# Patient Record
Sex: Male | Born: 1950
Health system: Southern US, Community
[De-identification: ages and names within clinical notes are randomized; demographics above are authoritative.]

## PROBLEM LIST (undated history)

## (undated) DIAGNOSIS — E119 Type 2 diabetes mellitus without complications: Secondary | ICD-10-CM

## (undated) DIAGNOSIS — I509 Heart failure, unspecified: Secondary | ICD-10-CM

## (undated) DIAGNOSIS — J449 Chronic obstructive pulmonary disease, unspecified: Secondary | ICD-10-CM

## (undated) DIAGNOSIS — I5020 Unspecified systolic (congestive) heart failure: Secondary | ICD-10-CM

## (undated) DIAGNOSIS — I83009 Varicose veins of unspecified lower extremity with ulcer of unspecified site: Secondary | ICD-10-CM

## (undated) DIAGNOSIS — N183 Chronic kidney disease, stage 3 unspecified: Secondary | ICD-10-CM

## (undated) DIAGNOSIS — U071 COVID-19: Secondary | ICD-10-CM

## (undated) DIAGNOSIS — L97909 Non-pressure chronic ulcer of unspecified part of unspecified lower leg with unspecified severity: Secondary | ICD-10-CM

## (undated) DIAGNOSIS — I2510 Atherosclerotic heart disease of native coronary artery without angina pectoris: Secondary | ICD-10-CM

## (undated) DIAGNOSIS — I447 Left bundle-branch block, unspecified: Secondary | ICD-10-CM

## (undated) HISTORY — PX: OTHER SURGICAL HISTORY: SHX169

## (undated) HISTORY — DX: Heart failure, unspecified: I50.9

---

## 2006-08-08 ENCOUNTER — Other Ambulatory Visit: Payer: Self-pay

## 2006-08-08 ENCOUNTER — Observation Stay: Payer: Self-pay | Admitting: Internal Medicine

## 2008-10-20 ENCOUNTER — Emergency Department: Payer: Self-pay | Admitting: Internal Medicine

## 2013-10-27 ENCOUNTER — Observation Stay: Payer: Self-pay | Admitting: Internal Medicine

## 2013-10-27 LAB — COMPREHENSIVE METABOLIC PANEL
ALBUMIN: 4 g/dL (ref 3.4–5.0)
ANION GAP: 9 (ref 7–16)
Alkaline Phosphatase: 120 U/L — ABNORMAL HIGH
BUN: 30 mg/dL — ABNORMAL HIGH (ref 7–18)
Bilirubin,Total: 0.3 mg/dL (ref 0.2–1.0)
CHLORIDE: 104 mmol/L (ref 98–107)
Calcium, Total: 9.7 mg/dL (ref 8.5–10.1)
Co2: 26 mmol/L (ref 21–32)
Creatinine: 2.12 mg/dL — ABNORMAL HIGH (ref 0.60–1.30)
EGFR (African American): 38 — ABNORMAL LOW
EGFR (Non-African Amer.): 33 — ABNORMAL LOW
Glucose: 116 mg/dL — ABNORMAL HIGH (ref 65–99)
OSMOLALITY: 285 (ref 275–301)
POTASSIUM: 4 mmol/L (ref 3.5–5.1)
SGOT(AST): 36 U/L (ref 15–37)
SGPT (ALT): 20 U/L (ref 12–78)
Sodium: 139 mmol/L (ref 136–145)
Total Protein: 8.1 g/dL (ref 6.4–8.2)

## 2013-10-27 LAB — URINALYSIS, COMPLETE
BILIRUBIN, UR: NEGATIVE
Bacteria: NONE SEEN
Hyaline Cast: 3
KETONE: NEGATIVE
LEUKOCYTE ESTERASE: NEGATIVE
NITRITE: NEGATIVE
PH: 7 (ref 4.5–8.0)
Protein: 100
RBC,UR: <1 /HPF (ref 0–5)
Specific Gravity: 1.018 (ref 1.003–1.030)
Squamous Epithelial: NONE SEEN
WBC UR: <1 /HPF (ref 0–5)

## 2013-10-27 LAB — CBC WITH DIFFERENTIAL/PLATELET
BASOS ABS: 0.1 10*3/uL (ref 0.0–0.1)
Basophil %: 0.5 %
Eosinophil #: 0.1 10*3/uL (ref 0.0–0.7)
Eosinophil %: 1.2 %
HCT: 41.7 % (ref 40.0–52.0)
HGB: 13.4 g/dL (ref 13.0–18.0)
LYMPHS PCT: 13 %
Lymphocyte #: 1.6 10*3/uL (ref 1.0–3.6)
MCH: 30.6 pg (ref 26.0–34.0)
MCHC: 32.1 g/dL (ref 32.0–36.0)
MCV: 95 fL (ref 80–100)
MONOS PCT: 6.3 %
Monocyte #: 0.8 x10 3/mm (ref 0.2–1.0)
Neutrophil #: 9.6 10*3/uL — ABNORMAL HIGH (ref 1.4–6.5)
Neutrophil %: 79 %
Platelet: 274 10*3/uL (ref 150–440)
RBC: 4.37 10*6/uL — ABNORMAL LOW (ref 4.40–5.90)
RDW: 13.2 % (ref 11.5–14.5)
WBC: 12.1 10*3/uL — ABNORMAL HIGH (ref 3.8–10.6)

## 2013-10-27 LAB — PROTIME-INR
INR: 0.9
PROTHROMBIN TIME: 11.9 s (ref 11.5–14.7)

## 2013-10-27 LAB — CK TOTAL AND CKMB (NOT AT ARMC)
CK, TOTAL: 177 U/L
CK-MB: 8.1 ng/mL — AB (ref 0.5–3.6)

## 2013-10-27 LAB — TROPONIN I

## 2013-10-28 LAB — CBC WITH DIFFERENTIAL/PLATELET
BASOS ABS: 0.1 10*3/uL (ref 0.0–0.1)
BASOS PCT: 0.9 %
EOS PCT: 3.5 %
Eosinophil #: 0.3 10*3/uL (ref 0.0–0.7)
HCT: 38.5 % — AB (ref 40.0–52.0)
HGB: 12.8 g/dL — AB (ref 13.0–18.0)
LYMPHS ABS: 2.3 10*3/uL (ref 1.0–3.6)
Lymphocyte %: 31.2 %
MCH: 31.6 pg (ref 26.0–34.0)
MCHC: 33.2 g/dL (ref 32.0–36.0)
MCV: 95 fL (ref 80–100)
MONOS PCT: 8.9 %
Monocyte #: 0.6 x10 3/mm (ref 0.2–1.0)
NEUTROS ABS: 4 10*3/uL (ref 1.4–6.5)
NEUTROS PCT: 55.5 %
PLATELETS: 221 10*3/uL (ref 150–440)
RBC: 4.05 10*6/uL — AB (ref 4.40–5.90)
RDW: 13 % (ref 11.5–14.5)
WBC: 7.2 10*3/uL (ref 3.8–10.6)

## 2013-10-28 LAB — BASIC METABOLIC PANEL
ANION GAP: 7 (ref 7–16)
BUN: 33 mg/dL — ABNORMAL HIGH (ref 7–18)
CALCIUM: 9 mg/dL (ref 8.5–10.1)
CHLORIDE: 106 mmol/L (ref 98–107)
CO2: 26 mmol/L (ref 21–32)
Creatinine: 1.82 mg/dL — ABNORMAL HIGH (ref 0.60–1.30)
EGFR (Non-African Amer.): 39 — ABNORMAL LOW
GFR CALC AF AMER: 46 — AB
GLUCOSE: 127 mg/dL — AB (ref 65–99)
OSMOLALITY: 286 (ref 275–301)
POTASSIUM: 4.5 mmol/L (ref 3.5–5.1)
Sodium: 139 mmol/L (ref 136–145)

## 2013-10-28 LAB — HEMOGLOBIN A1C: HEMOGLOBIN A1C: 8 % — AB (ref 4.2–6.3)

## 2014-01-07 ENCOUNTER — Inpatient Hospital Stay: Payer: Self-pay | Admitting: Internal Medicine

## 2014-01-07 LAB — CBC
HCT: 41.5 % (ref 40.0–52.0)
HGB: 13.4 g/dL (ref 13.0–18.0)
MCH: 31 pg (ref 26.0–34.0)
MCHC: 32.2 g/dL (ref 32.0–36.0)
MCV: 96 fL (ref 80–100)
Platelet: 279 10*3/uL (ref 150–440)
RBC: 4.3 10*6/uL — ABNORMAL LOW (ref 4.40–5.90)
RDW: 12.8 % (ref 11.5–14.5)
WBC: 13.6 10*3/uL — ABNORMAL HIGH (ref 3.8–10.6)

## 2014-01-07 LAB — COMPREHENSIVE METABOLIC PANEL
ALK PHOS: 129 U/L — AB
ALT: 21 U/L (ref 12–78)
ANION GAP: 6 — AB (ref 7–16)
Albumin: 4 g/dL (ref 3.4–5.0)
BUN: 22 mg/dL — ABNORMAL HIGH (ref 7–18)
Bilirubin,Total: 0.2 mg/dL (ref 0.2–1.0)
CHLORIDE: 104 mmol/L (ref 98–107)
CREATININE: 1.95 mg/dL — AB (ref 0.60–1.30)
Calcium, Total: 9.4 mg/dL (ref 8.5–10.1)
Co2: 29 mmol/L (ref 21–32)
EGFR (African American): 42 — ABNORMAL LOW
EGFR (Non-African Amer.): 36 — ABNORMAL LOW
Glucose: 114 mg/dL — ABNORMAL HIGH (ref 65–99)
OSMOLALITY: 282 (ref 275–301)
POTASSIUM: 4.3 mmol/L (ref 3.5–5.1)
SGOT(AST): 31 U/L (ref 15–37)
Sodium: 139 mmol/L (ref 136–145)
TOTAL PROTEIN: 8.4 g/dL — AB (ref 6.4–8.2)

## 2014-01-07 LAB — URINALYSIS, COMPLETE
BILIRUBIN, UR: NEGATIVE
Bacteria: NONE SEEN
Blood: NEGATIVE
Glucose,UR: >=500 mg/dL (ref 0–75)
Ketone: NEGATIVE
LEUKOCYTE ESTERASE: NEGATIVE
NITRITE: NEGATIVE
PH: 6 (ref 4.5–8.0)
Protein: >=500
Specific Gravity: 1.015 (ref 1.003–1.030)
Squamous Epithelial: NONE SEEN

## 2014-01-07 LAB — TROPONIN I

## 2014-01-07 LAB — TSH: Thyroid Stimulating Horm: 7.87 u[IU]/mL — ABNORMAL HIGH

## 2014-01-07 LAB — T4, FREE: Free Thyroxine: 1.08 ng/dL (ref 0.76–1.46)

## 2014-01-08 LAB — BASIC METABOLIC PANEL
Anion Gap: 5 — ABNORMAL LOW (ref 7–16)
BUN: 23 mg/dL — ABNORMAL HIGH (ref 7–18)
CHLORIDE: 105 mmol/L (ref 98–107)
Calcium, Total: 8.6 mg/dL (ref 8.5–10.1)
Co2: 26 mmol/L (ref 21–32)
Creatinine: 1.92 mg/dL — ABNORMAL HIGH (ref 0.60–1.30)
GFR CALC AF AMER: 43 — AB
GFR CALC NON AF AMER: 37 — AB
GLUCOSE: 391 mg/dL — AB (ref 65–99)
Osmolality: 292 (ref 275–301)
POTASSIUM: 4.5 mmol/L (ref 3.5–5.1)
Sodium: 136 mmol/L (ref 136–145)

## 2014-01-12 LAB — CULTURE, BLOOD (SINGLE)

## 2014-10-19 NOTE — Discharge Summary (Signed)
PATIENT NAME:  Bryce Garcia, Bryce Garcia MR#:  [id number] DATE OF BIRTH:  1951-02-06  DATE OF ADMISSION:  10/27/2013 DATE OF DISCHARGE:  10/28/2013  DISCHARGE DIAGNOSES:  1.  Hypoglycemia, resolved.  2.  Diabetes mellitus, type 2. 3.  Hypertension.  4.  Peripheral vascular disease. 5.  Chronic kidney disease stage III.  DISCHARGE MEDICATIONS: 1.  Aspirin 81 mg p.o. daily. 2.  Synthroid 50 mcg p.o. daily. 3.  Cymbalta 60 mg p.o. daily. 4.  Wellbutrin 150 mg p.o. daily. 5.  Enalapril 20 mg 1 tablet p.o. b.i.d.  6.  Furosemide 20 mg p.o. daily. 7.  Humalog according to sliding scale with coverage.   DIET: Low-sodium, low-fat, ADA.  CONSULTANTS: None.  HOSPITAL COURSE:  1.  This is a 64 year old male patient with history of hypertension and diabetes mellitus type 2 came in because of hypoglycemia. The patient's blood sugar was 30 when he was brought in. The patient was brought in by the wife because he was unresponsive. The patient was sitting in the recliner and he became unresponsive. By the time EMS went there, his sugar was in the 30s. Because of that, the patient came to the Emergency Room. He had a CAT scan of abdomen and pelvis which showed no abnormal changes, and the patient admitted because of hypoglycemia. The patient did not have any nausea or vomiting. He takes Novolin N at home twice a day. The patient says that he has labile blood glucose levels. He has this problem all the time, but he had hypoglycemia which persisted so we admitted him to hospitalist service, started on D5 half normal saline, and checked the Accu-Cheks every 4 hours. He was not on Lantus, but on admission Lantus was mentioned in the history and physical, but he told me that he is not on Lantus. He takes Novolin N. So anyway we did not give any long-acting insulin while he was here. The patient's blood sugars were followed closely. The patient's sugars improved and we stopped the IV fluids from D5 and then the patient's  sugar jumped up all the way to like 400s. He required some sliding scale coverage, and the patient's sugars were as high as 600, so we had to cover him with short-acting insulin. The patient also received a dose of Lantus by night physician. He received a dose of Lantus the second night. He received 12 units. The patient developed hypoglycemia with sugar of 18. That happened after 2 hours of getting Lantus. The patient's hypoglycemia quickly corrected and he improved, hypoglycemia resolved, and repeat blood sugars were like 160s. I told the patient that he needs to follow up with endocrinologist to have correct regimen for his diabetes. According to him, he has been having these labile sugars on and off. They will go either high or very low, so I told him to do sliding scale coverage alone and see the primary doctor, Dr. Deborra Medina, and then also see Dr. Lavone Orn, and see if the patient needs further adjustment of his medications.  2.  Acute on chronic renal failure. The patient's creatinine was 2.12 and BUN 30 on admission. He received IV fluids. At the time of discharge, the patient's BUN and creatinine, BUN was 33 and creatinine was 1.82. The patient denies any other complaints.  DISCHARGE PHYSICAL EXAMINATION:  VITAL SIGNS: On the day of discharge, blood pressure was 177/80, pulse 74, temperature 98. The patient's blood pressure has been around 120/60s to 150/70s. CARDIOVASCULAR: S1, S2 regular.  LUNGS:  Clear to auscultation. No wheeze noted.  ABDOMEN: Soft, nontender, and nondistended. Bowel sounds normal. EXTREMITIES: No edema.   The patient was stable for discharge. We will discharge him home.   TIME SPENT: More than 30 minutes.   ____________________________ Epifanio Lesches, MD sk:sb D: 10/29/2013 13:30:00 ET T: 10/29/2013 13:51:14 ET JOB#: [id number]  cc: Epifanio Lesches, MD, <Dictator> Epifanio Lesches MD ELECTRONICALLY SIGNED 11/12/2013 12:08

## 2014-10-19 NOTE — H&P (Signed)
PATIENT NAME:  Bryce Garcia, DEUTSCHER MR#:  [id number] DATE OF BIRTH:  Nov 06, 1950  DATE OF ADMISSION:  01/07/2014  PRIMARY CARE PHYSICIAN: Dr. Verner Chol at [facility].   CHIEF COMPLAINT: Syncope.   HISTORY OF PRESENT ILLNESS: This is a 64 year old male who was found at the gas station with his foot on the accelerator, passed out in his car. EMS was called by the bystander who found him, where he was noted to have a blood sugar of 38. Of note, he was here in May with a similar episode of hypoglycemia. He is currently seeing an endocrinologist at [facility]. He is on sliding scale insulin per carbohydrates. The patient was noted to have hypothermia upon arrival, temperature was 93 upon arrival to the ER. He now has a bear hugger on. His blood sugars after an amp of D50 did elevate; however, they keep fluctuating and now his blood sugars are in the 60s. He is currently on a D5 drip.   REVIEW OF SYSTEMS: CONSTITUTIONAL: No fever although here he was noted to have hypothermia. No fatigue, weakness.  EYES: No blurred or double vision, glaucoma or cataract. ENT: No ear pain, hearing loss, snoring, postnasal drip.  RESPIRATORY:  No cough, wheezing, hemoptysis, chronic obstructive pulmonary disease.  CARDIOVASCULAR: No chest pain, palpitations. Positive syncope. No edema, arrhythmia, dyspnea on exertion.  GASTROINTESTINAL: No nausea, vomiting, diarrhea, abdominal pain, melena or ulcers.  GENITOURINARY: No dysuria or hematuria.  ENDOCRINE:  No polyuria, polydipsia. HEMATOLOGY AND LYMPHATIC:  No bleeding or swollen glands.  SKIN: No rash or lesions.   MUSCULOSKELETAL: No limited activity. NEUROLOGICAL:  Positive history of cerebrovascular accident.  No history of transient ischemic attack or seizures.   PSYCHIATRIC:  No history of anxiety or depression.   PAST MEDICAL HISTORY: 1.  Diabetes.  2.  History of hypothyroidism.  3.  Coronary artery disease, status post bypass.  4.  History of cerebrovascular accident affecting  the left side of body.  5.  Herniated disk.  6.  Osteoarthritis.   ALLERGIES: No known drug allergies.   MEDICATIONS:  1.  Aspirin 81 mg daily.  2.  Synthroid 50 mcg daily.  3.  Cymbalta 60 mg daily.  4.  Wellbutrin 150 mg daily.  5.  Enalapril 20 mg b.i.d.  6.  Lasix 20 mg daily.  7.  Humalog sliding scale.   SOCIAL HISTORY: No tobacco, alcohol or drug use.   FAMILY HISTORY: Positive for diabetes, hypertension.   PHYSICAL EXAMINATION: VITAL SIGNS: Temperature 95.8, pulse 66, respirations 19, blood pressure 147/58, 100% on room air.  GENERAL: The patient is alert, oriented, not in acute distress. HEENT: Head is atraumatic. Pupils are round and reactive. Sclerae anicteric. Mucous membranes are moist. Oropharynx is clear. NECK: Supple without JVD, carotid bruit or enlarged thyroid.  HEART: Regular rate and rhythm. No murmurs, gallops or rubs. PMI is not displaced. LUNGS: Clear to auscultation without crackles, rales, rhonchi or wheezing. Normal to percussion.  ABDOMEN: Bowel sounds present. Nontender, nondistended. No hepatosplenomegaly. No rebound or guarding.   EXTREMITIES: No clubbing, cyanosis or edema.  SKIN: He has got bilateral red chronic erythema of his lower extremities. This is not new.  NEUROLOGICAL:  Cranial nerves II through XII are intact.  There are no focal deficits.   LABORATORY, DIAGNOSTIC AND RADIOLOGICAL DATA:   1.  Sodium 139, potassium 4.3, chloride 104, bicarbonate 29, BUN 22, creatinine 1.95, glucose 64 currently on D5.  2.  Total protein 8.4, albumin 4.0, bilirubin 0.2, alkaline phosphatase 129,  AST 31, ALT 21.  3.  Troponin less than 0.02.  4.  TSH 7.87, T4 is 1.08.  5.  White blood cells 13, hemoglobin 13.4, hematocrit 42, platelets are 279.  6.  Lactic acid is 2.8.   ASSESSMENT AND PLAN: This is a 64 year old male who presents with hypothermia and hypoglycemia after a syncopal episode.   1.  Syncope secondary to hypoglycemia as outlined below.  2.   Syncope. The patient was noted to have a blood sugar of 38 which contributed to his syncopal episode. He is on D5 drip.  Will continue to monitor his blood sugars q.2 hours for now. Once his blood sugars stabilize, we can wean him off the D5.  3.  Chronic renal insufficiency. Patient is right around baseline creatinine, stage 3 at 1.95.  4.  Hypothermia, likely secondary to hypoglycemia. The patient does have elevated lactic acid which may be indicative of sepsis. We will empirically place the patient on Rocephin. I have asked the ER physician to order some blood cultures. Continue with the bear hugger and will follow up on the urinalysis to evaluate for any infection.  5.  Hypothyroidism. The patient's TSH is elevated however, free T4 is normal. Will continue Synthroid 50 mcg daily.  6.  History of coronary artery disease. We will continue with aspirin as well as use ACE inhibitor.  7.  The patient is full CODE STATUS.   TIME SPENT: Approximately 40 minutes.    ____________________________ Donell Beers. Benjie Karvonen, MD spm:cs D: 01/07/2014 19:01:00 ET T: 01/07/2014 19:29:29 ET JOB#: [id number]  cc: Shareena Nusz P. Benjie Karvonen, MD, <Dictator> Dr. Verner Chol at [place] Minette Manders MD ELECTRONICALLY SIGNED 01/08/2014 11:33

## 2014-10-19 NOTE — Discharge Summary (Signed)
PATIENT NAME:  Bryce Garcia, POPKO MR#:  [id number] DATE OF BIRTH:  21-Nov-1950  DATE OF ADMISSION:  01/07/2014. DATE OF DISCHARGE:  01/08/2014.  DISCHARGE DIAGNOSES:  1.  Syncope secondary to hypoglycemia, resolved.  2.  Hypertension.  3.  Hypothyroidism.  4.  Depression.   DISCHARGE MEDICATIONS: Lasix 20 mg p.o. daily, Humalog sliding scale, aspirin 81 mg daily, Cymbalta 60 mg p.o. daily, (Enalapril > 20 mg p.o. b.i.d., Synthroid 50 mcg p.o. daily, Wellbutrin XL 150 mg p.o. b.i.d.   DIET: Low-sodium, ADA diet.    CONSULTATIONS:  Dietitian consult.   HOSPITAL COURSE: The patient is a 64 year old male with history of diabetes mellitus type 2 who follows up  at [facility] comes in because of syncope. Patient was at a gas station and he was accelerating with foot on the accelerator and the patient subsequently lost consciousness and EMS brought him here. Sugar was 38 when he came. He sees a [facility] endocrinologist and according to them the patient is usually very strict about glucose control and does only sliding scale coverage.  The patient takes insulin according to the carbohydrates that he takes in. The patient was started on D5 drip and he was admitted to the medical service. He had no other abnormalities and his physical examination was normal. Patient's syncope thought to be secondary to hypoglycemia rather than any acute abnormalities  and any other metabolic derangements.  The patient's workup showed no evidence of infection. The patient's sugars improved on D5 IV F so we stopped the D5IVF  and did the sliding scale coverage and the patient was seen by dietitian and then the diabetes nurse saw the patient and she recommended outpatient referral for [facility] regarding his disease process and also taking insulin according to the carbohydrate count. The patient told me that he took regular insulin along with Humulin N 75/25 yesterday morning and then he passed out in the evening. I told the patient to  keep a log of insulin and the sugar that he had and how much insulin he took and show it to the endocrinologist when he goes next time.    DISCHARGE VITAL SIGNS: Temp 98.1, blood pressure 170/76, heart rate 70, oxygen saturation 98% on room air. He was hemodynamically stable.   Physical examination was within normal limits at the time of discharge.   TIME SPENT:  More than 30 minutes.   ____________________________ Epifanio Lesches, MD sk:lt D: 01/09/2014 22:34:38 ET T: 01/10/2014 07:01:23 ET JOB#: [id number]  cc: Epifanio Lesches, MD, <Dictator> Epifanio Lesches MD ELECTRONICALLY SIGNED 01/23/2014 13:33

## 2014-10-19 NOTE — H&P (Signed)
PATIENT NAME:  Bryce Garcia, Bryce Garcia MR#:  [id number] DATE OF BIRTH:  1951-02-18  DATE OF ADMISSION:  10/27/2013  PRIMARY CARE PHYSICIAN:  Nonlocal.   REFERRING PHYSICIAN:  Dr. Marjean Donna.   CHIEF COMPLAINT:  Hypoglycemia.   HISTORY OF PRESENT ILLNESS:  Bryce Garcia is a 64 year old male with history of insulin-dependent diabetes mellitus, is brought to the Emergency Department with hypoglycemia.  The patient was sitting in the recliner.  The patient's wife found him to be unresponsive, called EMS.  When EMS arrived the patient had low blood sugars in the 30s.  The patient was given D50 and was brought to the Emergency Department.  The patient continues to have some trend down of the glucose.  Concerning this, the decision is made to admit the patient to the hospital.  In addition, ER physicians felt concerned about his arm, thought it was mottled; however, the patient states it is his baseline from tanning.  The patient received a CT abdomen and pelvis with contrast.  The patient has a baseline creatinine of 2.12.  The patient is currently receiving IV fluids.  The patient states that he has been having frequent episodes of hypoglycemia.  Denies any nausea, vomiting and diarrhea.  The patient was recently diagnosed with bronchitis and has been currently receiving antibiotics and inhalers.   PAST MEDICAL HISTORY: 1.  Hypothyroidism.  2.  Diabetes mellitus, insulin-dependent.  3.  Coronary artery disease, status post coronary artery bypass grafting.  4.  History of CVA affecting the left side of the body.  5.  Herniated disk.  6.  Osteoarthritis.   ALLERGIES:  No known drug allergies.   HOME MEDICATIONS:  1.  Wellbutrin XL 150 mg daily.  2.  Toprol-XL 25 mg daily.  3.  Synthroid 50 mcg daily.  4.  Plavix 75 mg daily. 5.  Lipitor 40 mg daily.  6.  Lantus.  7.  Hydrochlorothiazide 25 mg daily.  8.  Gabapentin 300 mg.  9.   20 mg daily.  10.  Cymbalta 60 mg daily.  11.  Aspirin 81 mg daily.    SOCIAL HISTORY:  Former smoker.  Denies drinking alcohol or using illicit drugs.  Married, lives with his wife.   FAMILY HISTORY:  Diabetes mellitus.  Hypertension in mother.   REVIEW OF SYSTEMS:  CONSTITUTIONAL:  Generalized weakness.  EYES:  No change in vision.  EARS, NOSE, THROAT:  No change in hearing.  RESPIRATORY:  Has been having mild cough and shortness of breath.  CARDIOVASCULAR:  No chest pain, palpitations.  GASTROINTESTINAL:  No nausea, vomiting, abdominal pain.  GENITOURINARY:  No dysuria or hematuria. HEMATOLOGIC:  No easy bruising or bleeding.  SKIN:  No rashes or lesions.  ENDOCRINE:  Has diagnosis of hypothyroidism and diabetes mellitus.  NEUROLOGIC:  No weakness or numbness in any part of the body.   PHYSICAL EXAMINATION: GENERAL:  This is a well-built, well-nourished, age-appropriate male lying down in the bed, not in distress.  VITAL SIGNS:  Temperature 96.6, pulse 77, blood pressure 184/79, respiratory rate of 16, oxygen saturation is 96% on room air.  HEENT:  Head normocephalic, atraumatic.  Eyes, no scleral icterus.  Conjunctivae normal.  Pupils equal and react to light.  Extraocular movements are intact.  Mucous membranes moist.  No pharyngeal erythema.  NECK:  Supple.  No lymphadenopathy.  No JVD.  No carotid bruit.  CHEST:  Has no focal tenderness.  LUNGS:  Bilateral clear to auscultation.  HEART:  S1, S2 regular.  No murmurs are heard.  ABDOMEN:  Bowel sounds plus.  Soft, nontender, nondistended.  No hepatosplenomegaly.  EXTREMITIES:  No pedal edema.  Pulses 2+.  NEUROLOGIC:  The patient is alert, oriented to place, person, and time.  Cranial nerves II through XII intact.  Motor 5 by 5 in upper and lower extremities.  MUSCULOSKELETAL:  Good range of motion in all the extremities.  SKIN:  No rashes or lesions.   LABORATORY DATA:  Chest x-ray, one view portable:  No evidence of acute cardiopulmonary disease.  CT angiogram of the chest, abdomen and pelvis,  no evidence of aneurysm or dilatation or dissection.  Faint tree and bud nodularity prominent in the upper lobe, favored to reflect smoking related disease.   Troponin less than 0.02.  CMP:  BUN 30, creatinine of 2.12.  The rest of all the values are within normal limits.   CBC:  WBC of 12.1.  The rest of all the values are within normal limits.   ASSESSMENT AND PLAN:  Bryce Garcia is a 64 year old male who comes to the Emergency Department with an episode of hypoglycemia.  1.  Hypoglycemia.  Chronic kidney disease could be contributing to this with a delayed clearance of the insulin.  We will decrease the dose of the Lantus.  Follow up.  We will continue the D5 half normal saline for now.  Continue to follow up with Accu-Cheks every 4 hours.  2.  Hypertension, poorly controlled.  Continue the home medications and follow up.  3.  Chronic kidney disease.  We do not have any the patient's baseline.  We will continue with IV fluids and follow up.  4.  Hypertension, poorly controlled.  Continue with the home medications.  5.  Keep the patient on deep vein thrombosis prophylaxis with Lovenox.   TIME SPENT:  50 minutes.    ____________________________ Monica Becton, MD pv:ea D: 10/27/2013 02:52:04 ET T: 10/27/2013 03:33:35 ET JOB#: [id number]  cc: Monica Becton, MD, <Dictator> Monica Becton MD ELECTRONICALLY SIGNED 10/29/2013 20:57

## 2016-01-15 ENCOUNTER — Emergency Department
Admission: EM | Admit: 2016-01-15 | Discharge: 2016-01-16 | Disposition: A | Discharge status: Home / Self Care | Payer: Medicare Other | Attending: Emergency Medicine | Admitting: Emergency Medicine

## 2016-01-15 DIAGNOSIS — E162 Hypoglycemia, unspecified: Secondary | ICD-10-CM

## 2016-01-15 DIAGNOSIS — E11649 Type 2 diabetes mellitus with hypoglycemia without coma: Secondary | ICD-10-CM | POA: Insufficient documentation

## 2016-01-15 HISTORY — DX: Type 2 diabetes mellitus without complications: E11.9

## 2016-01-15 LAB — COMPREHENSIVE METABOLIC PANEL
ALBUMIN: 4.2 g/dL (ref 3.5–5.0)
ALT: 18 U/L (ref 17–63)
ANION GAP: 10 (ref 5–15)
AST: 25 U/L (ref 15–41)
Alkaline Phosphatase: 110 U/L (ref 38–126)
BILIRUBIN TOTAL: 0.7 mg/dL (ref 0.3–1.2)
BUN: 41 mg/dL — ABNORMAL HIGH (ref 6–20)
CO2: 25 mmol/L (ref 22–32)
Calcium: 9.6 mg/dL (ref 8.9–10.3)
Chloride: 103 mmol/L (ref 101–111)
Creatinine, Ser: 2.57 mg/dL — ABNORMAL HIGH (ref 0.61–1.24)
GFR calc Af Amer: 29 mL/min — ABNORMAL LOW (ref >60)
GFR, EST NON AFRICAN AMERICAN: 25 mL/min — AB (ref >60)
Glucose, Bld: 96 mg/dL (ref 65–99)
POTASSIUM: 4.8 mmol/L (ref 3.5–5.1)
Sodium: 138 mmol/L (ref 135–145)
Total Protein: 7.3 g/dL (ref 6.5–8.1)

## 2016-01-15 LAB — CBC WITH DIFFERENTIAL/PLATELET
BASOS ABS: 0.1 10*3/uL (ref 0–0.1)
BASOS PCT: 1 %
Eosinophils Absolute: 0.2 10*3/uL (ref 0–0.7)
Eosinophils Relative: 2 %
HEMATOCRIT: 38 % — AB (ref 40.0–52.0)
HEMOGLOBIN: 12.9 g/dL — AB (ref 13.0–18.0)
Lymphocytes Relative: 10 %
Lymphs Abs: 1.2 10*3/uL (ref 1.0–3.6)
MCH: 31.4 pg (ref 26.0–34.0)
MCHC: 34 g/dL (ref 32.0–36.0)
MCV: 92.1 fL (ref 80.0–100.0)
Monocytes Absolute: 0.8 10*3/uL (ref 0.2–1.0)
Monocytes Relative: 7 %
NEUTROS ABS: 9.8 10*3/uL — AB (ref 1.4–6.5)
NEUTROS PCT: 80 %
Platelets: 224 10*3/uL (ref 150–440)
RBC: 4.13 MIL/uL — ABNORMAL LOW (ref 4.40–5.90)
RDW: 13.9 % (ref 11.5–14.5)
WBC: 12.1 10*3/uL — ABNORMAL HIGH (ref 3.8–10.6)

## 2016-01-15 LAB — GLUCOSE, CAPILLARY
GLUCOSE-CAPILLARY: 104 mg/dL — AB (ref 65–99)
GLUCOSE-CAPILLARY: 117 mg/dL — AB (ref 65–99)
Glucose-Capillary: 100 mg/dL — ABNORMAL HIGH (ref 65–99)
Glucose-Capillary: 196 mg/dL — ABNORMAL HIGH (ref 65–99)

## 2016-01-15 LAB — CK: Total CK: 67 U/L (ref 49–397)

## 2016-01-15 LAB — TROPONIN I

## 2016-01-15 MED ORDER — SODIUM CHLORIDE 0.9 % IV BOLUS (SEPSIS)
500.0000 mL | Freq: Once | INTRAVENOUS | Status: AC
  Administered 2016-01-15: 500 mL via INTRAVENOUS

## 2016-01-15 NOTE — ED Notes (Signed)
Pt found at home by wife and EMS called.  Pt was passed out for approximately 2 hours.  Upon arrival EMS states blood sugar was 46 and came up to 214 after amp of D50.  Pt then able to drink some orange juice, but within 10 minutes blood sugar back down to 144.  EMS reports pt never "bounced back" and never returned to baseline per family.  Pt alert, but very quiet and slow to answer questions.  Per patient he takes a long acting insulin of 6units in AM and 18 units in the evening.

## 2016-01-15 NOTE — ED Notes (Signed)
CBG 134 

## 2016-01-15 NOTE — Discharge Instructions (Signed)
Hypoglycemia Stay with your family. Check your sugar regularly. If you have any new worrisome symptoms, take insulin. If that does not help, call 911.   Hypoglycemia occurs when the glucose in your blood is too low. Glucose is a type of sugar that is your body's main energy source. Hormones, such as insulin and glucagon, control the level of glucose in the blood. Insulin lowers blood glucose and glucagon increases blood glucose. Having too much insulin in your blood stream, or not eating enough food containing sugar, can result in hypoglycemia. Hypoglycemia can happen to people with or without diabetes. It can develop quickly and can be a medical emergency.  CAUSES   Missing or delaying meals.  Not eating enough carbohydrates at meals.  Taking too much diabetes medicine.  Not timing your oral diabetes medicine or insulin doses with meals, snacks, and exercise.  Nausea and vomiting.  Certain medicines.  Severe illnesses, such as hepatitis, kidney disorders, and certain eating disorders.  Increased activity or exercise without eating something extra or adjusting medicines.  Drinking too much alcohol.  A nerve disorder that affects body functions like your heart rate, blood pressure, and digestion (autonomic neuropathy).  A condition where the stomach muscles do not function properly (gastroparesis). Therefore, medicines and food may not absorb properly.  Rarely, a tumor of the pancreas can produce too much insulin. SYMPTOMS   Hunger.  Sweating (diaphoresis).  Change in body temperature.  Shakiness.  Headache.  Anxiety.  Lightheadedness.  Irritability.  Difficulty concentrating.  Dry mouth.  Tingling or numbness in the hands or feet.  Restless sleep or sleep disturbances.  Altered speech and coordination.  Change in mental status.  Seizures or prolonged convulsions.  Combativeness.  Drowsiness (lethargic).  Weakness.  Increased heart rate or  palpitations.  Confusion.  Pale, gray skin color.  Blurred or double vision.  Fainting. DIAGNOSIS  A physical exam and medical history will be performed. Your caregiver may make a diagnosis based on your symptoms. Blood tests and other lab tests may be performed to confirm a diagnosis. Once the diagnosis is made, your caregiver will see if your signs and symptoms go away once your blood glucose is raised.  TREATMENT  Usually, you can easily treat your hypoglycemia when you notice symptoms.  Check your blood glucose. If it is less than 70 mg/dl, take one of the following:   3-4 glucose tablets.    cup juice.    cup regular soda.   1 cup skim milk.   -1 tube of glucose gel.   5-6 hard candies.   Avoid high-fat drinks or food that may delay a rise in blood glucose levels.  Do not take more than the recommended amount of sugary foods, drinks, gel, or tablets. Doing so will cause your blood glucose to go too high.   Wait 10-15 minutes and recheck your blood glucose. If it is still less than 70 mg/dl or below your target range, repeat treatment.   Eat a snack if it is more than 1 hour until your next meal.  There may be a time when your blood glucose may go so low that you are unable to treat yourself at home when you start to notice symptoms. You may need someone to help you. You may even faint or be unable to swallow. If you cannot treat yourself, someone will need to bring you to the hospital.  [place]  If you have diabetes, follow your diabetes management plan by:  Taking your medicines as directed.  Following your exercise plan.  Following your meal plan. Do not skip meals. Eat on time.  Testing your blood glucose regularly. Check your blood glucose before and after exercise. If you exercise longer or different than usual, be sure to check blood glucose more frequently.  Wearing your medical alert jewelry that says you have  diabetes.  Identify the cause of your hypoglycemia. Then, develop ways to prevent the recurrence of hypoglycemia.  Do not take a hot bath or shower right after an insulin shot.  Always carry treatment with you. Glucose tablets are the easiest to carry.  If you are going to drink alcohol, drink it only with meals.  Tell friends or family members ways to keep you safe during a seizure. This may include removing hard or sharp objects from the area or turning you on your side.  Maintain a healthy weight. SEEK MEDICAL CARE IF:   You are having problems keeping your blood glucose in your target range.  You are having frequent episodes of hypoglycemia.  You feel you might be having side effects from your medicines.  You are not sure why your blood glucose is dropping so low.  You notice a change in vision or a new problem with your vision. SEEK IMMEDIATE MEDICAL CARE IF:   Confusion develops.  A change in mental status occurs.  The inability to swallow develops.  Fainting occurs.   This information is not intended to replace advice given to you by your health care provider. Make sure you discuss any questions you have with your health care provider.   Document Released: 06/14/2005 Document Revised: 06/19/2013 Document Reviewed: 02/18/2015 Elsevier Interactive Patient Education [employer].

## 2016-01-15 NOTE — ED Provider Notes (Signed)
[facility] Emergency Department Provider Note  ____________________________________________   I have reviewed the triage vital signs and the nursing notes.   HISTORY  Chief Complaint Hypoglycemia    HPI Bryce Garcia is a 65 y.o. male states that he is a very brittle diabetic and has had trouble with his diabetes. Patient take his Lantus as he normally does, and then before lunch take 10 of insulin which she takes may have been too much. He was found with a CBG of 46 at his home. He had not been seen for a few hours. Patient does not believe he was down for longer than an hour. EMS corrected his sugar. Patient has been slowly coming back to his baseline. He has no other complaints. No chest pain or shortness of breath no nausea no vomiting no recent illness he felt fine until shortly after taking his insulin. He states he did not finish his lungs. He is not on any long-acting Stephane Maria's or other medications for his diabetes he does not a headache he does not have any focal numbness or weakness he does not have any tongue bite does not believe that he had a he remembers getting weak and woozy as a sugar drop.  \   Past Medical History  Diagnosis Date  . Diabetes mellitus without complication ([place])     There are no active problems to display for this patient.   No past surgical history on file.  No current outpatient prescriptions on file.  Allergies Review of patient's allergies indicates no known allergies.  No family history on file.  Social History Social History  Substance Use Topics  . Smoking status: Not on file  . Smokeless tobacco: Not on file  . Alcohol Use: Not on file    Review of Systems Constitutional: No fever/chills Eyes: No visual changes. ENT: No sore throat. No stiff neck no neck pain Cardiovascular: Denies chest pain. Respiratory: Denies shortness of breath. Gastrointestinal:   no vomiting.  No diarrhea.  No  constipation. Genitourinary: Negative for dysuria. Musculoskeletal: Negative lower extremity swelling Skin: Negative for rash. Neurological: Negative for headaches, focal weakness or numbness. 10-point ROS otherwise negative.  ____________________________________________   PHYSICAL EXAM:  VITAL SIGNS: ED Triage Vitals  Enc Vitals Group     BP 01/15/16 2004 157/66 mmHg     Pulse Rate 01/15/16 2004 60     Resp 01/15/16 2004 18     Temp 01/15/16 2004 93.3 F (34.1 C)     Temp Source 01/15/16 2004 Rectal     SpO2 01/15/16 2127 98 %     Weight 01/15/16 2004 182 lb (82.555 kg)     Height 01/15/16 2004 5\' 9"  (1.753 m)     Head Cir --      Peak Flow --      Pain Score 01/15/16 2010 6     Pain Loc --      Pain Edu? --      Excl. in [place]? --     Constitutional: Alert and oriented. Well appearing and in no acute distress. Eyes: Conjunctivae are normal. PERRL. EOMI. Head: Atraumatic. Nose: No congestion/rhinnorhea. Mouth/Throat: Mucous membranes are moist.  Oropharynx non-erythematous. Neck: No stridor.   Nontender with no meningismus Cardiovascular: Normal rate, regular rhythm. Grossly normal heart sounds.  Good peripheral circulation. Respiratory: Normal respiratory effort.  No retractions. Lungs CTAB. Abdominal: Soft and nontender. No distention. No guarding no rebound Back:  There is no focal tenderness or  step off there is no midline tenderness there are no lesions noted. there is no CVA tenderness Musculoskeletal: No lower extremity tenderness. No joint effusions, no DVT signs strong distal pulses no edema Neurologic:  Normal speech and language. No gross focal neurologic deficits are appreciated.  Skin:  Skin is warm, dry and intact. No rash noted. Psychiatric: Mood and affect are normal. Speech and behavior are normal.  ____________________________________________   LABS (all labs ordered are listed, but only abnormal results are displayed)  Labs Reviewed  GLUCOSE,  CAPILLARY - Abnormal; Notable for the following:    Glucose-Capillary 100 (*)    All other components within normal limits  COMPREHENSIVE METABOLIC PANEL - Abnormal; Notable for the following:    BUN 41 (*)    Creatinine, Ser 2.57 (*)    GFR calc non Af Amer 25 (*)    GFR calc Af Amer 29 (*)    All other components within normal limits  CBC WITH DIFFERENTIAL/PLATELET - Abnormal; Notable for the following:    WBC 12.1 (*)    RBC 4.13 (*)    Hemoglobin 12.9 (*)    HCT 38.0 (*)    Neutro Abs 9.8 (*)    All other components within normal limits  GLUCOSE, CAPILLARY - Abnormal; Notable for the following:    Glucose-Capillary 104 (*)    All other components within normal limits  GLUCOSE, CAPILLARY - Abnormal; Notable for the following:    Glucose-Capillary 117 (*)    All other components within normal limits  GLUCOSE, CAPILLARY - Abnormal; Notable for the following:    Glucose-Capillary 196 (*)    All other components within normal limits  CULTURE, BLOOD (ROUTINE X 2)  CULTURE, BLOOD (ROUTINE X 2)  TROPONIN I  CK  CBG MONITORING, ED  CBG MONITORING, ED   ____________________________________________  EKG  I personally interpreted any EKGs ordered by me or triage All right but otherwise block, rate 61 bpm, no acute change from prior ____________________________________________  RADIOLOGY  I reviewed any imaging ordered by me or triage that were performed during my shift and, if possible, patient and/or family made aware of any abnormal findings. ____________________________________________   PROCEDURES  Procedure(s) performed: None  Critical Care performed: None  ____________________________________________   INITIAL IMPRESSION / ASSESSMENT AND PLAN / ED COURSE  Pertinent labs & imaging results that were available during my care of the patient were reviewed by me and considered in my medical decision making (see chart for details).  Patient had a core temperature  that was low secondary to his profound but not likely overly prolonged hypoglycemia. His sugar corrected here is been able to eat or she is, his temperature is coming up with a bear hugger. Patient very much would prefer to go home. There is no other acute pathology noted today. His creatinine is somewhat up her giving him IV fluids now that his sugar has corrected. This is likely secondary to mild dehydration today as he states he has not been eating or drinking very much despite taking his insulin. He states he will be with family. Vital signs are reassuring. He can follow-up with his doctor tomorrow. We will observe him in the emergency room and reassess. No evidence of CVA or CAD this does not appear to be anything more, located at this time then hypoglycemia which she has had trouble with with his insulin before. He states he has not had any recent change in his medications but he sometimes varies insulin based  on what he thinks he needs. ____________________________________________   FINAL CLINICAL IMPRESSION(S) / ED DIAGNOSES  Final diagnoses:  None      This chart was dictated using voice recognition software.  Despite best efforts to proofread,  errors can occur which can change meaning.     Schuyler Amor, MD 01/15/16 [phone]

## 2016-01-16 NOTE — ED Notes (Signed)
Reviewed d/c instructions, follow-up care with pt. Informed pt that he needed to stay with family tonight per MD order. Pt verbalized understanding

## 2016-01-20 LAB — CULTURE, BLOOD (ROUTINE X 2)
CULTURE: NO GROWTH
CULTURE: NO GROWTH

## 2019-06-21 ENCOUNTER — Inpatient Hospital Stay: Payer: Medicare HMO

## 2019-06-21 ENCOUNTER — Inpatient Hospital Stay
Admission: EM | Admit: 2019-06-21 | Discharge: 2019-06-28 | DRG: 280 | Disposition: A | Discharge status: Home / Self Care | Payer: Medicare HMO | Attending: Family Medicine | Admitting: Family Medicine

## 2019-06-21 ENCOUNTER — Emergency Department: Payer: Medicare HMO

## 2019-06-21 ENCOUNTER — Other Ambulatory Visit: Payer: Self-pay

## 2019-06-21 ENCOUNTER — Inpatient Hospital Stay (HOSPITAL_COMMUNITY)
Admit: 2019-06-21 | Discharge: 2019-06-21 | Disposition: A | Discharge status: Home / Self Care | Payer: Medicare HMO | Attending: Internal Medicine | Admitting: Internal Medicine

## 2019-06-21 DIAGNOSIS — J9601 Acute respiratory failure with hypoxia: Secondary | ICD-10-CM

## 2019-06-21 DIAGNOSIS — E10622 Type 1 diabetes mellitus with other skin ulcer: Secondary | ICD-10-CM | POA: Diagnosis present

## 2019-06-21 DIAGNOSIS — E871 Hypo-osmolality and hyponatremia: Secondary | ICD-10-CM | POA: Diagnosis not present

## 2019-06-21 DIAGNOSIS — Z951 Presence of aortocoronary bypass graft: Secondary | ICD-10-CM

## 2019-06-21 DIAGNOSIS — E1022 Type 1 diabetes mellitus with diabetic chronic kidney disease: Secondary | ICD-10-CM | POA: Diagnosis present

## 2019-06-21 DIAGNOSIS — I255 Ischemic cardiomyopathy: Secondary | ICD-10-CM | POA: Diagnosis present

## 2019-06-21 DIAGNOSIS — Z79899 Other long term (current) drug therapy: Secondary | ICD-10-CM

## 2019-06-21 DIAGNOSIS — Z8619 Personal history of other infectious and parasitic diseases: Secondary | ICD-10-CM

## 2019-06-21 DIAGNOSIS — N186 End stage renal disease: Secondary | ICD-10-CM

## 2019-06-21 DIAGNOSIS — J449 Chronic obstructive pulmonary disease, unspecified: Secondary | ICD-10-CM | POA: Diagnosis present

## 2019-06-21 DIAGNOSIS — I252 Old myocardial infarction: Secondary | ICD-10-CM | POA: Diagnosis not present

## 2019-06-21 DIAGNOSIS — I2510 Atherosclerotic heart disease of native coronary artery without angina pectoris: Secondary | ICD-10-CM | POA: Diagnosis present

## 2019-06-21 DIAGNOSIS — N183 Chronic kidney disease, stage 3 unspecified: Secondary | ICD-10-CM

## 2019-06-21 DIAGNOSIS — J189 Pneumonia, unspecified organism: Secondary | ICD-10-CM

## 2019-06-21 DIAGNOSIS — Z66 Do not resuscitate: Secondary | ICD-10-CM | POA: Diagnosis present

## 2019-06-21 DIAGNOSIS — I83029 Varicose veins of left lower extremity with ulcer of unspecified site: Secondary | ICD-10-CM

## 2019-06-21 DIAGNOSIS — I5043 Acute on chronic combined systolic (congestive) and diastolic (congestive) heart failure: Secondary | ICD-10-CM | POA: Diagnosis present

## 2019-06-21 DIAGNOSIS — E1065 Type 1 diabetes mellitus with hyperglycemia: Secondary | ICD-10-CM | POA: Diagnosis present

## 2019-06-21 DIAGNOSIS — Z20828 Contact with and (suspected) exposure to other viral communicable diseases: Secondary | ICD-10-CM | POA: Diagnosis present

## 2019-06-21 DIAGNOSIS — Z8249 Family history of ischemic heart disease and other diseases of the circulatory system: Secondary | ICD-10-CM

## 2019-06-21 DIAGNOSIS — I5033 Acute on chronic diastolic (congestive) heart failure: Secondary | ICD-10-CM

## 2019-06-21 DIAGNOSIS — E109 Type 1 diabetes mellitus without complications: Secondary | ICD-10-CM | POA: Diagnosis not present

## 2019-06-21 DIAGNOSIS — Z833 Family history of diabetes mellitus: Secondary | ICD-10-CM

## 2019-06-21 DIAGNOSIS — Z8616 Personal history of COVID-19: Secondary | ICD-10-CM

## 2019-06-21 DIAGNOSIS — J9600 Acute respiratory failure, unspecified whether with hypoxia or hypercapnia: Secondary | ICD-10-CM | POA: Diagnosis present

## 2019-06-21 DIAGNOSIS — I447 Left bundle-branch block, unspecified: Secondary | ICD-10-CM | POA: Diagnosis present

## 2019-06-21 DIAGNOSIS — D631 Anemia in chronic kidney disease: Secondary | ICD-10-CM | POA: Diagnosis present

## 2019-06-21 DIAGNOSIS — I214 Non-ST elevation (NSTEMI) myocardial infarction: Secondary | ICD-10-CM | POA: Diagnosis present

## 2019-06-21 DIAGNOSIS — Z8349 Family history of other endocrine, nutritional and metabolic diseases: Secondary | ICD-10-CM

## 2019-06-21 DIAGNOSIS — I340 Nonrheumatic mitral (valve) insufficiency: Secondary | ICD-10-CM | POA: Diagnosis not present

## 2019-06-21 DIAGNOSIS — E785 Hyperlipidemia, unspecified: Secondary | ICD-10-CM | POA: Diagnosis present

## 2019-06-21 DIAGNOSIS — L97929 Non-pressure chronic ulcer of unspecified part of left lower leg with unspecified severity: Secondary | ICD-10-CM

## 2019-06-21 DIAGNOSIS — I130 Hypertensive heart and chronic kidney disease with heart failure and stage 1 through stage 4 chronic kidney disease, or unspecified chronic kidney disease: Principal | ICD-10-CM | POA: Diagnosis present

## 2019-06-21 DIAGNOSIS — D649 Anemia, unspecified: Secondary | ICD-10-CM

## 2019-06-21 DIAGNOSIS — Z7902 Long term (current) use of antithrombotics/antiplatelets: Secondary | ICD-10-CM

## 2019-06-21 DIAGNOSIS — I83028 Varicose veins of left lower extremity with ulcer other part of lower leg: Secondary | ICD-10-CM | POA: Diagnosis present

## 2019-06-21 DIAGNOSIS — N170 Acute kidney failure with tubular necrosis: Secondary | ICD-10-CM | POA: Diagnosis not present

## 2019-06-21 DIAGNOSIS — J4489 Other specified chronic obstructive pulmonary disease: Secondary | ICD-10-CM

## 2019-06-21 DIAGNOSIS — Z794 Long term (current) use of insulin: Secondary | ICD-10-CM

## 2019-06-21 DIAGNOSIS — Z955 Presence of coronary angioplasty implant and graft: Secondary | ICD-10-CM

## 2019-06-21 DIAGNOSIS — N179 Acute kidney failure, unspecified: Secondary | ICD-10-CM

## 2019-06-21 DIAGNOSIS — I5022 Chronic systolic (congestive) heart failure: Secondary | ICD-10-CM

## 2019-06-21 DIAGNOSIS — N171 Acute kidney failure with acute cortical necrosis: Secondary | ICD-10-CM

## 2019-06-21 DIAGNOSIS — N184 Chronic kidney disease, stage 4 (severe): Secondary | ICD-10-CM | POA: Diagnosis present

## 2019-06-21 DIAGNOSIS — Z7982 Long term (current) use of aspirin: Secondary | ICD-10-CM

## 2019-06-21 HISTORY — DX: Atherosclerotic heart disease of native coronary artery without angina pectoris: I25.10

## 2019-06-21 HISTORY — DX: Unspecified systolic (congestive) heart failure: I50.20

## 2019-06-21 HISTORY — DX: Varicose veins of unspecified lower extremity with ulcer of unspecified site: I83.009

## 2019-06-21 HISTORY — DX: Chronic kidney disease, stage 3 unspecified: N18.30

## 2019-06-21 HISTORY — DX: Non-pressure chronic ulcer of unspecified part of unspecified lower leg with unspecified severity: L97.909

## 2019-06-21 HISTORY — DX: Chronic obstructive pulmonary disease, unspecified: J44.9

## 2019-06-21 HISTORY — DX: Left bundle-branch block, unspecified: I44.7

## 2019-06-21 HISTORY — DX: COVID-19: U07.1

## 2019-06-21 LAB — CBC WITH DIFFERENTIAL/PLATELET
Abs Immature Granulocytes: 0.06 10*3/uL (ref 0.00–0.07)
Basophils Absolute: 0.1 10*3/uL (ref 0.0–0.1)
Basophils Relative: 1 %
Eosinophils Absolute: 0.1 10*3/uL (ref 0.0–0.5)
Eosinophils Relative: 1 %
HCT: 28.3 % — ABNORMAL LOW (ref 39.0–52.0)
Hemoglobin: 9.1 g/dL — ABNORMAL LOW (ref 13.0–17.0)
Immature Granulocytes: 1 %
Lymphocytes Relative: 8 %
Lymphs Abs: 1 10*3/uL (ref 0.7–4.0)
MCH: 29.1 pg (ref 26.0–34.0)
MCHC: 32.2 g/dL (ref 30.0–36.0)
MCV: 90.4 fL (ref 80.0–100.0)
Monocytes Absolute: 1.1 10*3/uL — ABNORMAL HIGH (ref 0.1–1.0)
Monocytes Relative: 9 %
Neutro Abs: 10.2 10*3/uL — ABNORMAL HIGH (ref 1.7–7.7)
Neutrophils Relative %: 80 %
Platelets: 349 10*3/uL (ref 150–400)
RBC: 3.13 MIL/uL — ABNORMAL LOW (ref 4.22–5.81)
RDW: 14 % (ref 11.5–15.5)
WBC: 12.5 10*3/uL — ABNORMAL HIGH (ref 4.0–10.5)
nRBC: 0 % (ref 0.0–0.2)

## 2019-06-21 LAB — HEPARIN LEVEL (UNFRACTIONATED): Heparin Unfractionated: 0.12 IU/mL — ABNORMAL LOW (ref 0.30–0.70)

## 2019-06-21 LAB — RESPIRATORY PANEL BY PCR

## 2019-06-21 LAB — BLOOD GAS, VENOUS
Acid-base deficit: 1.7 mmol/L (ref 0.0–2.0)
Bicarbonate: 24.3 mmol/L (ref 20.0–28.0)
O2 Saturation: 88.9 %
Patient temperature: 37
pCO2, Ven: 45 mmHg (ref 44.0–60.0)
pH, Ven: 7.34 (ref 7.250–7.430)
pO2, Ven: 60 mmHg — ABNORMAL HIGH (ref 32.0–45.0)

## 2019-06-21 LAB — BASIC METABOLIC PANEL
Anion gap: 14 (ref 5–15)
BUN: 55 mg/dL — ABNORMAL HIGH (ref 8–23)
CO2: 21 mmol/L — ABNORMAL LOW (ref 22–32)
Calcium: 8.7 mg/dL — ABNORMAL LOW (ref 8.9–10.3)
Chloride: 104 mmol/L (ref 98–111)
Creatinine, Ser: 3.88 mg/dL — ABNORMAL HIGH (ref 0.61–1.24)
GFR calc Af Amer: 18 mL/min — ABNORMAL LOW (ref >60)
GFR calc non Af Amer: 15 mL/min — ABNORMAL LOW (ref >60)
Glucose, Bld: 233 mg/dL — ABNORMAL HIGH (ref 70–99)
Potassium: 4.8 mmol/L (ref 3.5–5.1)
Sodium: 139 mmol/L (ref 135–145)

## 2019-06-21 LAB — TROPONIN I (HIGH SENSITIVITY)
Troponin I (High Sensitivity): 1369 ng/L (ref <18)
Troponin I (High Sensitivity): 2780 ng/L (ref <18)
Troponin I (High Sensitivity): 5300 ng/L (ref <18)
Troponin I (High Sensitivity): 8795 ng/L (ref <18)

## 2019-06-21 LAB — GLUCOSE, CAPILLARY
Glucose-Capillary: 202 mg/dL — ABNORMAL HIGH (ref 70–99)
Glucose-Capillary: 384 mg/dL — ABNORMAL HIGH (ref 70–99)
Glucose-Capillary: 418 mg/dL — ABNORMAL HIGH (ref 70–99)
Glucose-Capillary: 477 mg/dL — ABNORMAL HIGH (ref 70–99)

## 2019-06-21 LAB — ECHOCARDIOGRAM COMPLETE
Height: 68 in
Weight: 3120 oz

## 2019-06-21 LAB — HEMOGLOBIN A1C
Hgb A1c MFr Bld: 6.8 % — ABNORMAL HIGH (ref 4.8–5.6)
Mean Plasma Glucose: 148.46 mg/dL

## 2019-06-21 LAB — PROTIME-INR
INR: 1 (ref 0.8–1.2)
Prothrombin Time: 13.1 seconds (ref 11.4–15.2)

## 2019-06-21 LAB — APTT: aPTT: 52 seconds — ABNORMAL HIGH (ref 24–36)

## 2019-06-21 LAB — LACTIC ACID, PLASMA
Lactic Acid, Venous: 1.4 mmol/L (ref 0.5–1.9)
Lactic Acid, Venous: 0.8 mmol/L (ref 0.5–1.9)

## 2019-06-21 LAB — PROCALCITONIN: Procalcitonin: 0.3 ng/mL

## 2019-06-21 LAB — BRAIN NATRIURETIC PEPTIDE: B Natriuretic Peptide: 1167 pg/mL — ABNORMAL HIGH (ref 0.0–100.0)

## 2019-06-21 LAB — HIV ANTIBODY (ROUTINE TESTING W REFLEX): HIV Screen 4th Generation wRfx: NONREACTIVE

## 2019-06-21 LAB — POC SARS CORONAVIRUS 2 AG: SARS Coronavirus 2 Ag: NEGATIVE

## 2019-06-21 LAB — ABO/RH: ABO/RH(D): O NEG

## 2019-06-21 MED ORDER — CARVEDILOL 25 MG PO TABS
25.0000 mg | ORAL_TABLET | Freq: Two times a day (BID) | ORAL | Status: DC
  Administered 2019-06-21 – 2019-06-28 (×15): 25 mg via ORAL
  Filled 2019-06-21 (×15): qty 1

## 2019-06-21 MED ORDER — HEPARIN BOLUS VIA INFUSION
4000.0000 [IU] | Freq: Once | INTRAVENOUS | Status: AC
  Administered 2019-06-21: 4000 [IU] via INTRAVENOUS
  Filled 2019-06-21: qty 4000

## 2019-06-21 MED ORDER — INSULIN ASPART 100 UNIT/ML ~~LOC~~ SOLN
0.0000 [IU] | Freq: Every day | SUBCUTANEOUS | Status: DC
  Administered 2019-06-21: 5 [IU] via SUBCUTANEOUS
  Administered 2019-06-22 – 2019-06-23 (×2): 2 [IU] via SUBCUTANEOUS
  Administered 2019-06-24: 3 [IU] via SUBCUTANEOUS
  Administered 2019-06-26: 2 [IU] via SUBCUTANEOUS
  Filled 2019-06-21 (×6): qty 1

## 2019-06-21 MED ORDER — INSULIN NPH (HUMAN) (ISOPHANE) 100 UNIT/ML ~~LOC~~ SUSP: 20.0000 [IU] | Freq: Every day | SUBCUTANEOUS | Status: DC

## 2019-06-21 MED ORDER — DEXAMETHASONE SODIUM PHOSPHATE 10 MG/ML IJ SOLN: 10.0000 mg | Freq: Once | INTRAMUSCULAR | Status: DC

## 2019-06-21 MED ORDER — FUROSEMIDE 10 MG/ML IJ SOLN: 40.0000 mg | Freq: Two times a day (BID) | INTRAMUSCULAR | Status: DC

## 2019-06-21 MED ORDER — IPRATROPIUM-ALBUTEROL 0.5-2.5 (3) MG/3ML IN SOLN
3.0000 mL | Freq: Four times a day (QID) | RESPIRATORY_TRACT | Status: DC | PRN
  Administered 2019-06-21 – 2019-06-23 (×4): 3 mL via RESPIRATORY_TRACT
  Filled 2019-06-21 (×4): qty 3

## 2019-06-21 MED ORDER — ALUM & MAG HYDROXIDE-SIMETH 200-200-20 MG/5ML PO SUSP
15.0000 mL | Freq: Four times a day (QID) | ORAL | Status: DC | PRN
  Filled 2019-06-21: qty 30

## 2019-06-21 MED ORDER — IPRATROPIUM-ALBUTEROL 0.5-2.5 (3) MG/3ML IN SOLN
3.0000 mL | Freq: Once | RESPIRATORY_TRACT | Status: AC
  Administered 2019-06-21: 3 mL via RESPIRATORY_TRACT

## 2019-06-21 MED ORDER — INSULIN DETEMIR 100 UNIT/ML ~~LOC~~ SOLN
20.0000 [IU] | Freq: Every day | SUBCUTANEOUS | Status: DC
  Administered 2019-06-21: 20 [IU] via SUBCUTANEOUS
  Filled 2019-06-21 (×2): qty 0.2

## 2019-06-21 MED ORDER — TRAMADOL HCL 50 MG PO TABS
50.0000 mg | ORAL_TABLET | Freq: Four times a day (QID) | ORAL | Status: DC | PRN
  Administered 2019-06-21 – 2019-06-26 (×8): 50 mg via ORAL
  Filled 2019-06-21 (×9): qty 1

## 2019-06-21 MED ORDER — SODIUM CHLORIDE 0.9% FLUSH
3.0000 mL | Freq: Two times a day (BID) | INTRAVENOUS | Status: DC
  Administered 2019-06-21: 3 mL via INTRAVENOUS

## 2019-06-21 MED ORDER — SODIUM CHLORIDE 0.9 % IV SOLN
2.0000 g | INTRAVENOUS | Status: AC
  Administered 2019-06-21 – 2019-06-25 (×5): 2 g via INTRAVENOUS
  Filled 2019-06-21 (×2): qty 2
  Filled 2019-06-21: qty 20
  Filled 2019-06-21 (×3): qty 2

## 2019-06-21 MED ORDER — DEXAMETHASONE SODIUM PHOSPHATE 10 MG/ML IJ SOLN
6.0000 mg | INTRAMUSCULAR | Status: DC
  Administered 2019-06-21: 6 mg via INTRAVENOUS
  Filled 2019-06-21: qty 1

## 2019-06-21 MED ORDER — HEPARIN BOLUS VIA INFUSION
2500.0000 [IU] | Freq: Once | INTRAVENOUS | Status: AC
  Administered 2019-06-21: 2500 [IU] via INTRAVENOUS
  Filled 2019-06-21: qty 2500

## 2019-06-21 MED ORDER — HEPARIN (PORCINE) 25000 UT/250ML-% IV SOLN
1800.0000 [IU]/h | INTRAVENOUS | Status: DC
  Administered 2019-06-21: 1000 [IU]/h via INTRAVENOUS
  Administered 2019-06-22: 1450 [IU]/h via INTRAVENOUS
  Administered 2019-06-22: 1300 [IU]/h via INTRAVENOUS
  Administered 2019-06-23: 1550 [IU]/h via INTRAVENOUS
  Filled 2019-06-21 (×8): qty 250

## 2019-06-21 MED ORDER — DEXAMETHASONE SODIUM PHOSPHATE 10 MG/ML IJ SOLN
10.0000 mg | Freq: Once | INTRAMUSCULAR | Status: AC
  Administered 2019-06-21: 10 mg via INTRAVENOUS
  Filled 2019-06-21: qty 1

## 2019-06-21 MED ORDER — SODIUM CHLORIDE 0.9 % IV SOLN
1.0000 g | INTRAVENOUS | Status: AC
  Administered 2019-06-21: 1 g via INTRAVENOUS
  Filled 2019-06-21: qty 10

## 2019-06-21 MED ORDER — SODIUM CHLORIDE 0.9 % IV SOLN
500.0000 mg | INTRAVENOUS | Status: DC
  Administered 2019-06-21: 500 mg via INTRAVENOUS
  Filled 2019-06-21 (×2): qty 500

## 2019-06-21 MED ORDER — DULOXETINE HCL 30 MG PO CPEP
30.0000 mg | ORAL_CAPSULE | Freq: Every day | ORAL | Status: DC
  Administered 2019-06-21 – 2019-06-28 (×8): 30 mg via ORAL
  Filled 2019-06-21 (×8): qty 1

## 2019-06-21 MED ORDER — ENOXAPARIN SODIUM 40 MG/0.4ML ~~LOC~~ SOLN: 30.0000 mg | SUBCUTANEOUS | Status: DC

## 2019-06-21 MED ORDER — PENTOXIFYLLINE ER 400 MG PO TBCR
400.0000 mg | EXTENDED_RELEASE_TABLET | Freq: Three times a day (TID) | ORAL | Status: DC
  Administered 2019-06-21 – 2019-06-28 (×19): 400 mg via ORAL
  Filled 2019-06-21 (×27): qty 1

## 2019-06-21 MED ORDER — ASPIRIN EC 81 MG PO TBEC
81.0000 mg | DELAYED_RELEASE_TABLET | Freq: Every day | ORAL | Status: DC
  Administered 2019-06-22 – 2019-06-28 (×7): 81 mg via ORAL
  Filled 2019-06-21 (×10): qty 1

## 2019-06-21 MED ORDER — INSULIN ASPART 100 UNIT/ML ~~LOC~~ SOLN
0.0000 [IU] | Freq: Three times a day (TID) | SUBCUTANEOUS | Status: DC
  Administered 2019-06-22: 20 [IU] via SUBCUTANEOUS
  Administered 2019-06-22: 15 [IU] via SUBCUTANEOUS
  Administered 2019-06-23: 7 [IU] via SUBCUTANEOUS
  Administered 2019-06-23: 4 [IU] via SUBCUTANEOUS
  Administered 2019-06-24: 3 [IU] via SUBCUTANEOUS
  Administered 2019-06-24: 11 [IU] via SUBCUTANEOUS
  Administered 2019-06-24 – 2019-06-25 (×3): 7 [IU] via SUBCUTANEOUS
  Administered 2019-06-26: 3 [IU] via SUBCUTANEOUS
  Administered 2019-06-26: 13:00:00 7 [IU] via SUBCUTANEOUS
  Administered 2019-06-26: 11 [IU] via SUBCUTANEOUS
  Administered 2019-06-27: 7 [IU] via SUBCUTANEOUS
  Administered 2019-06-27: 15 [IU] via SUBCUTANEOUS
  Administered 2019-06-27: 4 [IU] via SUBCUTANEOUS
  Administered 2019-06-28: 7 [IU] via SUBCUTANEOUS
  Administered 2019-06-28: 15 [IU] via SUBCUTANEOUS
  Filled 2019-06-21 (×18): qty 1

## 2019-06-21 MED ORDER — AMLODIPINE BESYLATE 10 MG PO TABS
10.0000 mg | ORAL_TABLET | Freq: Every day | ORAL | Status: DC
  Administered 2019-06-21 – 2019-06-28 (×8): 10 mg via ORAL
  Filled 2019-06-21 (×7): qty 1
  Filled 2019-06-21: qty 2

## 2019-06-21 MED ORDER — INSULIN DETEMIR 100 UNIT/ML ~~LOC~~ SOLN
20.0000 [IU] | Freq: Every day | SUBCUTANEOUS | Status: DC
  Filled 2019-06-21: qty 0.2

## 2019-06-21 MED ORDER — IPRATROPIUM-ALBUTEROL 0.5-2.5 (3) MG/3ML IN SOLN
3.0000 mL | Freq: Once | RESPIRATORY_TRACT | Status: DC
Start: 2019-06-21 — End: 2019-06-21
  Filled 2019-06-21: qty 3

## 2019-06-21 MED ORDER — IPRATROPIUM-ALBUTEROL 0.5-2.5 (3) MG/3ML IN SOLN
3.0000 mL | Freq: Once | RESPIRATORY_TRACT | Status: DC
  Filled 2019-06-21: qty 3

## 2019-06-21 MED ORDER — INSULIN ASPART 100 UNIT/ML ~~LOC~~ SOLN
0.0000 [IU] | Freq: Three times a day (TID) | SUBCUTANEOUS | Status: DC
  Administered 2019-06-21: 15 [IU] via SUBCUTANEOUS
  Administered 2019-06-21: 5 [IU] via SUBCUTANEOUS
  Administered 2019-06-21: 15 [IU] via SUBCUTANEOUS
  Filled 2019-06-21 (×3): qty 1

## 2019-06-21 MED ORDER — LEVOTHYROXINE SODIUM 100 MCG PO TABS
100.0000 ug | ORAL_TABLET | Freq: Every day | ORAL | Status: DC
  Administered 2019-06-22 – 2019-06-28 (×7): 100 ug via ORAL
  Filled 2019-06-21 (×7): qty 1

## 2019-06-21 MED ORDER — SODIUM CHLORIDE 0.9 % IV SOLN
250.0000 mL | INTRAVENOUS | Status: DC | PRN
  Administered 2019-06-21: 250 mL via INTRAVENOUS

## 2019-06-21 MED ORDER — ATORVASTATIN CALCIUM 20 MG PO TABS
80.0000 mg | ORAL_TABLET | Freq: Every day | ORAL | Status: DC
  Administered 2019-06-21 – 2019-06-28 (×8): 80 mg via ORAL
  Filled 2019-06-21 (×8): qty 4

## 2019-06-21 MED ORDER — FUROSEMIDE 40 MG PO TABS: 80.0000 mg | ORAL_TABLET | Freq: Every day | ORAL | Status: DC

## 2019-06-21 MED ORDER — SODIUM CHLORIDE 0.9% FLUSH: 3.0000 mL | INTRAVENOUS | Status: DC | PRN

## 2019-06-21 MED ORDER — ALBUTEROL SULFATE HFA 108 (90 BASE) MCG/ACT IN AERS
2.0000 | INHALATION_SPRAY | Freq: Four times a day (QID) | RESPIRATORY_TRACT | Status: DC
  Administered 2019-06-21 – 2019-06-28 (×30): 2 via RESPIRATORY_TRACT
  Filled 2019-06-21: qty 6.7

## 2019-06-21 MED ORDER — FUROSEMIDE 10 MG/ML IJ SOLN
80.0000 mg | Freq: Two times a day (BID) | INTRAMUSCULAR | Status: DC
  Administered 2019-06-21 – 2019-06-23 (×4): 80 mg via INTRAVENOUS
  Filled 2019-06-21 (×4): qty 8

## 2019-06-21 MED ORDER — SODIUM CHLORIDE 0.9 % IV SOLN
500.0000 mg | INTRAVENOUS | Status: AC
  Administered 2019-06-21: 500 mg via INTRAVENOUS
  Filled 2019-06-21: qty 500

## 2019-06-21 NOTE — Progress Notes (Signed)
ANTICOAGULATION CONSULT NOTE - Initial Consult  Pharmacy Consult for Heparin Indication: chest pain/ACS  Allergies  Allergen Reactions  . Other Other (See Comments)    Hypoglycemia unawareness Hypoglycemia unawareness    Patient Measurements: Height: 5\' 8"  (172.7 cm) Weight: 195 lb (88.5 kg) IBW/kg (Calculated) : 68.4 HEPARIN DW (KG): 86.4  Vital Signs: Temp: 99 F (37.2 C) (12/24 0149) Temp Source: Oral (12/24 0149) BP: 145/66 (12/24 0430) Pulse Rate: 75 (12/24 0430)  Labs: Recent Labs    06/21/19 0154 06/21/19 0155 06/21/19 0156 06/21/19 0412  HGB 9.1*  --   --   --   HCT 28.3*  --   --   --   PLT 349  --   --   --   LABPROT  --  13.1  --   --   INR  --  1.0  --   --   CREATININE 3.88*  --   --   --   TROPONINIHS  --   --  1,369* 2,780*    Estimated Creatinine Clearance: 20.2 mL/min (A) (by C-G formula based on SCr of 3.88 mg/dL (H)).   Medical History: Past Medical History:  Diagnosis Date  . COPD (chronic obstructive pulmonary disease) ([place])   . Diabetes mellitus without complication ([place])     Medications:  (Not in a hospital admission)   Assessment: Pharmacy asked to initiate and monitor Heparin for ACS.  No anticoagulants PTA per current med list.  Baseline labs ordered.   Goal of Therapy:  Heparin level 0.3-0.7 units/ml Monitor platelets by anticoagulation protocol: Yes   Plan:  Heparin 4000 unit bolus now x 1 Heparin infusion at 1000 units/hr Check HL in 8 hours  [place] Crane, Dilon Lank A 06/21/2019,6:00 AM

## 2019-06-21 NOTE — Progress Notes (Signed)
*  PRELIMINARY RESULTS* Echocardiogram 2D Echocardiogram has been performed.  Jonette Mate Aubry Rankin 06/21/2019, 1:37 PM

## 2019-06-21 NOTE — Progress Notes (Signed)
CRITICAL VALUE ALERT  Critical Value:  CBG 418  Date & Time Notied:  06/21/19 1818  Provider Notified: Kurtis Bushman  Orders Received/Actions taken: Administered 15 units per sliding scale order, awaiting further orders. Paged and text paged Dr. Kurtis Bushman.

## 2019-06-21 NOTE — ED Notes (Signed)
Lab called about lactic and PT at this time.

## 2019-06-21 NOTE — ED Triage Notes (Signed)
Pt to ED via EMS from home. Per ems pt called out for SOB. Pt was dx with COVID last month, states breathing hasn't been normal since. Pt has hx of COPD, does not wear o2 at home. Pt arrives 68% room air. Pt placed on 4L at this time. Given albuterol with ems and 4mg  zofran

## 2019-06-21 NOTE — ED Notes (Signed)
Ultrasound in with pt 

## 2019-06-21 NOTE — ED Notes (Signed)
ED TO INPATIENT HANDOFF REPORT  ED Nurse Name and Phone #: Morgen Ritacco [phone]  S Name/Age/Gender Bryce Garcia 68 y.o. male Room/Bed: ED16A/ED16A  Code Status   Code Status: DNR  Home/SNF/Other Home Patient oriented to: self, place, time and situation Is this baseline? Yes   Triage Complete: Triage complete  Chief Complaint Acute respiratory failure ([place]) [J96.00]  Triage Note Pt to ED via EMS from home. Per ems pt called out for SOB. Pt was dx with COVID last month, states breathing hasn't been normal since. Pt has hx of COPD, does not wear o2 at home. Pt arrives 68% room air. Pt placed on 4L at this time. Given albuterol with ems and 4mg  zofran    Allergies Allergies  Allergen Reactions  . Other Other (See Comments)    Hypoglycemia unawareness Hypoglycemia unawareness     Level of Care/Admitting Diagnosis ED Disposition    ED Disposition Condition Comment   Admit  Hospital Area: [facility] [[id number]]  Level of Care: Med-Surg [16]  Covid Evaluation: Symptomatic Person Under Investigation (PUI)  Diagnosis: Acute respiratory failure ([place]) [518.81.ICD-9-CM]  Admitting Physician: Athena Masse [id number]  Attending Physician: Athena Masse [id number]  Estimated length of stay: 3 - 4 days  Certification:: I certify this patient will need inpatient services for at least 2 midnights       B Medical/Surgery History Past Medical History:  Diagnosis Date  . CAD (coronary artery disease)   . CKD (chronic kidney disease), stage III   . COPD (chronic obstructive pulmonary disease) ([place])   . COVID-19   . Diabetes mellitus without complication ([place])   . HFrEF (heart failure with reduced ejection fraction) ([place])   . LBBB (left bundle branch block)   . Venous ulcer ([place])    History reviewed. No pertinent surgical history.   A IV Location/Drains/Wounds Patient Lines/Drains/Airways Status   Active Line/Drains/Airways    Name:   Placement date:    Placement time:   Site:   Days:   Peripheral IV 06/21/19 Left Hand   06/21/19    0202    Hand   less than 1   Peripheral IV 06/21/19 Right Forearm   06/21/19    0233    Forearm   less than 1          Intake/Output Last 24 hours  Intake/Output Summary (Last 24 hours) at 06/21/2019 1322 Last data filed at 06/21/2019 0351 Gross per 24 hour  Intake 390 ml  Output --  Net 390 ml    Labs/Imaging Results for orders placed or performed during the hospital encounter of 06/21/19 (from the past 48 hour(s))  CBC with Differential     Status: Abnormal   Collection Time: 06/21/19  1:54 AM  Result Value Ref Range   WBC 12.5 (H) 4.0 - 10.5 K/uL   RBC 3.13 (L) 4.22 - 5.81 MIL/uL   Hemoglobin 9.1 (L) 13.0 - 17.0 g/dL   HCT 28.3 (L) 39.0 - 52.0 %   MCV 90.4 80.0 - 100.0 fL   MCH 29.1 26.0 - 34.0 pg   MCHC 32.2 30.0 - 36.0 g/dL   RDW 14.0 11.5 - 15.5 %   Platelets 349 150 - 400 K/uL   nRBC 0.0 0.0 - 0.2 %   Neutrophils Relative % 80 %   Neutro Abs 10.2 (H) 1.7 - 7.7 K/uL   Lymphocytes Relative 8 %   Lymphs Abs 1.0 0.7 - 4.0 K/uL   Monocytes Relative 9 %  Monocytes Absolute 1.1 (H) 0.1 - 1.0 K/uL   Eosinophils Relative 1 %   Eosinophils Absolute 0.1 0.0 - 0.5 K/uL   Basophils Relative 1 %   Basophils Absolute 0.1 0.0 - 0.1 K/uL   Immature Granulocytes 1 %   Abs Immature Granulocytes 0.06 0.00 - 0.07 K/uL    Comment: Performed at [facility], [address] [place], [place] [id number]  Basic metabolic panel     Status: Abnormal   Collection Time: 06/21/19  1:54 AM  Result Value Ref Range   Sodium 139 135 - 145 mmol/L   Potassium 4.8 3.5 - 5.1 mmol/L   Chloride 104 98 - 111 mmol/L   CO2 21 (L) 22 - 32 mmol/L   Glucose, Bld 233 (H) 70 - 99 mg/dL   BUN 55 (H) 8 - 23 mg/dL   Creatinine, Ser 3.88 (H) 0.61 - 1.24 mg/dL   Calcium 8.7 (L) 8.9 - 10.3 mg/dL   GFR calc non Af Amer 15 (L) >60 mL/min   GFR calc Af Amer 18 (L) >60 mL/min   Anion gap 14 5 - 15    Comment: Performed  at [facility], [address] [place], [place] [address]  Brain natriuretic peptide     Status: Abnormal   Collection Time: 06/21/19  1:55 AM  Result Value Ref Range   B Natriuretic Peptide 1,167.0 (H) 0.0 - 100.0 pg/mL    Comment: Performed at [facility], [address] [place], [place] [address]  Lactic acid, plasma     Status: None   Collection Time: 06/21/19  1:55 AM  Result Value Ref Range   Lactic Acid, Venous 1.4 0.5 - 1.9 mmol/L    Comment: Performed at [facility], [address] [place], [place] [address]  Protime-INR     Status: None   Collection Time: 06/21/19  1:55 AM  Result Value Ref Range   Prothrombin Time 13.1 11.4 - 15.2 seconds   INR 1.0 0.8 - 1.2    Comment: (NOTE) INR goal varies based on device and disease states. Performed at [facility], [address] [place], [place] [address]   Troponin I (High Sensitivity)     Status: Abnormal   Collection Time: 06/21/19  1:56 AM  Result Value Ref Range   Troponin I (High Sensitivity) 1,369 (HH) <18 ng/L    Comment: CRITICAL RESULT CALLED TO, READ BACK BY AND VERIFIED WITH: Cyndi Bender AT 0310 ON 06/21/19 RWW Performed at [facility], [address], [place], [place] [address]   Blood Culture (routine x 2)     Status: None (Preliminary result)   Collection Time: 06/21/19  2:08 AM   Specimen: BLOOD  Result Value Ref Range   Specimen Description BLOOD BLOOD RIGHT FOREARM    Special Requests      BOTTLES DRAWN AEROBIC AND ANAEROBIC Blood Culture adequate volume   Culture      NO GROWTH < 12 HOURS Performed at [facility], [address] [place],  [address]    Report Status PENDING   Blood gas, venous     Status: Abnormal   Collection Time: 06/21/19  2:09 AM  Result Value Ref Range   pH, Ven 7.34 7.250 - 7.430   pCO2, Ven 45 44.0 - 60.0 mmHg   pO2, Ven 60.0 (H) 32.0 - 45.0 mmHg   Bicarbonate 24.3 20.0 - 28.0 mmol/L   Acid-base  deficit 1.7 0.0 - 2.0 mmol/L  O2 Saturation 88.9 %   Patient temperature 37.0    Collection site VEIN    Sample type VENOUS     Comment: Performed at [facility], [address] [place], [place] [address]  Blood Culture (routine x 2)     Status: None (Preliminary result)   Collection Time: 06/21/19  2:13 AM   Specimen: BLOOD  Result Value Ref Range   Specimen Description BLOOD LEFT ANTECUBITAL    Special Requests      BOTTLES DRAWN AEROBIC AND ANAEROBIC Blood Culture results may not be optimal due to an inadequate volume of blood received in culture bottles   Culture      NO GROWTH < 12 HOURS Performed at [facility], [address] [place], [place] [address]    Report Status PENDING   POC SARS Coronavirus 2 Ag     Status: None   Collection Time: 06/21/19  3:57 AM  Result Value Ref Range   SARS Coronavirus 2 Ag NEGATIVE NEGATIVE    Comment: (NOTE) SARS-CoV-2 antigen NOT DETECTED.  Negative results are presumptive.  Negative results do not preclude SARS-CoV-2 infection and should not be used as the sole basis for treatment or other patient management decisions, including infection  control decisions, particularly in the presence of clinical signs and  symptoms consistent with COVID-19, or in those who have been in contact with the virus.  Negative results must be combined with clinical observations, patient history, and epidemiological information. The expected result is Negative. Fact Sheet for Patients: PodPark.tn Fact Sheet for Healthcare Providers: GiftContent.is This test is not yet approved or cleared by the [place] FDA and  has been authorized for detection and/or diagnosis of SARS-CoV-2 by FDA under an Emergency Use Authorization (EUA).  This EUA will remain in effect (meaning this test can be used) for the duration of  the COVID-19 de claration under Section 564(b)(1) of the Act,  21 U.S.C. section 360bbb-3(b)(1), unless the authorization is terminated or revoked sooner.   Lactic acid, plasma     Status: None   Collection Time: 06/21/19  4:12 AM  Result Value Ref Range   Lactic Acid, Venous 0.8 0.5 - 1.9 mmol/L    Comment: Performed at [facility], [address] [place], [place] [address]  HIV Antibody (routine testing w rflx)     Status: None   Collection Time: 06/21/19  4:12 AM  Result Value Ref Range   HIV Screen 4th Generation wRfx NON REACTIVE NON REACTIVE    Comment: Performed at [facility] [address], [place], [place] [address]  Hemoglobin A1c     Status: Abnormal   Collection Time: 06/21/19  4:12 AM  Result Value Ref Range   Hgb A1c MFr Bld 6.8 (H) 4.8 - 5.6 %    Comment: (NOTE) Pre diabetes:          5.7%-6.4% Diabetes:              >6.4% Glycemic control for   <7.0% adults with diabetes    Mean Plasma Glucose 148.46 mg/dL    Comment: Performed at [facility] [address] [place],  [address]  Troponin I (High Sensitivity)     Status: Abnormal   Collection Time: 06/21/19  4:12 AM  Result Value Ref Range   Troponin I (High Sensitivity) 2,780 (HH) <18 ng/L    Comment: CRITICAL VALUE NOTED. VALUE IS CONSISTENT WITH PREVIOUSLY REPORTED/CALLED VALUE RWW (NOTE) Elevated high  sensitivity troponin I (hsTnI) values and significant  changes across serial measurements may suggest ACS but many other  chronic and acute conditions are known to elevate hsTnI results.  Refer to the "Links" section for chest pain algorithms and additional  guidance. Performed at [facility], [address] [place], [place] [address]   Troponin I (High Sensitivity)     Status: Abnormal   Collection Time: 06/21/19  4:33 AM  Result Value Ref Range   Troponin I (High Sensitivity) 5,300 (HH) <18 ng/L    Comment: READ BACK AND VERIFIED WITH ARIEL SMITH ON 06/21/2019 AT 1116 QSD (NOTE) Elevated high sensitivity troponin I  (hsTnI) values and significant  changes across serial measurements may suggest ACS but many other  chronic and acute conditions are known to elevate hsTnI results.  Refer to the "Links" section for chest pain algorithms and additional  guidance. Performed at [facility], [address] [place], [place] [address]   Procalcitonin - Baseline     Status: None   Collection Time: 06/21/19  4:33 AM  Result Value Ref Range   Procalcitonin 0.30 ng/mL    Comment:        Interpretation: PCT (Procalcitonin) <= 0.5 ng/mL: Systemic infection (sepsis) is not likely. Local bacterial infection is possible. (NOTE)       Sepsis PCT Algorithm           Lower Respiratory Tract                                      Infection PCT Algorithm    ----------------------------     ----------------------------         PCT < 0.25 ng/mL                PCT < 0.10 ng/mL         Strongly encourage             Strongly discourage   discontinuation of antibiotics    initiation of antibiotics    ----------------------------     -----------------------------       PCT 0.25 - 0.50 ng/mL            PCT 0.10 - 0.25 ng/mL               OR       >80% decrease in PCT            Discourage initiation of                                            antibiotics      Encourage discontinuation           of antibiotics    ----------------------------     -----------------------------         PCT >= 0.50 ng/mL              PCT 0.26 - 0.50 ng/mL               AND        <80% decrease in PCT             Encourage initiation of  antibiotics       Encourage continuation           of antibiotics    ----------------------------     -----------------------------        PCT >= 0.50 ng/mL                  PCT > 0.50 ng/mL               AND         increase in PCT                  Strongly encourage                                      initiation of antibiotics    Strongly encourage  escalation           of antibiotics                                     -----------------------------                                           PCT <= 0.25 ng/mL                                                 OR                                        > 80% decrease in PCT                                     Discontinue / Do not initiate                                             antibiotics Performed at [facility], [address] [place], [place] [address]   ABO/Rh     Status: None   Collection Time: 06/21/19  5:28 AM  Result Value Ref Range   ABO/RH(D)      Jenetta Downer NEG Performed at [facility], [place],  [address]   Glucose, capillary     Status: Abnormal   Collection Time: 06/21/19  9:07 AM  Result Value Ref Range   Glucose-Capillary 202 (H) 70 - 99 mg/dL   Comment 1 Notify RN    Comment 2 Document in Chart    US RENAL  Result Date: 06/21/2019 CLINICAL DATA:  Acute kidney injury. Stage 4 chronic kidney disease. EXAM: RENAL / URINARY TRACT ULTRASOUND COMPLETE COMPARISON:  Chest CTA dated 10/27/2013. FINDINGS: Right Kidney: Renal measurements: 9.5 x 4.6 x 4.6 cm = volume: 106 mL. Mildly echogenic. No hydronephrosis. Diffuse cortical thinning with prominent renal sinus fat. Left Kidney: Renal measurements: 8.7 x 4.6 x 4.5 cm =  volume: 94 mL. Borderline echogenic. No hydronephrosis. Bladder: Appears normal for degree of bladder distention. Other: None. IMPRESSION: 1. Mildly echogenic right kidney and borderline echogenic left kidney, compatible with medical renal disease. 2. No hydronephrosis. 3. Bilateral renal atrophy, greater on the right. Electronically Signed   By: Claudie Revering M.D.   On: 06/21/2019 11:56   DG Chest Portable 1 View  Result Date: 06/21/2019 CLINICAL DATA:  COVID-19 diagnosed 1 month prior, abnormal breathing since EXAM: PORTABLE CHEST 1 VIEW COMPARISON:  Chest radiograph 01/07/2014 FINDINGS: Diffuse interstitial and  multifocal areas of airspace opacity with more dense consolidative opacity in the left mid lung. No pneumothorax. Small bilateral effusions. Prominence of the cardiac silhouette likely related to low volumes and portable technique. Postsurgical changes related to prior CABG including intact and aligned sternotomy wires and multiple surgical clips projecting over the mediastinum. No acute osseous or soft tissue abnormality. Telemetry leads overlie the chest. IMPRESSION: 1. Diffuse interstitial and multifocal areas of airspace opacity with more dense consolidative opacity in the left mid lung, compatible with multifocal pneumonia. 2. Small bilateral effusions. Electronically Signed   By: Lovena Le M.D.   On: 06/21/2019 02:47    Pending Labs Unresulted Labs (From admission, onward)    Start     Ordered   06/22/19 [id number]  Basic metabolic panel  Tomorrow morning,   STAT     06/21/19 0341   06/22/19 0500  CBC  Tomorrow morning,   STAT     06/21/19 0341   06/22/19 0500  Lipid panel  Tomorrow morning,   STAT     06/21/19 1152   06/21/19 1500  Heparin level (unfractionated)  Once-Timed,   STAT     06/21/19 1319   06/21/19 1055  Protein / creatinine ratio, urine  Once,   STAT     06/21/19 1054   06/21/19 0603  apttadd  Add-on,   AD     06/21/19 0602   06/21/19 0337  Culture, sputum-assessment  Once,   STAT     06/21/19 0341   06/21/19 0337  Respiratory Panel by PCR  (Respiratory virus panel with precautions)  Once,   STAT     06/21/19 0341   06/21/19 0208  Urinalysis, Routine w reflex microscopic  ONCE - STAT,   STAT     06/21/19 0207   06/21/19 0208  Urine culture  ONCE - STAT,   STAT     06/21/19 0207   Unscheduled  Occult blood card to lab, stool  As needed,   STAT     06/21/19 0435          Vitals/Pain Today's Vitals   06/21/19 1030 06/21/19 1100 06/21/19 1230 06/21/19 1300  BP: (!) 139/94 136/68 118/60 139/65  Pulse: 78 73 70 72  Resp: 18 16 17 19   Temp:      TempSrc:      SpO2:  92% 93% 92% 95%  Weight:      Height:      PainSc:        Isolation Precautions Droplet precaution  Medications Medications  sodium chloride flush (NS) 0.9 % injection 3 mL (3 mLs Intravenous Given 06/21/19 1020)  sodium chloride flush (NS) 0.9 % injection 3 mL (has no administration in time range)  0.9 %  sodium chloride infusion (has no administration in time range)  cefTRIAXone (ROCEPHIN) 2 g in sodium chloride 0.9 % 100 mL IVPB (has no administration in time range)  azithromycin (ZITHROMAX) 500  mg in sodium chloride 0.9 % 250 mL IVPB (has no administration in time range)  albuterol (VENTOLIN HFA) 108 (90 Base) MCG/ACT inhaler 2 puff (2 puffs Inhalation Given 06/21/19 1019)  dexamethasone (DECADRON) injection 6 mg (6 mg Intravenous Given 06/21/19 0415)  insulin aspart (novoLOG) injection 0-15 Units (5 Units Subcutaneous Given 06/21/19 0918)  atorvastatin (LIPITOR) tablet 80 mg (80 mg Oral Given 06/21/19 1021)  carvedilol (COREG) tablet 25 mg (25 mg Oral Given 06/21/19 1022)  aspirin EC tablet 81 mg (81 mg Oral Not Given 06/21/19 0631)  DULoxetine (CYMBALTA) DR capsule 30 mg (30 mg Oral Given 06/21/19 1021)  pentoxifylline (TRENTAL) CR tablet 400 mg (400 mg Oral Given 06/21/19 1020)  levothyroxine (SYNTHROID) tablet 100 mcg (100 mcg Oral Not Given 06/21/19 0631)  amLODipine (NORVASC) tablet 10 mg (10 mg Oral Given 06/21/19 1022)  traMADol (ULTRAM) tablet 50 mg (has no administration in time range)  heparin bolus via infusion 4,000 Units (4,000 Units Intravenous Bolus from Bag 06/21/19 0631)    Followed by  heparin ADULT infusion 100 units/mL (25000 units/22mL sodium chloride 0.45%) (1,000 Units/hr Intravenous New Bag/Given 06/21/19 0630)  furosemide (LASIX) injection 80 mg (has no administration in time range)  dexamethasone (DECADRON) injection 10 mg (10 mg Intravenous Given 06/21/19 0158)  ipratropium-albuterol (DUONEB) 0.5-2.5 (3) MG/3ML nebulizer solution 3 mL (3 mLs  Nebulization Given 06/21/19 0159)  ipratropium-albuterol (DUONEB) 0.5-2.5 (3) MG/3ML nebulizer solution 3 mL (3 mLs Nebulization Given 06/21/19 0159)  cefTRIAXone (ROCEPHIN) 1 g in sodium chloride 0.9 % 100 mL IVPB (0 g Intravenous Stopped 06/21/19 0309)  azithromycin (ZITHROMAX) 500 mg in sodium chloride 0.9 % 250 mL IVPB (0 mg Intravenous Stopped 06/21/19 0351)    Mobility walks Low fall risk   Focused Assessments Pulmonary Assessment Handoff:  Lung sounds: Bilateral Breath Sounds: Expiratory wheezes O2 Device: Nasal Cannula O2 Flow Rate (L/min): 4 L/min      R Recommendations: See Admitting Provider Note  Report given to:   Additional Notes: arf with positive troponins. Had ultrasound of kidneys and his echocardiogram.

## 2019-06-21 NOTE — Progress Notes (Signed)
PT Cancellation Note  Patient Details Name: Bryce Garcia MRN: [id number] DOB: Dec 30, 1950   Cancelled Treatment:    Reason Eval/Treat Not Completed: Medical issues which prohibited therapy; Pt's BG 202 at 0907 and now 384 falling outside guidelines for participation with PT services.  Will continue to follow pt and attempt PT evaluation once the pt is medically appropriate.     Linus Salmons PT, DPT 06/21/19, 4:43 PM

## 2019-06-21 NOTE — ED Provider Notes (Signed)
[facility] Emergency Department Provider Note  ____________________________________________   First MD Initiated Contact with Patient 06/21/19 [phone]     (approximate)  I have reviewed the triage vital signs and the nursing notes.   HISTORY  Chief Complaint Shortness of Breath    HPI Bryce Garcia is a 68 y.o. male with below list of previous medical conditions including chronic systolic congestive heart failure, COPD chronic kidney disease history of CABG, COVID-19 in November 2020 presents to the emergency department secondary to respiratory distress.  Patient admits to progressive dyspnea over the course of yesterday.  Patient denies any fever.  Patient denies any chest pain.  Per EMS on their arrival patient's oxygen saturation was in the 70s.  EMS administered albuterol treatment before arrival.  On arrival to emergency department patient's oxygen saturation 68% with tachypnea.        Past Medical History:  Diagnosis Date  . COPD (chronic obstructive pulmonary disease) ([place])   . Diabetes mellitus without complication [facility])     Patient Active Problem List   Diagnosis Date Noted  . CAP (community acquired pneumonia) 06/21/2019  . Acute respiratory failure with hypoxia ([place]) 06/21/2019  . Venous ulcer of left leg ([place]) 06/21/2019  . COPD with chronic bronchitis ([place]) 06/21/2019  . CKD (chronic kidney disease) stage 4, GFR 15-29 ml/min (HCC) 06/21/2019  . AKI (acute kidney injury) ([place]) 06/21/2019  . Chronic systolic CHF (congestive heart failure) ([place]) 06/21/2019  . History of 2019 novel coronavirus disease (COVID-19) 06/21/2019  . Anemia 06/21/2019  . Acute respiratory failure ([place]) 06/21/2019  . Hyperglycemia due to type 1 diabetes mellitus ([place]) 06/21/2019  . Hx of CABG 06/21/2019    History reviewed. No pertinent surgical history.  Prior to Admission medications   Medication Sig Start Date End Date Taking? Authorizing Provider    amLODipine (NORVASC) 10 MG tablet Take 10 mg by mouth daily. 04/09/19  Yes [provider]  aspirin 81 MG EC tablet Take by mouth. 12/08/06  Yes [provider]  atorvastatin (LIPITOR) 80 MG tablet Take 80 mg by mouth daily. 04/30/19  Yes [provider]  carvedilol (COREG) 25 MG tablet Take 25 mg by mouth 2 (two) times daily. 04/05/19  Yes [provider]  clopidogrel (PLAVIX) 75 MG tablet Take 75 mg by mouth daily. 04/30/19  Yes [provider]  DULoxetine (CYMBALTA) 30 MG capsule Take 30 mg by mouth daily. 05/22/19  Yes [provider]  EUTHYROX 100 MCG tablet Take 100 mcg by mouth daily. 03/18/19  Yes [provider]  furosemide (LASIX) 80 MG tablet Take 80 mg by mouth daily. 04/30/19  Yes [provider]  gabapentin (NEURONTIN) 300 MG capsule Take 300 mg by mouth 2 (two) times daily. 06/15/19  Yes [provider]  insulin NPH Human (NOVOLIN N) 100 UNIT/ML injection Inject 5 units sq qam and 19 units sq q hs, adjust as directed up to 30 units daily 04/17/18  Yes [provider]  insulin regular (NOVOLIN R) 100 units/mL injection Inject 5-20 Units into the skin 3 (three) times daily before meals. Depending on blood glucose level 04/17/18  Yes [provider]  pentoxifylline (TRENTAL) 400 MG CR tablet Take 400 mg by mouth 3 (three) times daily. 06/12/19  Yes [provider]  traMADol (ULTRAM) 50 MG tablet Take 50 mg by mouth every 6 (six) hours as needed. 05/17/19  Yes [provider]    Allergies Other  No family history  on file.  Social History Social History   Tobacco Use  . Smoking status: Never Smoker  . Smokeless tobacco: Never Used  Substance Use Topics  . Alcohol use: Not Currently  . Drug use: Never    Review of Systems Constitutional: No fever/chills Eyes: No visual changes. ENT: No sore throat. Cardiovascular: Denies chest pain. Respiratory: Positive for  shortness of breath. Gastrointestinal: No abdominal pain.  No nausea, no vomiting.  No diarrhea.  No constipation. Genitourinary: Negative for dysuria. Musculoskeletal: Negative for neck pain.  Negative for back pain. Integumentary: Negative for rash. Neurological: Negative for headaches, focal weakness or numbness.   ____________________________________________   PHYSICAL EXAM:  VITAL SIGNS: ED Triage Vitals  Enc Vitals Group     BP 06/21/19 0149 135/75     Pulse Rate 06/21/19 0149 90     Resp 06/21/19 0149 (!) 26     Temp 06/21/19 0149 99 F (37.2 C)     Temp Source 06/21/19 0149 Oral     SpO2 06/21/19 0149 (!) 68 %     Weight 06/21/19 0150 88.5 kg (195 lb)     Height 06/21/19 0150 1.727 m (5\' 8" )     Head Circumference --      Peak Flow --      Pain Score 06/21/19 0150 0     Pain Loc --      Pain Edu? --      Excl. in [place]? --     Constitutional: Alert and oriented.  Apparent respiratory distress Eyes: Conjunctivae are normal.  Mouth/Throat: Patient is wearing a mask. Neck: No stridor.  No meningeal signs.   Cardiovascular: Normal rate, regular rhythm. Good peripheral circulation. Grossly normal heart sounds. Respiratory: Tachypnea, positive accessory respiratory muscle use, diffuse rhonchi Gastrointestinal: Soft and nontender. No distention. :  Musculoskeletal: No lower extremity tenderness nor edema. No gross deformities of extremities. Neurologic:  Normal speech and language. No gross focal neurologic deficits are appreciated.  Skin:  Skin is warm, dry and intact. Psychiatric: Mood and affect are normal. Speech and behavior are normal.  ____________________________________________   LABS (all labs ordered are listed, but only abnormal results are displayed)  Labs Reviewed  CBC WITH DIFFERENTIAL/PLATELET - Abnormal; Notable for the following components:      Result Value   WBC 12.5 (*)    RBC 3.13 (*)    Hemoglobin 9.1 (*)    HCT 28.3 (*)    Neutro Abs 10.2  (*)    Monocytes Absolute 1.1 (*)    All other components within normal limits  BASIC METABOLIC PANEL - Abnormal; Notable for the following components:   CO2 21 (*)    Glucose, Bld 233 (*)    BUN 55 (*)    Creatinine, Ser 3.88 (*)    Calcium 8.7 (*)    GFR calc non Af Amer 15 (*)    GFR calc Af Amer 18 (*)    All other components within normal limits  BRAIN NATRIURETIC PEPTIDE - Abnormal; Notable for the following components:   B Natriuretic Peptide 1,167.0 (*)    All other components within normal limits  BLOOD GAS, VENOUS - Abnormal; Notable for the following components:   pO2, Ven 60.0 (*)    All other components within normal limits  TROPONIN I (HIGH SENSITIVITY) - Abnormal; Notable for the following components:   Troponin I (High Sensitivity) 1,369 (*)    All other components within normal limits  TROPONIN I (HIGH SENSITIVITY) - Abnormal;  Notable for the following components:   Troponin I (High Sensitivity) 2,780 (*)    All other components within normal limits  CULTURE, BLOOD (ROUTINE X 2)  CULTURE, BLOOD (ROUTINE X 2)  URINE CULTURE  EXPECTORATED SPUTUM ASSESSMENT W REFEX TO RESP CULTURE  RESPIRATORY PANEL BY PCR  LACTIC ACID, PLASMA  LACTIC ACID, PLASMA  PROTIME-INR  URINALYSIS, ROUTINE W REFLEX MICROSCOPIC  HIV ANTIBODY (ROUTINE TESTING W REFLEX)  HEMOGLOBIN A1C  HEMOGLOBIN A1C  PROCALCITONIN  POC SARS CORONAVIRUS 2 AG -  ED  POC SARS CORONAVIRUS 2 AG  ABO/RH  TROPONIN I (HIGH SENSITIVITY)   ____________________________________________  EKG  ED ECG REPORT I, [place] Yovanny Coats, the attending physician, personally viewed and interpreted this ECG.   Date: 06/21/2019  EKG Time: 1:53 AM  Rate: 89  Rhythm: Sinus rhythm with left bundle branch block  Axis: Leftward axis  Intervals: Normal  ST&T Change: None  ____________________________________________  RADIOLOGY I, [place] Reiss Mowrey, personally viewed and evaluated these images (plain radiographs) as part  of my medical decision making, as well as reviewing the written report by the radiologist.  ED MD interpretation: Diffuse interstitial and multifocal areas of airspace opacities with more dense consolidative opacities in the left midlung compatible with multifocal pneumonia per radiologist.   Official radiology report(s): DG Chest Portable 1 View  Result Date: 06/21/2019 CLINICAL DATA:  COVID-19 diagnosed 1 month prior, abnormal breathing since EXAM: PORTABLE CHEST 1 VIEW COMPARISON:  Chest radiograph 01/07/2014 FINDINGS: Diffuse interstitial and multifocal areas of airspace opacity with more dense consolidative opacity in the left mid lung. No pneumothorax. Small bilateral effusions. Prominence of the cardiac silhouette likely related to low volumes and portable technique. Postsurgical changes related to prior CABG including intact and aligned sternotomy wires and multiple surgical clips projecting over the mediastinum. No acute osseous or soft tissue abnormality. Telemetry leads overlie the chest. IMPRESSION: 1. Diffuse interstitial and multifocal areas of airspace opacity with more dense consolidative opacity in the left mid lung, compatible with multifocal pneumonia. 2. Small bilateral effusions. Electronically Signed   By: Lovena Le M.D.   On: 06/21/2019 02:47    ____________________________________________   PROCEDURES     .Critical Care Performed by: Gregor Hams, MD Authorized by: Gregor Hams, MD   Critical care provider statement:    Critical care time (minutes):  30   Critical care time was exclusive of:  Separately billable procedures and treating other patients   Critical care was necessary to treat or prevent imminent or life-threatening deterioration of the following conditions:  Respiratory failure   Critical care was time spent personally by me on the following activities:  Development of treatment plan with patient or surrogate, discussions with consultants,  evaluation of patient's response to treatment, examination of patient, obtaining history from patient or surrogate, ordering and performing treatments and interventions, ordering and review of laboratory studies, ordering and review of radiographic studies, pulse oximetry, re-evaluation of patient's condition and review of old charts     ____________________________________________   INITIAL IMPRESSION / MDM / [place] / ED COURSE  As part of my medical decision making, I reviewed the following data within the electronic MEDICAL RECORD NUMBER   68 year old male presented with above-stated history and physical exam with differential diagnosis including but not limited to pneumonia, CHF exacerbation, CAD/MI, COVID-19 reinfection or adverse sequelae thereof.  Patient received 2 duo nebs and 10 mg of Decadron IV currently satting 98% on 4 L via nasal cannula.  Work of breathing much improved.  Chest x-ray consistent with multifocal pneumonia per radiologist.  As such patient received IV ceftriaxone 1 g and azithromycin 500 mg IV.  In addition patient's high-sensitivity troponin noted to be elevated at 1369.  Patient discussed with Dr. Damita Dunnings for hospital admission for further evaluation and management.      ____________________________________________  FINAL CLINICAL IMPRESSION(S) / ED DIAGNOSES  Final diagnoses:  Acute respiratory failure with hypoxia ([place])  Community acquired pneumonia, unspecified laterality     MEDICATIONS GIVEN DURING THIS VISIT:  Medications  enoxaparin (LOVENOX) injection 30 mg (has no administration in time range)  sodium chloride flush (NS) 0.9 % injection 3 mL (3 mLs Intravenous Not Given 06/21/19 0354)  sodium chloride flush (NS) 0.9 % injection 3 mL (has no administration in time range)  0.9 %  sodium chloride infusion (has no administration in time range)  cefTRIAXone (ROCEPHIN) 2 g in sodium chloride 0.9 % 100 mL IVPB (has no administration in time  range)  azithromycin (ZITHROMAX) 500 mg in sodium chloride 0.9 % 250 mL IVPB (has no administration in time range)  albuterol (VENTOLIN HFA) 108 (90 Base) MCG/ACT inhaler 2 puff (2 puffs Inhalation Given 06/21/19 0415)  dexamethasone (DECADRON) injection 6 mg (6 mg Intravenous Given 06/21/19 0415)  insulin aspart (novoLOG) injection 0-15 Units (has no administration in time range)  dexamethasone (DECADRON) injection 10 mg (10 mg Intravenous Given 06/21/19 0158)  ipratropium-albuterol (DUONEB) 0.5-2.5 (3) MG/3ML nebulizer solution 3 mL (3 mLs Nebulization Given 06/21/19 0159)  ipratropium-albuterol (DUONEB) 0.5-2.5 (3) MG/3ML nebulizer solution 3 mL (3 mLs Nebulization Given 06/21/19 0159)  cefTRIAXone (ROCEPHIN) 1 g in sodium chloride 0.9 % 100 mL IVPB (0 g Intravenous Stopped 06/21/19 0309)  azithromycin (ZITHROMAX) 500 mg in sodium chloride 0.9 % 250 mL IVPB (0 mg Intravenous Stopped 06/21/19 0351)     ED Discharge Orders    None      *Please note:  Bryce Garcia was evaluated in Emergency Department on 06/21/2019 for the symptoms described in the history of present illness. He was evaluated in the context of the global COVID-19 pandemic, which necessitated consideration that the patient might be at risk for infection with the SARS-CoV-2 virus that causes COVID-19. Institutional protocols and algorithms that pertain to the evaluation of patients at risk for COVID-19 are in a state of rapid change based on information released by regulatory bodies including the CDC and federal and state organizations. These policies and algorithms were followed during the patient's care in the ED.  Some ED evaluations and interventions may be delayed as a result of limited staffing during the pandemic.*  Note:  This document was prepared using Dragon voice recognition software and may include unintentional dictation errors.   Gregor Hams, MD 06/21/19 [phone]

## 2019-06-21 NOTE — H&P (Addendum)
History and Physical    Bryce Garcia [id number] DOB: 1950/11/30 DOA: 06/21/2019  PCP: Hill, Rea  Patient coming from: home  I have personally briefly reviewed patient's old medical records in [place]  Chief Complaint: shortness of breath  HPI: Bryce Garcia is a 68 y.o. male with medical history significant type 1 diabetes, CAD status post CABG, systolic heart failure, last EF 50%, CKD 4, type 1 diabetes, hypertension, who had COVID-19 infection over 4 weeks ago from which he states he had completely resolved, who presents with a 4-day history of progressively worsening shortness of breath.  Denies chest pain and denies lower extremity pain or swelling.  He denies having a cough or fever.  Denies nausea vomiting or diaphoresis, denies abdominal pain or change in bowel habits.  On arrival in the emergency room his O2 sat was 68% on room air, improving to 94% on 4 L.  Temperature was 99 with blood pressure 157/62 heart rate 83 and respirations 18/min.  His chest x-ray showed multifocal airspace opacities with consolidation of the left midlung.  On his blood work BNP was elevated at 1167.  EKG showed no acute ST-T wave changes.  Other blood work remarkable for creatinine of 3.88 up from his baseline of 2.87, blood sugar of 233, hemoglobin of 9.1, down from baseline of 11.7 and white cell count of 12.5.  Baseline labs were obtained from review of care everywhere from [facility] where patient gets most of his care.  Repeat Covid test in the ER was negative.  She was started on Rocephin and azithromycin and hospitalization requested.  Status discussed and patient elects to be DNR, confirmed with son on phone.  Review of Systems: As per HPI otherwise 10 point review of systems negative.  Does have an ulcer LLE that is followed by wound care at [facility]  Past Medical History:  Diagnosis Date  . COPD (chronic obstructive pulmonary disease) ([place])   .  Diabetes mellitus without complication ([place])     History reviewed. No pertinent surgical history.   reports that he has never smoked. He has never used smokeless tobacco. He reports previous alcohol use. He reports that he does not use drugs.  No Known Allergies  No family history on file.  Family history of hypertension   Prior to Admission medications   Not on File    Physical Exam: Vitals:   06/21/19 0200 06/21/19 0215 06/21/19 0230 06/21/19 0231  BP:    (!) 157/62  Pulse: 86 86 80 83  Resp: (!) 26 (!) 21 (!) 21 18  Temp:      TempSrc:      SpO2: 93% 95% 93% 95%  Weight:      Height:         Vitals:   06/21/19 0200 06/21/19 0215 06/21/19 0230 06/21/19 0231  BP:    (!) 157/62  Pulse: 86 86 80 83  Resp: (!) 26 (!) 21 (!) 21 18  Temp:      TempSrc:      SpO2: 93% 95% 93% 95%  Weight:      Height:      Constitutional: NAD, calm, comfortable Eyes: PERRL, lids and conjunctivae normal ENMT: Mucous membranes are moist.  Neck: normal, supple, no masses, no thyromegaly no JVD Respiratory: bilateral wheezes and crackles crackles L>R. Tachypnea but without acessory muscle use Cardiovascular: Regular rate and rhythm, no murmurs / rubs / gallops. No extremity edema. 2+  pedal pulses. No carotid bruits.  Abdomen: no tenderness, no masses palpated. No hepatosplenomegaly. Bowel sounds positive.  Musculoskeletal: no clubbing / cyanosis. No joint deformity upper and lower extremities. Good ROM, no contractures. Normal muscle tone.  Skin:  Ulcer LLE in unna boot. No edema or calf tenderness Neurologic: CN 2-12 grossly intact. Sensation intact, DTR normal. Strength 5/5 in all 4.  Psychiatric: Normal judgment and insight. Alert and oriented x 3. Normal mood.     Labs on Admission: I have personally reviewed following labs and imaging studies  CBC: Recent Labs  Lab 06/21/19 0154  WBC 12.5*  NEUTROABS 10.2*  HGB 9.1*  HCT 28.3*  MCV 90.4  PLT 0000000   Basic Metabolic  Panel: Recent Labs  Lab 06/21/19 0154  NA 139  K 4.8  CL 104  CO2 21*  GLUCOSE 233*  BUN 55*  CREATININE 3.88*  CALCIUM 8.7*   GFR: Estimated Creatinine Clearance: 20.2 mL/min (A) (by C-G formula based on SCr of 3.88 mg/dL (H)). Liver Function Tests: No results for input(s): AST, ALT, ALKPHOS, BILITOT, PROT, ALBUMIN in the last 168 hours. No results for input(s): LIPASE, AMYLASE in the last 168 hours. No results for input(s): AMMONIA in the last 168 hours. Coagulation Profile: Recent Labs  Lab 06/21/19 0155  INR 1.0   Cardiac Enzymes: No results for input(s): CKTOTAL, CKMB, CKMBINDEX, TROPONINI in the last 168 hours. BNP (last 3 results) No results for input(s): PROBNP in the last 8760 hours. HbA1C: No results for input(s): HGBA1C in the last 72 hours. CBG: No results for input(s): GLUCAP in the last 168 hours. Lipid Profile: No results for input(s): CHOL, HDL, LDLCALC, TRIG, CHOLHDL, LDLDIRECT in the last 72 hours. Thyroid Function Tests: No results for input(s): TSH, T4TOTAL, FREET4, T3FREE, THYROIDAB in the last 72 hours. Anemia Panel: No results for input(s): VITAMINB12, FOLATE, FERRITIN, TIBC, IRON, RETICCTPCT in the last 72 hours. Urine analysis:    Component Value Date/Time   COLORURINE Yellow 01/07/2014 1900   APPEARANCEUR Clear 01/07/2014 1900   LABSPEC 1.015 01/07/2014 1900   PHURINE 6.0 01/07/2014 1900   GLUCOSEU >=500 01/07/2014 1900   HGBUR Negative 01/07/2014 1900   BILIRUBINUR Negative 01/07/2014 1900   KETONESUR Negative 01/07/2014 1900   PROTEINUR >=500 01/07/2014 1900   NITRITE Negative 01/07/2014 1900   LEUKOCYTESUR Negative 01/07/2014 1900    Radiological Exams on Admission: DG Chest Portable 1 View  Result Date: 06/21/2019 CLINICAL DATA:  COVID-19 diagnosed 1 month prior, abnormal breathing since EXAM: PORTABLE CHEST 1 VIEW COMPARISON:  Chest radiograph 01/07/2014 FINDINGS: Diffuse interstitial and multifocal areas of airspace opacity  with more dense consolidative opacity in the left mid lung. No pneumothorax. Small bilateral effusions. Prominence of the cardiac silhouette likely related to low volumes and portable technique. Postsurgical changes related to prior CABG including intact and aligned sternotomy wires and multiple surgical clips projecting over the mediastinum. No acute osseous or soft tissue abnormality. Telemetry leads overlie the chest. IMPRESSION: 1. Diffuse interstitial and multifocal areas of airspace opacity with more dense consolidative opacity in the left mid lung, compatible with multifocal pneumonia. 2. Small bilateral effusions. Electronically Signed   By: Lovena Le M.D.   On: 06/21/2019 02:47    EKG: Independently reviewed.   Assessment/Plan Principal Problem:  NSTEMI (addendum) -- Troponin 1369>> 2680 --Possibly related to demand ischemia from acute hypoxic respiratory failure and may be elevated given poor renal function --Heparin infusion, antiplatelets, statins.  --Cardiology consult --Echocardiogram in the a.m.  Acute respiratory failure with hypoxia (HCC) --Most likely related to community-acquired pneumonia but differential does include mild exacerbation of CHF in view of small bilateral effusions and BNP of over thousand --O2 sat was 68% on room air on arrival improving to 94% on 4 L patient has no prior history of home oxygen use --Supplemental oxygen to keep sats over 90% --Treat each etiology as outlined below  Community acquired pneumonia in the setting of recent history of COVID-19 infection-- --Patient has no prior history of supplemental oxygen use --- Chest x-ray showing multifocal pneumonia with consolidative opacity in the left midlung --Repeat Covid test from emergency room dated.  respiratory panel pending --IV Rocephin and azithromycin for 5 to 7 days --Albuterol every 6 hours and as needed --Supplemental oxygen to keep sats over 90% --Blood cultures.  Sputum culture  if available   Acute on Chronic systolic CHF (congestive heart failure) ([place])  --Patient followed at [facility].  Last EF on file was 50% on 08/2018 improved from 40% --BNP elevated at 1.167.  Chest x-ray Garcia small bilateral pleural effusion --serial troponin expected to be elevated given renal function( 1369>>2680) --Daily weights with salt and fluid restriction --Patient does not appear acutely fluid overloaded so we will hold off on IV diuretic therapy especially in view of acute kidney injury and stage IV kidney disease     Anemia, uncertain acuity --Hemoglobin 9.1.  Most hemoglobin on file on chart everywhere was 11.7 --Suspect related to worsening kidney function --Stool for occult blood to evaluate for GI etiology --Monitor H&H    Hyperglycemia due to type 1 diabetes mellitus ([place]) --Blood sugar 391 --Supplemental insulin coverage for now pending verification of home meds --IMA globin A1c    Venous ulcer of left leg ([place]) --Recently seen by PCP on 06/19/2019 --Daily wound care    COPD with chronic bronchitis ([place]) --Does not appear to be acutely exacerbated --Albuterol every 6 and as needed pending home meds conciliation  Acute kidney injury superimposed on CKD (chronic kidney disease) stage 4, GFR 15-29 ml/min (HCC) --BUN creatinine of 55/3.88, most recent creatinine of 2.85 in June 2020 per Care Everywhere --Monitor renal function --Nephrology consult  History of CABG --Patient has history of CABG in 2005  --Complaints at chest pain at this time --For review and initiation of home meds when available    DVT prophylaxis: lovenox Code Status: DNR Family Communication: with son Dekota Wasley who confirms that patient has mentioned in the past thathis father elects to be DNR Disposition Plan: home Admission status: inpatient   Athena Masse MD [facility]   If 7PM-7AM, please contact night-coverage [email or web address] Password [facility]  06/21/2019, 3:43 AM

## 2019-06-21 NOTE — ED Notes (Signed)
Patient sat up in bed and eating breakfast tray

## 2019-06-21 NOTE — Progress Notes (Signed)
CODE SEPSIS - PHARMACY COMMUNICATION  **Broad Spectrum Antibiotics should be administered within 1 hour of Sepsis diagnosis**  Time Code Sepsis Called/Page Received: 0212  Antibiotics Ordered: Rocephin and Zithromax  Time of 1st antibiotic administration: 0232  Additional action taken by pharmacy: n/a  If necessary, Name of Provider/Nurse Contacted: n/a    Ena Dawley ,PharmD Clinical Pharmacist  06/21/2019  2:14 AM

## 2019-06-21 NOTE — Progress Notes (Signed)
PROGRESS NOTE    Bryce Garcia  [id number] DOB: January 20, 1951 DOA: 06/21/2019 PCP: [place]    Brief Narrative:  Bryce Garcia is a 68 y.o. male with medical history significant type 1 diabetes, CAD status post CABG, systolic heart failure, last EF 50%, CKD 4, type 1 diabetes, hypertension, who had COVID-19 infection over 4 weeks ago from which he states he had completely resolved, who presents with a 4-day history of progressively worsening shortness of breath.  Denies chest pain and denies lower extremity pain or swelling.  He denies having a cough or fever.  Denies nausea vomiting or diaphoresis, denies abdominal pain or change in bowel habits.  On arrival in the emergency room his O2 sat was 68% on room air, improving to 94% on 4 L.  Temperature was 99 with blood pressure 157/62 heart rate 83 and respirations 18/min.  His chest x-ray showed multifocal airspace opacities with consolidation of the left midlung.  On his blood work BNP was elevated at 1167.  EKG showed no acute ST-T wave changes.  Other blood work remarkable for creatinine of 3.88 up from his baseline of 2.87, blood sugar of 233, hemoglobin of 9.1, down from baseline of 11.7 and white cell count of 12.5.  Baseline labs were obtained from review of care everywhere from [facility] where patient gets most of his care.  Repeat Covid test in the ER was negative.  She was started on Rocephin and azithromycin and hospitalization requested.  Status discussed and patient elects to be DNR, confirmed with son on phone.    Consultants:   Cardiology, nephrology  Procedures: None  Antimicrobials:   Azithromycin and ceftriaxone   Subjective: Feeling better.  States his shortness of breath is improving.  No chest pain  Objective: Vitals:   06/21/19 1100 06/21/19 1230 06/21/19 1300 06/21/19 1401  BP: 136/68 118/60 139/65 (!) 139/99  Pulse: 73 70 72 77  Resp: 16 17 19 18   Temp:    98 F (36.7  C)  TempSrc:    Oral  SpO2: 93% 92% 95% 98%  Weight:      Height:        Intake/Output Summary (Last 24 hours) at 06/21/2019 1600 Last data filed at 06/21/2019 0351 Gross per 24 hour  Intake 390 ml  Output --  Net 390 ml   Filed Weights   06/21/19 0150  Weight: 88.5 kg    Examination:  General exam: Appears calm and comfortable, NAD Respiratory system: Clear to auscultation. Respiratory effort normal.  No rales rhonchi's Cardiovascular system: S1 & S2 heard, RRR. No JVD, murmurs, rubs, gallops or clicks. Gastrointestinal system: Abdomen is nondistended, soft and nontender.. Normal bowel sounds heard. Central nervous system: Alert and oriented. No focal neurological deficits. Extremities: Mild bilateral lower extremity edema with chronic skin changes Skin: Warm dry Psychiatry: Judgement and insight appear normal. Mood & affect appropriate.     Data Reviewed: I have personally reviewed following labs and imaging studies  CBC: Recent Labs  Lab 06/21/19 0154  WBC 12.5*  NEUTROABS 10.2*  HGB 9.1*  HCT 28.3*  MCV 90.4  PLT 0000000   Basic Metabolic Panel: Recent Labs  Lab 06/21/19 0154  NA 139  K 4.8  CL 104  CO2 21*  GLUCOSE 233*  BUN 55*  CREATININE 3.88*  CALCIUM 8.7*   GFR: Estimated Creatinine Clearance: 20.2 mL/min (A) (by C-G formula based on SCr of 3.88 mg/dL (H)). Liver Function Tests:  No results for input(s): AST, ALT, ALKPHOS, BILITOT, PROT, ALBUMIN in the last 168 hours. No results for input(s): LIPASE, AMYLASE in the last 168 hours. No results for input(s): AMMONIA in the last 168 hours. Coagulation Profile: Recent Labs  Lab 06/21/19 0155  INR 1.0   Cardiac Enzymes: No results for input(s): CKTOTAL, CKMB, CKMBINDEX, TROPONINI in the last 168 hours. BNP (last 3 results) No results for input(s): PROBNP in the last 8760 hours. HbA1C: Recent Labs    06/21/19 0412  HGBA1C 6.8*   CBG: Recent Labs  Lab 06/21/19 0907 06/21/19 1536   GLUCAP 202* 384*   Lipid Profile: No results for input(s): CHOL, HDL, LDLCALC, TRIG, CHOLHDL, LDLDIRECT in the last 72 hours. Thyroid Function Tests: No results for input(s): TSH, T4TOTAL, FREET4, T3FREE, THYROIDAB in the last 72 hours. Anemia Panel: No results for input(s): VITAMINB12, FOLATE, FERRITIN, TIBC, IRON, RETICCTPCT in the last 72 hours. Sepsis Labs: Recent Labs  Lab 06/21/19 0155 06/21/19 0412 06/21/19 0433  PROCALCITON  --   --  0.30  LATICACIDVEN 1.4 0.8  --     Recent Results (from the past 240 hour(s))  Blood Culture (routine x 2)     Status: None (Preliminary result)   Collection Time: 06/21/19  2:08 AM   Specimen: BLOOD  Result Value Ref Range Status   Specimen Description BLOOD BLOOD RIGHT FOREARM  Final   Special Requests   Final    BOTTLES DRAWN AEROBIC AND ANAEROBIC Blood Culture adequate volume   Culture   Final    NO GROWTH < 12 HOURS Performed at [facility], [address], [place], [place] [address]    Report Status PENDING  Incomplete  Blood Culture (routine x 2)     Status: None (Preliminary result)   Collection Time: 06/21/19  2:13 AM   Specimen: BLOOD  Result Value Ref Range Status   Specimen Description BLOOD LEFT ANTECUBITAL  Final   Special Requests   Final    BOTTLES DRAWN AEROBIC AND ANAEROBIC Blood Culture results may not be optimal due to an inadequate volume of blood received in culture bottles   Culture   Final    NO GROWTH < 12 HOURS Performed at [facility], [address], [place], [place] [address]    Report Status PENDING  Incomplete         Radiology Studies: US RENAL  Result Date: 06/21/2019 CLINICAL DATA:  Acute kidney injury. Stage 4 chronic kidney disease. EXAM: RENAL / URINARY TRACT ULTRASOUND COMPLETE COMPARISON:  Chest CTA dated 10/27/2013. FINDINGS: Right Kidney: Renal measurements: 9.5 x 4.6 x 4.6 cm = volume: 106 mL. Mildly echogenic. No hydronephrosis. Diffuse cortical thinning with  prominent renal sinus fat. Left Kidney: Renal measurements: 8.7 x 4.6 x 4.5 cm = volume: 94 mL. Borderline echogenic. No hydronephrosis. Bladder: Appears normal for degree of bladder distention. Other: None. IMPRESSION: 1. Mildly echogenic right kidney and borderline echogenic left kidney, compatible with medical renal disease. 2. No hydronephrosis. 3. Bilateral renal atrophy, greater on the right. Electronically Signed   By: Claudie Revering M.D.   On: 06/21/2019 11:56   DG Chest Portable 1 View  Result Date: 06/21/2019 CLINICAL DATA:  COVID-19 diagnosed 1 month prior, abnormal breathing since EXAM: PORTABLE CHEST 1 VIEW COMPARISON:  Chest radiograph 01/07/2014 FINDINGS: Diffuse interstitial and multifocal areas of airspace opacity with more dense consolidative opacity in the left mid lung. No pneumothorax. Small bilateral effusions. Prominence of the cardiac silhouette likely related to  low volumes and portable technique. Postsurgical changes related to prior CABG including intact and aligned sternotomy wires and multiple surgical clips projecting over the mediastinum. No acute osseous or soft tissue abnormality. Telemetry leads overlie the chest. IMPRESSION: 1. Diffuse interstitial and multifocal areas of airspace opacity with more dense consolidative opacity in the left mid lung, compatible with multifocal pneumonia. 2. Small bilateral effusions. Electronically Signed   By: Lovena Le M.D.   On: 06/21/2019 02:47   ECHOCARDIOGRAM COMPLETE  Result Date: 06/21/2019   ECHOCARDIOGRAM REPORT   Patient Name:   SIYON BAGGOTT Date of Exam: 06/21/2019 Medical Rec #:  [id number]      Height:       68.0 in Accession #:    [id number]     Weight:       195.0 lb Date of Birth:  [birth year]      BSA:          2.02 m Patient Age:    65 years       BP:           139/65 mmHg Patient Gender: M              HR:           72 bpm. Exam Location:  [facility] Procedure: 2D Echo, Cardiac Doppler and Color Doppler Indications:     NSTEMI  I21.4  History:         Patient has no prior history of Echocardiogram examinations.                  CAD; Arrythmias:LBBB.  Sonographer:     Alyse Low Roar Referring Phys:  [id number] Athena Masse Diagnosing Phys: Ida Rogue MD IMPRESSIONS  1. Left ventricular ejection fraction, by visual estimation, is 50 to 55%. The left ventricle has normal function. There is no left ventricular hypertrophy.  2. Left ventricular diastolic parameters are consistent with Grade II diastolic dysfunction (pseudonormalization).  3. The left ventricle has no regional wall motion abnormalities.  4. Global right ventricle has normal systolic function.The right ventricular size is normal. No increase in right ventricular wall thickness.  5. Left atrial size was mildly dilated.  6. Moderate mitral valve regurgitation.  7. TR signal is inadequate for assessing pulmonary artery systolic pressure. FINDINGS  Left Ventricle: Left ventricular ejection fraction, by visual estimation, is 50 to 55%. The left ventricle has normal function. The left ventricle has no regional wall motion abnormalities. There is no left ventricular hypertrophy. Left ventricular diastolic parameters are consistent with Grade II diastolic dysfunction (pseudonormalization). Normal left atrial pressure. Right Ventricle: The right ventricular size is normal. No increase in right ventricular wall thickness. Global RV systolic function is has normal systolic function. Left Atrium: Left atrial size was mildly dilated. Right Atrium: Right atrial size was normal in size Pericardium: There is no evidence of pericardial effusion. Mitral Valve: The mitral valve is normal in structure. Moderate mitral valve regurgitation. No evidence of mitral valve stenosis by observation. Tricuspid Valve: The tricuspid valve is normal in structure. Tricuspid valve regurgitation is not demonstrated. Aortic Valve: The aortic valve is normal in structure. Aortic valve regurgitation is not  visualized. Mild to moderate aortic valve sclerosis/calcification is present, without any evidence of aortic stenosis. Aortic valve mean gradient measures 3.0 mmHg. Aortic valve peak gradient measures 5.9 mmHg. Aortic valve area, by VTI measures 2.43 cm. Pulmonic Valve: The pulmonic valve was normal in structure. Pulmonic valve regurgitation is not visualized. Pulmonic regurgitation  is not visualized. Aorta: The aortic root, ascending aorta and aortic arch are all structurally normal, with no evidence of dilitation or obstruction. Venous: The inferior vena cava is dilated in size with greater than 50% respiratory variability, suggesting right atrial pressure of 8 mmHg. IAS/Shunts: No atrial level shunt detected by color flow Doppler. There is no evidence of a patent foramen ovale. No ventricular septal defect is seen or detected. There is no evidence of an atrial septal defect.  LEFT VENTRICLE PLAX 2D LVIDd:         4.72 cm  Diastology LVIDs:         3.81 cm  LV e' lateral:   10.40 cm/s LV PW:         1.15 cm  LV E/e' lateral: 10.8 LV IVS:        1.02 cm  LV e' medial:    4.82 cm/s LVOT diam:     2.00 cm  LV E/e' medial:  23.2 LV SV:         41 ml LV SV Index:   19.70 LVOT Area:     3.14 cm  RIGHT VENTRICLE RV Basal diam:  2.89 cm LEFT ATRIUM            Index       RIGHT ATRIUM           Index LA diam:      4.50 cm  2.23 cm/m  RA Area:     16.60 cm LA Vol (A2C): 77.8 ml  38.48 ml/m RA Volume:   45.40 ml  22.45 ml/m LA Vol (A4C): 106.0 ml 52.43 ml/m  AORTIC VALVE                   PULMONIC VALVE AV Area (Vmax):    2.08 cm    PV Vmax:        0.98 m/s AV Area (Vmean):   2.49 cm    PV Peak grad:   3.8 mmHg AV Area (VTI):     2.43 cm    RVOT Peak grad: 1 mmHg AV Vmax:           121.00 cm/s AV Vmean:          76.100 cm/s AV VTI:            0.226 m AV Peak Grad:      5.9 mmHg AV Mean Grad:      3.0 mmHg LVOT Vmax:         80.00 cm/s LVOT Vmean:        60.300 cm/s LVOT VTI:          0.175 m LVOT/AV VTI ratio: 0.77   AORTA Ao Root diam: 2.90 cm MITRAL VALVE MV Area (PHT): 3.60 cm              SHUNTS MV PHT:        61.19 msec            Systemic VTI:  0.18 m MV Decel Time: 211 msec              Systemic Diam: 2.00 cm MV E velocity: 112.00 cm/s 103 cm/s MV A velocity: 72.20 cm/s  70.3 cm/s MV E/A ratio:  1.55        1.5  Ida Rogue MD Electronically signed by Ida Rogue MD Signature Date/Time: 06/21/2019/2:10:13 PM    Final         Scheduled Meds: . albuterol  2 puff Inhalation Q6H  . amLODipine  10 mg Oral Daily  . aspirin EC  81 mg Oral Daily  . atorvastatin  80 mg Oral Daily  . carvedilol  25 mg Oral BID  . dexamethasone (DECADRON) injection  6 mg Intravenous Q24H  . DULoxetine  30 mg Oral Daily  . furosemide  80 mg Intravenous BID  . insulin aspart  0-15 Units Subcutaneous TID WC  . levothyroxine  100 mcg Oral Daily  . pentoxifylline  400 mg Oral TID  . sodium chloride flush  3 mL Intravenous Q12H   Continuous Infusions: . sodium chloride    . azithromycin    . cefTRIAXone (ROCEPHIN)  IV    . heparin 1,300 Units/hr (06/21/19 1553)    Assessment & Plan:   Principal Problem:   Acute respiratory failure with hypoxia (HCC) Active Problems:   CAP (community acquired pneumonia)   Venous ulcer of left leg (HCC)   COPD with chronic bronchitis (HCC)   CKD (chronic kidney disease) stage 4, GFR 15-29 ml/min (HCC)   AKI (acute kidney injury) ([place])   Chronic systolic CHF (congestive heart failure) ([place])   History of 2019 novel coronavirus disease (COVID-19)   Anemia   Acute respiratory failure (HCC)   Hyperglycemia due to type 1 diabetes mellitus (HCC)   Hx of CABG   NSTEMI (non-ST elevated myocardial infarction) ([place])   NSTEMI  --In setting of acute respiratory distress and pneumonia  elevated troponin Echo with EF of 50 to 55%, grossly no wall motion abnormality Cardiology consulted input was appreciated, no need for cardiac catheterization urgently at this time especially with  minimal symptoms with coronary and renal failure above his baseline.  Will need ischemic work-up eventually Continue heparin for 48 to 72 hours, beta-blockers and aspirin, statin     Acute respiratory failure with hypoxia (HCC) --Most likely related to community-acquired pneumonia but differential does include mild exacerbation of CHF in view of small bilateral effusions and BNP of over thousand --O2 sat was 68% on room air on arrival improving to 94% on 4 L patient has no prior history of home oxygen use --Supplemental oxygen to keep sats over 90% .  Will stop Decadron as he does not require at this time Continue continue IV antibiotics And may need some IV Lasix  Community acquired pneumonia in the setting of recent history of COVID-19 infection-- --Patient has no prior history of supplemental oxygen use --- Chest x-ray showing multifocal pneumonia with consolidative opacity in the left midlung --Repeat Covid test from emergency room dated.  respiratory panel pending --IV Rocephin and azithromycin for 5 to 7 days --Albuterol every 6 hours and as needed --Supplemental oxygen to keep sats over 90% --Blood cultures.  Sputum culture if available   Acute on Chronic systolic CHF (congestive heart failure) ([place])  --Patient followed at [facility].  Last EF on file was 50% on 08/2018 improved from 40% --BNP elevated at 1.167.  Chest x-ray shows small bilateral pleural effusion --serial troponin expected to be elevated given renal function( [id number]) --Daily weights with salt and fluid restriction --Patient does not appear acutely fluid overloaded so we will hold off on IV diuretic therapy especially in view of acute kidney injury and stage IV kidney disease     Anemia, uncertain acuity --Hemoglobin 9.1.  Most hemoglobin on file on chart everywhere was 11.7 --Suspect related to worsening kidney function --Stool for occult blood to evaluate for GI etiology --Monitor H&H    Hyperglycemia  due to type 1 diabetes mellitus (HCC) --Blood sugar 391...> Stopped Decadron.  Monitor --Supplemental insulin coverage for now pending verification of home meds --IMA globin A1c    Venous ulcer of left leg (HCC) --Recently seen by PCP on 06/19/2019 --Daily wound care    COPD with chronic bronchitis ([place]) --Does not appear to be acutely exacerbated --Albuterol every 6 and as needed pending home meds conciliation D/co decadron  Acute kidney injury superimposed on CKD (chronic kidney disease) stage 4, GFR 15-29 ml/min (HCC) --BUN creatinine of 55/3.88, most recent creatinine of 2.85 in June 2020 per Care Everywhere --Monitor renal function --Nephrology input was appreciated-recommend low-salt diet and IV Lasix  History of CABG --Patient has history of CABG in 2005  --Complaints at chest pain at this time --For review and initiation of home meds when available     DVT prophylaxis Heparin Code Status: DNR Family Communication: None at bedside Disposition Plan: We will need PT OT, possible home in couple days       LOS: 0 days   Time spent: 45 minutes with more than 50% COC    Nolberto Hanlon, MD [facility] Pager [phone]  If 7PM-7AM, please contact night-coverage [email or web address] Password [email or web address] 06/21/2019, 4:00 PM

## 2019-06-21 NOTE — ED Notes (Signed)
Pt states still no urge to urinate.

## 2019-06-21 NOTE — Consult Note (Signed)
[facility]  CONSULT NOTE    Date: 06/21/2019                  Patient Name:  Bryce Garcia  MRN: [id number]  DOB: 1951-01-01  Age / Sex: 68 y.o., male         PCP: [place], [place]                 Service Requesting Consult: Dr. Kurtis Bushman                 Reason for Consult: Acute Renal Failure            History of Present Illness: Bryce Garcia is admitted to [facility] on 06/21/2019 for Acute respiratory failure ([place]) [J96.00]  Patient with increasing peripheral edema, shortness of breath and wheezing for last few days. Denies increase in salt or fluid intake. Patient increased his outpatient furosemide to twice a day with no improvement.   States at baseline he has orthopnea and sleeps in a recliner.   Denies use of nonsteroidal anti-inflammatory agents.    Medications: Outpatient medications: (Not in a hospital admission)   Current medications: Current Facility-Administered Medications  Medication Dose Route Frequency Provider Last Rate Last Admin  . 0.9 %  sodium chloride infusion  250 mL Intravenous PRN Athena Masse, MD      . albuterol (VENTOLIN HFA) 108 (90 Base) MCG/ACT inhaler 2 puff  2 puff Inhalation Q6H Athena Masse, MD   2 puff at 06/21/19 1019  . amLODipine (NORVASC) tablet 10 mg  10 mg Oral Daily Athena Masse, MD   10 mg at 06/21/19 1022  . aspirin EC tablet 81 mg  81 mg Oral Daily Judd Gaudier V, MD      . atorvastatin (LIPITOR) tablet 80 mg  80 mg Oral Daily Athena Masse, MD   80 mg at 06/21/19 1021  . azithromycin (ZITHROMAX) 500 mg in sodium chloride 0.9 % 250 mL IVPB  500 mg Intravenous Q24H Judd Gaudier V, MD      . carvedilol (COREG) tablet 25 mg  25 mg Oral BID Athena Masse, MD   25 mg at 06/21/19 1022  . cefTRIAXone (ROCEPHIN) 2 g in sodium chloride 0.9 % 100 mL IVPB  2 g Intravenous Q24H Judd Gaudier V, MD      . dexamethasone (DECADRON) injection 6 mg  6 mg Intravenous  Q24H Athena Masse, MD   6 mg at 06/21/19 0415  . DULoxetine (CYMBALTA) DR capsule 30 mg  30 mg Oral Daily Judd Gaudier V, MD   30 mg at 06/21/19 1021  . furosemide (LASIX) injection 80 mg  80 mg Intravenous BID Rubby Barbary, MD      . heparin ADULT infusion 100 units/mL (25000 units/268mL sodium chloride 0.45%)  1,000 Units/hr Intravenous Continuous Hart Robinsons A, RPH 10 mL/hr at 06/21/19 0630 1,000 Units/hr at 06/21/19 0630  . insulin aspart (novoLOG) injection 0-15 Units  0-15 Units Subcutaneous TID WC Athena Masse, MD   5 Units at 06/21/19 [phone]  . levothyroxine (SYNTHROID) tablet 100 mcg  100 mcg Oral Daily Judd Gaudier V, MD      . pentoxifylline (TRENTAL) CR tablet 400 mg  400 mg Oral TID Athena Masse, MD   400 mg at 06/21/19 1020  . sodium chloride flush (NS) 0.9 % injection 3 mL  3 mL Intravenous Q12H Athena Masse,  MD   3 mL at 06/21/19 1020  . sodium chloride flush (NS) 0.9 % injection 3 mL  3 mL Intravenous PRN Athena Masse, MD      . traMADol Veatrice Bourbon) tablet 50 mg  50 mg Oral Q6H PRN Athena Masse, MD       Current Outpatient Medications  Medication Sig Dispense Refill  . amLODipine (NORVASC) 10 MG tablet Take 10 mg by mouth daily.    Marland Kitchen aspirin 81 MG EC tablet Take by mouth.    Marland Kitchen atorvastatin (LIPITOR) 80 MG tablet Take 80 mg by mouth daily.    . carvedilol (COREG) 25 MG tablet Take 25 mg by mouth 2 (two) times daily.    . clopidogrel (PLAVIX) 75 MG tablet Take 75 mg by mouth daily.    . DULoxetine (CYMBALTA) 30 MG capsule Take 30 mg by mouth daily.    . EUTHYROX 100 MCG tablet Take 100 mcg by mouth daily.    . furosemide (LASIX) 80 MG tablet Take 80 mg by mouth daily.    Marland Kitchen gabapentin (NEURONTIN) 300 MG capsule Take 300 mg by mouth 2 (two) times daily.    . insulin NPH Human (NOVOLIN N) 100 UNIT/ML injection Inject 5 units sq qam and 19 units sq q hs, adjust as directed up to 30 units daily    . insulin regular (NOVOLIN R) 100 units/mL injection Inject 5-20  Units into the skin 3 (three) times daily before meals. Depending on blood glucose level    . pentoxifylline (TRENTAL) 400 MG CR tablet Take 400 mg by mouth 3 (three) times daily.    . traMADol (ULTRAM) 50 MG tablet Take 50 mg by mouth every 6 (six) hours as needed.        Allergies: Allergies  Allergen Reactions  . Other Other (See Comments)    Hypoglycemia unawareness Hypoglycemia unawareness       Past Medical History: Past Medical History:  Diagnosis Date  . CAD (coronary artery disease)   . CKD (chronic kidney disease), stage III   . COPD (chronic obstructive pulmonary disease) ([place])   . COVID-19   . Diabetes mellitus without complication ([place])   . HFrEF (heart failure with reduced ejection fraction) ([place])   . LBBB (left bundle branch block)   . Venous ulcer ([place])      Past Surgical History: History reviewed. No pertinent surgical history.   Family History: Family History  Problem Relation Age of Onset  . Diabetes Mother   . Hypertension Mother   . Hyperlipidemia Mother   . Cancer Father      Social History: Social History   Socioeconomic History  . Marital status: Widowed    Spouse name: Not on file  . Number of children: Not on file  . Years of education: Not on file  . Highest education level: Not on file  Occupational History  . Not on file  Tobacco Use  . Smoking status: Never Smoker  . Smokeless tobacco: Never Used  Substance and Sexual Activity  . Alcohol use: Not Currently  . Drug use: Never  . Sexual activity: Not on file  Other Topics Concern  . Not on file  Social History Narrative  . Not on file   Social Determinants of Health   Financial Resource Strain:   . Difficulty of Paying Living Expenses: Not on file  Food Insecurity:   . Worried About Charity fundraiser in the Last Year: Not on file  .  Ran Out of Food in the Last Year: Not on file  Transportation Needs:   . Lack of Transportation (Medical): Not on file  . Lack of  Transportation (Non-Medical): Not on file  Physical Activity:   . Days of Exercise per Week: Not on file  . Minutes of Exercise per Session: Not on file  Stress:   . Feeling of Stress : Not on file  Social Connections:   . Frequency of Communication with Friends and Family: Not on file  . Frequency of Social Gatherings with Friends and Family: Not on file  . Attends Religious Services: Not on file  . Active Member of Clubs or Organizations: Not on file  . Attends Archivist Meetings: Not on file  . Marital Status: Not on file  Intimate Partner Violence:   . Fear of Current or Ex-Partner: Not on file  . Emotionally Abused: Not on file  . Physically Abused: Not on file  . Sexually Abused: Not on file     Review of Systems: Review of Systems  Constitutional: Negative.  Negative for chills, diaphoresis, fever, malaise/fatigue and weight loss.  HENT: Negative.  Negative for congestion, ear discharge, ear pain, hearing loss, nosebleeds, sinus pain, sore throat and tinnitus.   Eyes: Negative.  Negative for blurred vision, double vision, photophobia, pain, discharge and redness.  Respiratory: Positive for cough, shortness of breath and wheezing. Negative for hemoptysis, sputum production and stridor.   Cardiovascular: Positive for orthopnea, leg swelling and PND. Negative for chest pain, palpitations and claudication.  Gastrointestinal: Negative.  Negative for abdominal pain, blood in stool, constipation, diarrhea, heartburn, melena, nausea and vomiting.  Genitourinary: Negative.  Negative for dysuria, flank pain, frequency, hematuria and urgency.  Musculoskeletal: Negative.  Negative for back pain, falls, joint pain, myalgias and neck pain.  Skin: Negative.  Negative for itching and rash.  Neurological: Negative.  Negative for dizziness, tingling, tremors, sensory change, speech change, focal weakness, seizures, loss of consciousness, weakness and headaches.  Endo/Heme/Allergies:  Negative.  Negative for environmental allergies and polydipsia. Does not bruise/bleed easily.  Psychiatric/Behavioral: Negative.  Negative for depression, hallucinations, memory loss, substance abuse and suicidal ideas. The patient is not nervous/anxious and does not have insomnia.     Vital Signs: Blood pressure (!) 146/81, pulse 88, temperature 99 F (37.2 C), temperature source Oral, resp. rate (!) 27, height 5\' 8"  (1.727 m), weight 88.5 kg, SpO2 93 %.  Weight trends: Filed Weights   06/21/19 0150  Weight: 88.5 kg    Physical Exam: General: NAD, sitting up in stretcher  Head: Normocephalic, atraumatic. Moist oral mucosal membranes  Eyes: Anicteric, PERRL  Neck: Supple, trachea midline  Lungs:  wheezes  Heart: Regular rate and rhythm  Abdomen:  Soft, nontender,   Extremities:  ++ peripheral edema.  Neurologic: Nonfocal, moving all four extremities  Skin: No lesions         Lab results: Basic Metabolic Panel: Recent Labs  Lab 06/21/19 0154  NA 139  K 4.8  CL 104  CO2 21*  GLUCOSE 233*  BUN 55*  CREATININE 3.88*  CALCIUM 8.7*    Liver Function Tests: No results for input(s): AST, ALT, ALKPHOS, BILITOT, PROT, ALBUMIN in the last 168 hours. No results for input(s): LIPASE, AMYLASE in the last 168 hours. No results for input(s): AMMONIA in the last 168 hours.  CBC: Recent Labs  Lab 06/21/19 0154  WBC 12.5*  NEUTROABS 10.2*  HGB 9.1*  HCT 28.3*  MCV 90.4  PLT  349    Cardiac Enzymes: No results for input(s): CKTOTAL, CKMB, CKMBINDEX, TROPONINI in the last 168 hours.  BNP: Invalid input(s): POCBNP  CBG: Recent Labs  Lab 06/21/19 0907  GLUCAP 202*    Microbiology: Results for orders placed or performed during the hospital encounter of 06/21/19  Blood Culture (routine x 2)     Status: None (Preliminary result)   Collection Time: 06/21/19  2:08 AM   Specimen: BLOOD  Result Value Ref Range Status   Specimen Description BLOOD BLOOD RIGHT FOREARM   Final   Special Requests   Final    BOTTLES DRAWN AEROBIC AND ANAEROBIC Blood Culture adequate volume   Culture   Final    NO GROWTH < 12 HOURS Performed at [facility], [address] [place], [place] [address]    Report Status PENDING  Incomplete  Blood Culture (routine x 2)     Status: None (Preliminary result)   Collection Time: 06/21/19  2:13 AM   Specimen: BLOOD  Result Value Ref Range Status   Specimen Description BLOOD LEFT ANTECUBITAL  Final   Special Requests   Final    BOTTLES DRAWN AEROBIC AND ANAEROBIC Blood Culture results may not be optimal due to an inadequate volume of blood received in culture bottles   Culture   Final    NO GROWTH < 12 HOURS Performed at [facility], [address] [place], [place] [address]    Report Status PENDING  Incomplete    Coagulation Studies: Recent Labs    06/21/19 0155  LABPROT 13.1  INR 1.0    Urinalysis: No results for input(s): COLORURINE, LABSPEC, PHURINE, GLUCOSEU, HGBUR, BILIRUBINUR, KETONESUR, PROTEINUR, UROBILINOGEN, NITRITE, LEUKOCYTESUR in the last 72 hours.  Invalid input(s): APPERANCEUR    Imaging: DG Chest Portable 1 View  Result Date: 06/21/2019 CLINICAL DATA:  COVID-19 diagnosed 1 month prior, abnormal breathing since EXAM: PORTABLE CHEST 1 VIEW COMPARISON:  Chest radiograph 01/07/2014 FINDINGS: Diffuse interstitial and multifocal areas of airspace opacity with more dense consolidative opacity in the left mid lung. No pneumothorax. Small bilateral effusions. Prominence of the cardiac silhouette likely related to low volumes and portable technique. Postsurgical changes related to prior CABG including intact and aligned sternotomy wires and multiple surgical clips projecting over the mediastinum. No acute osseous or soft tissue abnormality. Telemetry leads overlie the chest. IMPRESSION: 1. Diffuse interstitial and multifocal areas of airspace opacity with more dense consolidative opacity in the  left mid lung, compatible with multifocal pneumonia. 2. Small bilateral effusions. Electronically Signed   By: Lovena Le M.D.   On: 06/21/2019 02:47      Assessment & Plan: Bryce Garcia is a 68 y.o. white male with insulin dependent diabetes mellitus type II, hypertension, coronary artery disease status post CABG, COPD, COVID-19 infection 04/2019, chronic venous stasis ulcer, , who was admitted to [facility] on 06/21/2019 for acute exacerbation of systolic congestive heart failure. Echocardiogram 3/8 at [facility] (2-Rh) with EF of 50%.   1. Acute kidney failure with metabolic acidosis on chronic kidney disease stage IV with proteinuria: baseline creatinine of 2.85, GFR of 22 on 12/18/18.  Chronic kidney disease secondary to diabetic nephropathy. Followed by [facility], Dr. Alfonse Alpers Acute kidney injury secondary to acute cardiorenal syndrome. Not on an ACE-I/ARB at home.   2. Hypertension with acute exacerbation of systolic congestive heart failure.   3. Anemia with chronic kidney disease: normocytic. Hemoglobin of 9.1  Plan - IV furosemide - low salt diet  LOS: 0 Nekisha Mcdiarmid 12/24/202010:45 AM

## 2019-06-21 NOTE — Progress Notes (Signed)
ANTICOAGULATION CONSULT NOTE  Pharmacy Consult for Heparin Indication: chest pain/ACS  Patient Measurements: Height: 5\' 8"  (172.7 cm) Weight: 195 lb (88.5 kg) IBW/kg (Calculated) : 68.4 HEPARIN DW (KG): 86.4  Vital Signs: Temp: 99 F (37.2 C) (12/24 0149) Temp Source: Oral (12/24 0149) BP: 139/65 (12/24 1300) Pulse Rate: 72 (12/24 1300)  Labs: Recent Labs    06/21/19 0154 06/21/19 0155 06/21/19 0156 06/21/19 0412 06/21/19 0433  HGB 9.1*  --   --   --   --   HCT 28.3*  --   --   --   --   PLT 349  --   --   --   --   LABPROT  --  13.1  --   --   --   INR  --  1.0  --   --   --   CREATININE 3.88*  --   --   --   --   TROPONINIHS  --   --  1,369* 2,780* 5,300*    Estimated Creatinine Clearance: 20.2 mL/min (A) (by C-G formula based on SCr of 3.88 mg/dL (H)).   Medical History: Past Medical History:  Diagnosis Date  . CAD (coronary artery disease)   . CKD (chronic kidney disease), stage III   . COPD (chronic obstructive pulmonary disease) ([place])   . COVID-19   . Diabetes mellitus without complication ([place])   . HFrEF (heart failure with reduced ejection fraction) ([place])   . LBBB (left bundle branch block)   . Venous ulcer [facility])     Assessment: 68 y.o. male with a hx of CAD s/p 4-vessel CABG with LIMA-LAD, SVG-diagonal, SVG to OM, SVG to RCA with non-STEMI in 07/2015 status post PCI/DES. Pharmacy asked to initiate and monitor Heparin for ACS.  No anticoagulants except aspirin and clopidogrel PTA per current med list. HS troponin rising; Baseline labs: INR 1.0, H&H: 9.1/28.3, PLT wnl  Heparin Course: 12/24 4000 unit bolus, then 1000 units/hr 12/24 1439 aPTT 52s, HL 0.12  Goal of Therapy:  Heparin level 0.3-0.7 units/ml Monitor platelets by anticoagulation protocol: Yes   Plan:   Heparin level subtherapeutic: Heparin 2500 unit bolus, then increase infusion to 1300 units/hr  Check HL 8 hours after changes have been made  CBC in am  Dallie Piles 06/21/2019,1:12 PM

## 2019-06-21 NOTE — Consult Note (Signed)
Cardiology Consultation:   Patient ID: Bryce Garcia; [id number]; 28-Nov-1950   Admit date: 06/21/2019 Date of Consult: 06/21/2019  Primary Care Provider: [place] Primary Cardiologist: [facility]   Patient Profile:   Bryce Garcia is a 68 y.o. male with a hx of CAD s/p 4-vessel CABG with LIMA-LAD, SVG-diagonal, SVG to OM, SVG to RCA with non-STEMI in 07/2015 status post PCI/DES to LCx and OM1 with recurrent 99% ISR of OM1 in 12/2015 status post PCI/DES, HFrEF secondary to ICM with prior EF of 40% improved to 50% by echo in 08/2018, recent COVID-19 infection approximately 4 weeks prior CKD stage III with a baseline serum creatinine of approximately 2.4, venous ulcer of the left leg, diabetes, LBBB, HTN, and COPD who is being seen today for the evaluation of elevated troponin at the request of Dr. Damita Dunnings.  History of Present Illness:   Bryce Garcia is followed by [facility] cardiology.  He underwent four-vessel CABG with subsequent PCI in 2017 as outlined above.  He is known to have ischemic cardiomyopathy with most recent echo from 08/2018 showing an improved LV systolic function with an EF of 50%.  He was most recently seen by [facility] cardiology in 08/2018 with some volume overload indicating his weight had increased from 182 pounds to 187 pounds.  In this setting, he was advised to take metolazone 2.5 mg nightly along with his regularly scheduled 80 mg of Lasix.  More recently, the patient was diagnosed with COVID-19 in 04/2019.  He did not require hospital admission.  He presented to [facility] with a 4-day history of worsening shortness of breath without chest pain or lower extremity swelling.  He did note some worsening abdominal distention.  He indicates he typically hold onto fluid in his abdomen.  He does drink large amounts of diet Mountain Dew at baseline and more recently transition to water.  He has takeout food approximately 2-3 times per week.  At baseline, he  sleeps in an recliner.  Upon his arrival, he was noted to be hypoxic at 68% on room air in the ED which improved to 94% on 4 L nasal cannula.  Temperature at that time was 99 F.  Chest x-ray showed multifocal airspace opacities with consolidation of the left midlung.  BNP was elevated at 1167.  High-sensitivity troponin elevated at 1369 with a delta of 2780.  EKG showed sinus rhythm with known LBBB.  Hemoglobin down to 9.1 with a baseline approximately 11-12.  Serum creatinine 3.88 with a baseline approximately 2.4 as outlined above.  Point-of-care COVID-19 negative.  Blood cultures no growth to date.  Hemoccult pending.  Documented weight in the ED of 195 pounds.  In ED, he was started on heparin drip as well as IV antibiotics for multifocal pneumonia.  He denies any chest pain.   Past Medical History:  Diagnosis Date  . CAD (coronary artery disease)   . CKD (chronic kidney disease), stage III   . COPD (chronic obstructive pulmonary disease) ([place])   . COVID-19   . Diabetes mellitus without complication ([place])   . HFrEF (heart failure with reduced ejection fraction) ([place])   . LBBB (left bundle branch block)   . Venous ulcer ([place])     History reviewed. No pertinent surgical history.   Home Meds: Prior to Admission medications   Medication Sig Start Date End Date Taking? Authorizing Provider  amLODipine (NORVASC) 10 MG tablet Take 10 mg by mouth daily. 04/09/19  Yes [provider]  aspirin 81 MG EC tablet Take by mouth. 12/08/06  Yes [provider]  atorvastatin (LIPITOR) 80 MG tablet Take 80 mg by mouth daily. 04/30/19  Yes [provider]  carvedilol (COREG) 25 MG tablet Take 25 mg by mouth 2 (two) times daily. 04/05/19  Yes [provider]  clopidogrel (PLAVIX) 75 MG tablet Take 75 mg by mouth daily. 04/30/19  Yes [provider]  DULoxetine (CYMBALTA) 30 MG capsule Take 30 mg by mouth daily. 05/22/19  Yes [provider]  EUTHYROX 100  MCG tablet Take 100 mcg by mouth daily. 03/18/19  Yes [provider]  furosemide (LASIX) 80 MG tablet Take 80 mg by mouth daily. 04/30/19  Yes [provider]  gabapentin (NEURONTIN) 300 MG capsule Take 300 mg by mouth 2 (two) times daily. 06/15/19  Yes [provider]  insulin NPH Human (NOVOLIN N) 100 UNIT/ML injection Inject 5 units sq qam and 19 units sq q hs, adjust as directed up to 30 units daily 04/17/18  Yes [provider]  insulin regular (NOVOLIN R) 100 units/mL injection Inject 5-20 Units into the skin 3 (three) times daily before meals. Depending on blood glucose level 04/17/18  Yes [provider]  pentoxifylline (TRENTAL) 400 MG CR tablet Take 400 mg by mouth 3 (three) times daily. 06/12/19  Yes [provider]  traMADol (ULTRAM) 50 MG tablet Take 50 mg by mouth every 6 (six) hours as needed. 05/17/19  Yes [provider]    Inpatient Medications: Scheduled Meds: . albuterol  2 puff Inhalation Q6H  . amLODipine  10 mg Oral Daily  . aspirin  81 mg Oral Daily  . atorvastatin  80 mg Oral Daily  . carvedilol  25 mg Oral BID  . dexamethasone (DECADRON) injection  6 mg Intravenous Q24H  . DULoxetine  30 mg Oral Daily  . furosemide  40 mg Intravenous BID  . insulin aspart  0-15 Units Subcutaneous TID WC  . levothyroxine  100 mcg Oral Daily  . pentoxifylline  400 mg Oral TID  . sodium chloride flush  3 mL Intravenous Q12H   Continuous Infusions: . sodium chloride    . azithromycin    . cefTRIAXone (ROCEPHIN)  IV    . heparin 1,000 Units/hr (06/21/19 0630)   PRN Meds: sodium chloride, sodium chloride flush, traMADol  Allergies:   Allergies  Allergen Reactions  . Other Other (See Comments)    Hypoglycemia unawareness Hypoglycemia unawareness     Social History:   Social History   Socioeconomic History  . Marital status: Widowed    Spouse name: Not on file  . Number of children: Not on file  . Years of  education: Not on file  . Highest education level: Not on file  Occupational History  . Not on file  Tobacco Use  . Smoking status: Never Smoker  . Smokeless tobacco: Never Used  Substance and Sexual Activity  . Alcohol use: Not Currently  . Drug use: Never  . Sexual activity: Not on file  Other Topics Concern  . Not on file  Social History Narrative  . Not on file   Social Determinants of Health   Financial Resource Strain:   . Difficulty of Paying Living Expenses: Not on file  Food Insecurity:   . Worried About Charity fundraiser in the Last Year: Not on file  . Ran Out of Food in the Last Year: Not on file  Transportation Needs:   . Film/video editor (Medical): Not on file  . Lack of Transportation (Non-Medical): Not on file  Physical Activity:   . Days of Exercise per Week: Not on file  . Minutes of Exercise per Session: Not on file  Stress:   . Feeling of Stress : Not on file  Social Connections:   . Frequency of Communication with Friends and Family: Not on file  . Frequency of Social Gatherings with Friends and Family: Not on file  . Attends Religious Services: Not on file  . Active Member of Clubs or Organizations: Not on file  . Attends Archivist Meetings: Not on file  . Marital Status: Not on file  Intimate Partner Violence:   . Fear of Current or Ex-Partner: Not on file  . Emotionally Abused: Not on file  . Physically Abused: Not on file  . Sexually Abused: Not on file     Family History:   Family History  Problem Relation Age of Onset  . Diabetes Mother   . Hypertension Mother   . Hyperlipidemia Mother   . Cancer Father     ROS:  Review of Systems  Constitutional: Positive for malaise/fatigue. Negative for chills, diaphoresis, fever and weight loss.  HENT: Negative for congestion.   Eyes: Negative for discharge and redness.  Respiratory: Positive for cough and shortness of breath. Negative for hemoptysis, sputum production and  wheezing.   Cardiovascular: Negative for chest pain, palpitations, orthopnea, claudication, leg swelling and PND.  Gastrointestinal: Negative for abdominal pain, blood in stool, heartburn, melena, nausea and vomiting.  Genitourinary: Negative for hematuria.  Musculoskeletal: Negative for falls and myalgias.  Skin: Negative for rash.  Neurological: Positive for weakness. Negative for dizziness, tingling, tremors, sensory change, speech change, focal weakness and loss of consciousness.  Endo/Heme/Allergies: Does not bruise/bleed easily.  Psychiatric/Behavioral: Negative for substance abuse. The patient is not nervous/anxious.   All other systems reviewed and are negative.     Physical Exam/Data:   Vitals:   06/21/19 0600 06/21/19 0630 06/21/19 0730 06/21/19 0745  BP: (!) 136/56 (!) 144/63 137/60   Pulse: 72 72 74 73  Resp: 17 15 14 15   Temp:      TempSrc:      SpO2: 92% 92% 95% 94%  Weight:      Height:        Intake/Output Summary (Last 24 hours) at 06/21/2019 0847 Last data filed at 06/21/2019 0351 Gross per 24 hour  Intake 390 ml  Output --  Net 390 ml   Filed Weights   06/21/19 0150  Weight: 88.5 kg   Body mass index is 29.65 kg/m.   Physical Exam: General: Well developed, well nourished, in no acute distress. Head: Normocephalic, atraumatic, sclera non-icteric, no xanthomas, nares without discharge.  Neck: Negative for carotid bruits. JVD not elevated. Lungs: Coarse breath sounds bilaterally with diminished breath sounds along the bilateral bases. Breathing is unlabored. Heart: RRR with S1 S2. No murmurs, rubs, or gallops appreciated. Abdomen: Soft, non-tender, mildly distended with normoactive bowel sounds. No hepatomegaly. No rebound/guarding. No obvious abdominal masses. Msk:  Strength and tone appear normal for age. Extremities: No clubbing or cyanosis. No edema. Distal pedal pulses are 2+ and equal bilaterally. Neuro: Alert and oriented X 3. No facial  asymmetry. No focal deficit. Moves all extremities spontaneously. Psych:  Responds to questions appropriately with a normal affect.   EKG:  The EKG was personally reviewed and demonstrates: NSR, 89 bpm, LBBB (known)  Telemetry:  Telemetry was personally reviewed and demonstrates: SR, 70s bpm, BBB  Weights: [employer]   06/21/19 0150  Weight: 88.5 kg    Relevant CV Studies:  2D Echo 08/2018 at [facility]:  Technically difficult study due to chest wall/lung interference  Borderline left ventricular systolic function, ejection fraction 50%  Mitral annular calcification  Dilated left atrium - mild  Aortic sclerosis  Normal right ventricular systolic function __________  [facility] 11/2016 at [facility]: Findings: 1. Coronary artery disease including an occluded RCA, an occluded LAD, and  OM 50-60% in-stent restenosis. 2. Patent LIMA to LAD (SVG to RCA, SVG to diagonal, and SVG to OM are  known to be occluded. 3. Elevated left ventricular filling pressures (LVEDP = 20 mm Hg).  Recommendations: 1. Aggressive secondary prevention. 2. Dual antiplatelet therapy with aspirin and clopidogrel for at least 12  months, ideally longer. 3. Follow up with primary cardiologist.  Laboratory Data:  Chemistry Recent Labs  Lab 06/21/19 0154  NA 139  K 4.8  CL 104  CO2 21*  GLUCOSE 233*  BUN 55*  CREATININE 3.88*  CALCIUM 8.7*  GFRNONAA 15*  GFRAA 18*  ANIONGAP 14    No results for input(s): PROT, ALBUMIN, AST, ALT, ALKPHOS, BILITOT in the last 168 hours. Hematology Recent Labs  Lab 06/21/19 0154  WBC 12.5*  RBC 3.13*  HGB 9.1*  HCT 28.3*  MCV 90.4  MCH 29.1  MCHC 32.2  RDW 14.0  PLT 349   Cardiac EnzymesNo results for input(s): TROPONINI in the last 168 hours. No results for input(s): TROPIPOC in the last 168 hours.  BNP Recent Labs  Lab 06/21/19 0155  BNP 1,167.0*    DDimer No results for input(s): DDIMER in the last 168 hours.  Radiology/Studies:  DG Chest Portable 1  View  Result Date: 06/21/2019 IMPRESSION: 1. Diffuse interstitial and multifocal areas of airspace opacity with more dense consolidative opacity in the left mid lung, compatible with multifocal pneumonia. 2. Small bilateral effusions. Electronically Signed   By: Lovena Le M.D.   On: 06/21/2019 02:47    Assessment and Plan:   1.  Acute hypoxic respiratory failure: -Likely in the setting of community-acquired multifocal pneumonia, COPD exacerbation, acute on chronic HFrEF and worsening anemia -Supplemental oxygen per primary service  2.  CAD s/p CABG s/p PCI as above with NSTEMI: -In the setting of known multivessel native coronary artery disease, acute hypoxic respiratory failure, multifocal pneumonia, acute on chronic CKD, and underlying anemia -Pending hemoglobin trend, it may be reasonable to continue IV heparin for a total of 48 to 72 hours -Check echo -Once patient has improved from his acute illness, acute on chronic kidney disease, and his anemia is further evaluated and treated he will require ischemic evaluation, preferably with cardiac cath though this is to be determined based on his anemia and renal disease  -If hemoglobin allows would continue PTA aspirin/Plavix  3.  ICM: -Patient has a reported dry weight of approximately 182 pounds with a documented weight in the ED of 195 pounds -He does note some abdominal distension, which is typically where he holds onto fluid -He historically drinks a lot of diet [facility], though more recently has transitioned to water -BNP is elevated as outlined above -He does have evidence of acute on chronic CKD though we cannot exclude cardiorenal/vascular congestion playing a role at this time -Renal ultrasound and echo to help gauge diuresis -In the setting of his acute on chronic CKD would also recommend  holding PTA enalapril  4.  Multifocal pneumonia/recent COVID-19 infection/COPD exacerbation: -Antibiotics, nebs, incentive spirometer,  and supportive care per primary service -Cultures pending  5.  Anemia: -Hemoccult pending -Monitor with heparin drip -Per primary service  6.  Acute on CKD stage III: -Cannot exclude ATN in the setting of underlying infection versus cardiorenal syndrome versus vascular congestion with significant abdominal distention on exam as patient's weight is up approximately 13 pounds from baseline -Hold Lasix and enalapril for now -Renal ultrasound pending -Internal medicine has consulted nephrology  7.  HLD: -Continue PTA Lipitor -Check fasting lipid panel  For questions or updates, please contact [place] Please consult [email or web address] for contact info under Cardiology/STEMI.   Signed, Christell Faith, PA-C [place] Pager: [phone] 06/21/2019, 8:47 AM

## 2019-06-22 LAB — LIPID PANEL
Cholesterol: 167 mg/dL (ref 0–200)
HDL: 48 mg/dL (ref >40)
LDL Cholesterol: 104 mg/dL — ABNORMAL HIGH (ref 0–99)
Total CHOL/HDL Ratio: 3.5 RATIO
Triglycerides: 77 mg/dL (ref <150)
VLDL: 15 mg/dL (ref 0–40)

## 2019-06-22 LAB — GLUCOSE, CAPILLARY
Glucose-Capillary: 439 mg/dL — ABNORMAL HIGH (ref 70–99)
Glucose-Capillary: 338 mg/dL — ABNORMAL HIGH (ref 70–99)
Glucose-Capillary: 443 mg/dL — ABNORMAL HIGH (ref 70–99)
Glucose-Capillary: 418 mg/dL — ABNORMAL HIGH (ref 70–99)
Glucose-Capillary: 341 mg/dL — ABNORMAL HIGH (ref 70–99)
Glucose-Capillary: 321 mg/dL — ABNORMAL HIGH (ref 70–99)
Glucose-Capillary: 250 mg/dL — ABNORMAL HIGH (ref 70–99)

## 2019-06-22 LAB — CBC
HCT: 26.4 % — ABNORMAL LOW (ref 39.0–52.0)
Hemoglobin: 8.6 g/dL — ABNORMAL LOW (ref 13.0–17.0)
MCH: 29.4 pg (ref 26.0–34.0)
MCHC: 32.6 g/dL (ref 30.0–36.0)
MCV: 90.1 fL (ref 80.0–100.0)
Platelets: 350 10*3/uL (ref 150–400)
RBC: 2.93 MIL/uL — ABNORMAL LOW (ref 4.22–5.81)
RDW: 13.6 % (ref 11.5–15.5)
WBC: 13.3 10*3/uL — ABNORMAL HIGH (ref 4.0–10.5)
nRBC: 0 % (ref 0.0–0.2)

## 2019-06-22 LAB — HEPARIN LEVEL (UNFRACTIONATED)
Heparin Unfractionated: 0.17 IU/mL — ABNORMAL LOW (ref 0.30–0.70)
Heparin Unfractionated: 0.32 IU/mL (ref 0.30–0.70)
Heparin Unfractionated: 0.49 IU/mL (ref 0.30–0.70)

## 2019-06-22 LAB — BASIC METABOLIC PANEL
Anion gap: 10 (ref 5–15)
BUN: 82 mg/dL — ABNORMAL HIGH (ref 8–23)
CO2: 22 mmol/L (ref 22–32)
Calcium: 8.7 mg/dL — ABNORMAL LOW (ref 8.9–10.3)
Chloride: 100 mmol/L (ref 98–111)
Creatinine, Ser: 4.53 mg/dL — ABNORMAL HIGH (ref 0.61–1.24)
GFR calc Af Amer: 15 mL/min — ABNORMAL LOW (ref >60)
GFR calc non Af Amer: 13 mL/min — ABNORMAL LOW (ref >60)
Glucose, Bld: 417 mg/dL — ABNORMAL HIGH (ref 70–99)
Potassium: 4.9 mmol/L (ref 3.5–5.1)
Sodium: 132 mmol/L — ABNORMAL LOW (ref 135–145)

## 2019-06-22 MED ORDER — INSULIN ASPART 100 UNIT/ML ~~LOC~~ SOLN
10.0000 [IU] | Freq: Three times a day (TID) | SUBCUTANEOUS | Status: DC
  Administered 2019-06-22: 10 [IU] via SUBCUTANEOUS
  Filled 2019-06-22: qty 1

## 2019-06-22 MED ORDER — HEPARIN BOLUS VIA INFUSION
1200.0000 [IU] | Freq: Once | INTRAVENOUS | Status: AC
  Administered 2019-06-22: 1200 [IU] via INTRAVENOUS
  Filled 2019-06-22: qty 1200

## 2019-06-22 MED ORDER — MORPHINE SULFATE (PF) 2 MG/ML IV SOLN
2.0000 mg | Freq: Once | INTRAVENOUS | Status: AC
  Administered 2019-06-22: 2 mg via INTRAVENOUS
  Filled 2019-06-22: qty 1

## 2019-06-22 MED ORDER — INSULIN ASPART 100 UNIT/ML ~~LOC~~ SOLN
15.0000 [IU] | Freq: Once | SUBCUTANEOUS | Status: AC
  Administered 2019-06-22: 15 [IU] via SUBCUTANEOUS
  Filled 2019-06-22: qty 1

## 2019-06-22 MED ORDER — INSULIN DETEMIR 100 UNIT/ML ~~LOC~~ SOLN
25.0000 [IU] | Freq: Every day | SUBCUTANEOUS | Status: DC
  Administered 2019-06-22 – 2019-06-24 (×3): 25 [IU] via SUBCUTANEOUS
  Administered 2019-06-25: 16 [IU] via SUBCUTANEOUS
  Administered 2019-06-26: 25 [IU] via SUBCUTANEOUS
  Filled 2019-06-22 (×7): qty 0.25

## 2019-06-22 MED ORDER — AZITHROMYCIN 250 MG PO TABS
500.0000 mg | ORAL_TABLET | Freq: Every day | ORAL | Status: AC
  Administered 2019-06-22 – 2019-06-24 (×3): 500 mg via ORAL
  Filled 2019-06-22 (×3): qty 2

## 2019-06-22 MED ORDER — INSULIN ASPART 100 UNIT/ML ~~LOC~~ SOLN
10.0000 [IU] | Freq: Once | SUBCUTANEOUS | Status: AC
  Administered 2019-06-22: 10 [IU] via SUBCUTANEOUS
  Filled 2019-06-22: qty 1

## 2019-06-22 MED ORDER — INSULIN ASPART 100 UNIT/ML ~~LOC~~ SOLN
5.0000 [IU] | Freq: Three times a day (TID) | SUBCUTANEOUS | Status: DC
  Administered 2019-06-22 (×2): 5 [IU] via SUBCUTANEOUS
  Filled 2019-06-22 (×2): qty 1

## 2019-06-22 NOTE — Progress Notes (Signed)
ANTICOAGULATION CONSULT NOTE  Pharmacy Consult for Heparin Indication: chest pain/ACS  Patient Measurements: Height: 5\' 8"  (172.7 cm) Weight: 195 lb (88.5 kg) IBW/kg (Calculated) : 68.4 HEPARIN DW (KG): 86.4  Vital Signs:    Labs: Recent Labs    06/21/19 0154 06/21/19 0155 06/21/19 0156 06/21/19 0412 06/21/19 0433 06/21/19 1439 06/22/19 0016 06/22/19 0506 06/22/19 0752 06/22/19 1910  HGB 9.1*  --   --   --   --   --   --  8.6*  --   --   HCT 28.3*  --   --   --   --   --   --  26.4*  --   --   PLT 349  --   --   --   --   --   --  350  --   --   APTT  --   --   --   --   --  52*  --   --   --   --   LABPROT  --  13.1  --   --   --   --   --   --   --   --   INR  --  1.0  --   --   --   --   --   --   --   --   HEPARINUNFRC  --   --    < >  --   --  0.12* 0.32  --  0.17* 0.49  CREATININE 3.88*  --   --   --   --   --   --  4.53*  --   --   TROPONINIHS  --   --   --  2,780* 5,300* 8,795*  --   --   --   --    < > = values in this interval not displayed.    Estimated Creatinine Clearance: 17.3 mL/min (A) (by C-G formula based on SCr of 4.53 mg/dL (H)).   Medical History: Past Medical History:  Diagnosis Date  . CAD (coronary artery disease)   . CKD (chronic kidney disease), stage III   . COPD (chronic obstructive pulmonary disease) ([place])   . COVID-19   . Diabetes mellitus without complication ([place])   . HFrEF (heart failure with reduced ejection fraction) ([place])   . LBBB (left bundle branch block)   . Venous ulcer [facility])     Assessment: 68 y.o. male with a hx of CAD s/p 4-vessel CABG with LIMA-LAD, SVG-diagonal, SVG to OM, SVG to RCA with non-STEMI in 07/2015 status post PCI/DES. Pharmacy asked to initiate and monitor Heparin for ACS.  No anticoagulants except aspirin and clopidogrel PTA per current med list. HS troponin rising; Baseline labs: INR 1.0, H&H: 9.1/28.3, PLT wnl  Heparin Course: 12/24 4000 unit bolus, then 1000 units/hr 12/24 1439 aPTT 52s, HL  0.12 12/25 0016 HL 0.32, therapeutic x 1 12/25 0752 HL 0.17, subtherapeutic. Will bolus 1200 units and increase rate to 1450 units/hr.  12/25 1910 HL 0.49, therapeutic  Goal of Therapy:  Heparin level 0.3-0.7 units/ml Monitor platelets by anticoagulation protocol: Yes   Plan:  Heparin level therapeutic:  Will continue current rate of 1450 units/hr. Check HL in 8 hours. Daily CBC. Hgb trending down, pt with anemia with CKD.   Pearla Dubonnet, PharmD 06/22/2019,7:42 PM

## 2019-06-22 NOTE — Progress Notes (Addendum)
PROGRESS NOTE    Bryce Garcia  [id number] DOB: Nov 19, 1950 DOA: 06/21/2019 PCP: Reid Hope King    Brief Narrative:  Bryce Garcia a 68 y.o.malewith medical history significanttype 1 diabetes, CAD status post CABG, systolic heart failure, last EF 50%, CKD 4, type 1 diabetes, hypertension, who had COVID-19 infection over 4 weeks ago from which he states he had completely resolved, who presents with a 4-day history of progressively worsening shortness of breath. Denies chest pain and denies lower extremity pain or swelling. He denies having a cough or fever. Denies nausea vomiting or diaphoresis, denies abdominal pain or change in bowel habits. On arrival in the emergency room his O2 sat was 68% on room air, improving to 94% on 4 L. Temperature was 99 with blood pressure 157/62 heart rate 83 and respirations 18/min. His chest x-ray showed multifocal airspace opacities with consolidation of the left midlung. On his blood work BNP was elevated at 1167. EKG showed no acute ST-T wave changes. Other blood work remarkable for creatinine of 3.88 up from his baseline of 2.87, blood sugar of 233, hemoglobin of 9.1, down from baseline of 11.7 and white cell count of 12.5. Baseline labs were obtained from review of care everywhere from [facility] where patient gets most of his care. RepeatCovid test in the ER was negative. She was started on Rocephin and azithromycin and hospitalization requested. Status discussed and patient elects to be DNR,was confirmed with son on phone.    Consultants:   Cardiology, nephrology  Procedures:  Echo 12/24 1. Left ventricular ejection fraction, by visual estimation, is 50 to 55%. The left ventricle has normal function. There is no left ventricular hypertrophy.  2. Left ventricular diastolic parameters are consistent with Grade II diastolic dysfunction (pseudonormalization).  3. The left ventricle has no regional  wall motion abnormalities.  4. Global right ventricle has normal systolic function.The right ventricular size is normal. No increase in right ventricular wall thickness.  5. Left atrial size was mildly dilated.  6. Moderate mitral valve regurgitation.  7. TR signal is inadequate for assessing pulmonary artery systolic pressure.  Antimicrobials:   Azithromycin and ceftriaxone    Subjective: Still on oxygen, reports feeling better .  Shortness of breath improving no chest pain.  Fingersticks have been elevated Objective: Vitals:   06/21/19 1300 06/21/19 1401 06/21/19 2035 06/22/19 0606  BP: 139/65 (!) 139/99 (!) 147/76 (!) 145/71  Pulse: 72 77 88 69  Resp: 19 18 20 20   Temp:  98 F (36.7 C) 97.8 F (36.6 C) 97.7 F (36.5 C)  TempSrc:  Oral Oral Oral  SpO2: 95% 98% 99% 95%  Weight:      Height:        Intake/Output Summary (Last 24 hours) at 06/22/2019 1157 Last data filed at 06/22/2019 0132 Gross per 24 hour  Intake 1063.26 ml  Output 400 ml  Net 663.26 ml   Filed Weights   06/21/19 0150  Weight: 88.5 kg    Examination: General exam: Appears calm and comfortable, NAD, eating breakfast, nontachypneic Respiratory system: Clear to auscultation. Respiratory effort normal.  No rales rhonchi's or wheezing Cardiovascular system: S1 & S2 heard, RRR.  No murmurs, rubs, gallops or clicks. Gastrointestinal system: Abdomen is nondistended, soft and nontender. Normal bowel sounds heard. Central nervous system: Alert and oriented. No focal neurological deficits. Extremities: bilateral lower extremity edema up to lower thighs, with chronic skin changes Skin: Warm dry Psychiatry: Judgement and insight appear normal.  Mood & affect appropriate.      Data Reviewed: I have personally reviewed following labs and imaging studies  CBC: Recent Labs  Lab 06/21/19 0154 06/22/19 0506  WBC 12.5* 13.3*  NEUTROABS 10.2*  --   HGB 9.1* 8.6*  HCT 28.3* 26.4*  MCV 90.4 90.1  PLT 349  [id number]   Basic Metabolic Panel: Recent Labs  Lab 06/21/19 0154 06/22/19 0506  NA 139 132*  K 4.8 4.9  CL 104 100  CO2 21* 22  GLUCOSE 233* 417*  BUN 55* 82*  CREATININE 3.88* 4.53*  CALCIUM 8.7* 8.7*   GFR: Estimated Creatinine Clearance: 17.3 mL/min (A) (by C-G formula based on SCr of 4.53 mg/dL (H)). Liver Function Tests: No results for input(s): AST, ALT, ALKPHOS, BILITOT, PROT, ALBUMIN in the last 168 hours. No results for input(s): LIPASE, AMYLASE in the last 168 hours. No results for input(s): AMMONIA in the last 168 hours. Coagulation Profile: Recent Labs  Lab 06/21/19 0155  INR 1.0   Cardiac Enzymes: No results for input(s): CKTOTAL, CKMB, CKMBINDEX, TROPONINI in the last 168 hours. BNP (last 3 results) No results for input(s): PROBNP in the last 8760 hours. HbA1C: Recent Labs    06/21/19 0412  HGBA1C 6.8*   CBG: Recent Labs  Lab 06/21/19 1808 06/21/19 2034 06/22/19 0148 06/22/19 0732 06/22/19 1140  GLUCAP 418* 477* 439* 338* 443*   Lipid Profile: Recent Labs    06/22/19 0506  CHOL 167  HDL 48  LDLCALC 104*  TRIG 77  CHOLHDL 3.5   Thyroid Function Tests: No results for input(s): TSH, T4TOTAL, FREET4, T3FREE, THYROIDAB in the last 72 hours. Anemia Panel: No results for input(s): VITAMINB12, FOLATE, FERRITIN, TIBC, IRON, RETICCTPCT in the last 72 hours. Sepsis Labs: Recent Labs  Lab 06/21/19 0155 06/21/19 0412 06/21/19 0433  PROCALCITON  --   --  0.30  LATICACIDVEN 1.4 0.8  --     Recent Results (from the past 240 hour(s))  Blood Culture (routine x 2)     Status: None (Preliminary result)   Collection Time: 06/21/19  2:08 AM   Specimen: BLOOD  Result Value Ref Range Status   Specimen Description BLOOD BLOOD RIGHT FOREARM  Final   Special Requests   Final    BOTTLES DRAWN AEROBIC AND ANAEROBIC Blood Culture adequate volume   Culture   Final    NO GROWTH 1 DAY Performed at [facility], [address], [place], [place]  [address]    Report Status PENDING  Incomplete  Blood Culture (routine x 2)     Status: None (Preliminary result)   Collection Time: 06/21/19  2:13 AM   Specimen: BLOOD  Result Value Ref Range Status   Specimen Description BLOOD LEFT ANTECUBITAL  Final   Special Requests   Final    BOTTLES DRAWN AEROBIC AND ANAEROBIC Blood Culture results may not be optimal due to an inadequate volume of blood received in culture bottles   Culture   Final    NO GROWTH 1 DAY Performed at [facility], [address], [place], [place] [address]    Report Status PENDING  Incomplete  Respiratory Panel by PCR     Status: None   Collection Time: 06/21/19 11:56 AM   Specimen: Nasopharyngeal Swab; Respiratory  Result Value Ref Range Status   Adenovirus NOT DETECTED NOT DETECTED Final   Coronavirus 229E NOT DETECTED NOT DETECTED Final    Comment: (NOTE) The Coronavirus on the Respiratory Panel, DOES NOT test  for the novel  Coronavirus (2019 nCoV)    Coronavirus HKU1 NOT DETECTED NOT DETECTED Final   Coronavirus NL63 NOT DETECTED NOT DETECTED Final   Coronavirus OC43 NOT DETECTED NOT DETECTED Final   Metapneumovirus NOT DETECTED NOT DETECTED Final   Rhinovirus / Enterovirus NOT DETECTED NOT DETECTED Final   Influenza A NOT DETECTED NOT DETECTED Final   Influenza B NOT DETECTED NOT DETECTED Final   Parainfluenza Virus 1 NOT DETECTED NOT DETECTED Final   Parainfluenza Virus 2 NOT DETECTED NOT DETECTED Final   Parainfluenza Virus 3 NOT DETECTED NOT DETECTED Final   Parainfluenza Virus 4 NOT DETECTED NOT DETECTED Final   Respiratory Syncytial Virus NOT DETECTED NOT DETECTED Final   Bordetella pertussis NOT DETECTED NOT DETECTED Final   Chlamydophila pneumoniae NOT DETECTED NOT DETECTED Final   Mycoplasma pneumoniae NOT DETECTED NOT DETECTED Final    Comment: Performed at [facility], [place] [address] [place], [place] [address]         Radiology Studies: US RENAL  Result Date:  2019-06-24 CLINICAL DATA:  Acute kidney injury. Stage 4 chronic kidney disease. EXAM: RENAL / URINARY TRACT ULTRASOUND COMPLETE COMPARISON:  Chest CTA dated 10/27/2013. FINDINGS: Right Kidney: Renal measurements: 9.5 x 4.6 x 4.6 cm = volume: 106 mL. Mildly echogenic. No hydronephrosis. Diffuse cortical thinning with prominent renal sinus fat. Left Kidney: Renal measurements: 8.7 x 4.6 x 4.5 cm = volume: 94 mL. Borderline echogenic. No hydronephrosis. Bladder: Appears normal for degree of bladder distention. Other: None. IMPRESSION: 1. Mildly echogenic right kidney and borderline echogenic left kidney, compatible with medical renal disease. 2. No hydronephrosis. 3. Bilateral renal atrophy, greater on the right. Electronically Signed   By: Claudie Revering M.D.   On: 2019/06/24 11:56   DG Chest Portable 1 View  Result Date: Jun 24, 2019 CLINICAL DATA:  COVID-19 diagnosed 1 month prior, abnormal breathing since EXAM: PORTABLE CHEST 1 VIEW COMPARISON:  Chest radiograph 01/07/2014 FINDINGS: Diffuse interstitial and multifocal areas of airspace opacity with more dense consolidative opacity in the left mid lung. No pneumothorax. Small bilateral effusions. Prominence of the cardiac silhouette likely related to low volumes and portable technique. Postsurgical changes related to prior CABG including intact and aligned sternotomy wires and multiple surgical clips projecting over the mediastinum. No acute osseous or soft tissue abnormality. Telemetry leads overlie the chest. IMPRESSION: 1. Diffuse interstitial and multifocal areas of airspace opacity with more dense consolidative opacity in the left mid lung, compatible with multifocal pneumonia. 2. Small bilateral effusions. Electronically Signed   By: Lovena Le M.D.   On: 24-Jun-2019 02:47   ECHOCARDIOGRAM COMPLETE  Result Date: 06-24-19   ECHOCARDIOGRAM REPORT   Patient Name:   DANDRA DASHNAW Date of Exam: Jun 24, 2019 Medical Rec #:  [id number]      Height:       68.0  in Accession #:    [id number]     Weight:       195.0 lb Date of Birth:  1950-12-29      BSA:          2.02 m Patient Age:    33 years       BP:           139/65 mmHg Patient Gender: M              HR:           72 bpm. Exam Location:  [facility] Procedure: 2D Echo, Cardiac Doppler and Color Doppler Indications:  NSTEMI I21.4  History:         Patient has no prior history of Echocardiogram examinations.                  CAD; Arrythmias:LBBB.  Sonographer:     Alyse Low Roar Referring Phys:  [id number] Athena Masse Diagnosing Phys: Ida Rogue MD IMPRESSIONS  1. Left ventricular ejection fraction, by visual estimation, is 50 to 55%. The left ventricle has normal function. There is no left ventricular hypertrophy.  2. Left ventricular diastolic parameters are consistent with Grade II diastolic dysfunction (pseudonormalization).  3. The left ventricle has no regional wall motion abnormalities.  4. Global right ventricle has normal systolic function.The right ventricular size is normal. No increase in right ventricular wall thickness.  5. Left atrial size was mildly dilated.  6. Moderate mitral valve regurgitation.  7. TR signal is inadequate for assessing pulmonary artery systolic pressure. FINDINGS  Left Ventricle: Left ventricular ejection fraction, by visual estimation, is 50 to 55%. The left ventricle has normal function. The left ventricle has no regional wall motion abnormalities. There is no left ventricular hypertrophy. Left ventricular diastolic parameters are consistent with Grade II diastolic dysfunction (pseudonormalization). Normal left atrial pressure. Right Ventricle: The right ventricular size is normal. No increase in right ventricular wall thickness. Global RV systolic function is has normal systolic function. Left Atrium: Left atrial size was mildly dilated. Right Atrium: Right atrial size was normal in size Pericardium: There is no evidence of pericardial effusion. Mitral Valve: The mitral valve is  normal in structure. Moderate mitral valve regurgitation. No evidence of mitral valve stenosis by observation. Tricuspid Valve: The tricuspid valve is normal in structure. Tricuspid valve regurgitation is not demonstrated. Aortic Valve: The aortic valve is normal in structure. Aortic valve regurgitation is not visualized. Mild to moderate aortic valve sclerosis/calcification is present, without any evidence of aortic stenosis. Aortic valve mean gradient measures 3.0 mmHg. Aortic valve peak gradient measures 5.9 mmHg. Aortic valve area, by VTI measures 2.43 cm. Pulmonic Valve: The pulmonic valve was normal in structure. Pulmonic valve regurgitation is not visualized. Pulmonic regurgitation is not visualized. Aorta: The aortic root, ascending aorta and aortic arch are all structurally normal, with no evidence of dilitation or obstruction. Venous: The inferior vena cava is dilated in size with greater than 50% respiratory variability, suggesting right atrial pressure of 8 mmHg. IAS/Shunts: No atrial level shunt detected by color flow Doppler. There is no evidence of a patent foramen ovale. No ventricular septal defect is seen or detected. There is no evidence of an atrial septal defect.  LEFT VENTRICLE PLAX 2D LVIDd:         4.72 cm  Diastology LVIDs:         3.81 cm  LV e' lateral:   10.40 cm/s LV PW:         1.15 cm  LV E/e' lateral: 10.8 LV IVS:        1.02 cm  LV e' medial:    4.82 cm/s LVOT diam:     2.00 cm  LV E/e' medial:  23.2 LV SV:         41 ml LV SV Index:   19.70 LVOT Area:     3.14 cm  RIGHT VENTRICLE RV Basal diam:  2.89 cm LEFT ATRIUM            Index       RIGHT ATRIUM           Index  LA diam:      4.50 cm  2.23 cm/m  RA Area:     16.60 cm LA Vol (A2C): 77.8 ml  38.48 ml/m RA Volume:   45.40 ml  22.45 ml/m LA Vol (A4C): 106.0 ml 52.43 ml/m  AORTIC VALVE                   PULMONIC VALVE AV Area (Vmax):    2.08 cm    PV Vmax:        0.98 m/s AV Area (Vmean):   2.49 cm    PV Peak grad:   3.8  mmHg AV Area (VTI):     2.43 cm    RVOT Peak grad: 1 mmHg AV Vmax:           121.00 cm/s AV Vmean:          76.100 cm/s AV VTI:            0.226 m AV Peak Grad:      5.9 mmHg AV Mean Grad:      3.0 mmHg LVOT Vmax:         80.00 cm/s LVOT Vmean:        60.300 cm/s LVOT VTI:          0.175 m LVOT/AV VTI ratio: 0.77  AORTA Ao Root diam: 2.90 cm MITRAL VALVE MV Area (PHT): 3.60 cm              SHUNTS MV PHT:        61.19 msec            Systemic VTI:  0.18 m MV Decel Time: 211 msec              Systemic Diam: 2.00 cm MV E velocity: 112.00 cm/s 103 cm/s MV A velocity: 72.20 cm/s  70.3 cm/s MV E/A ratio:  1.55        1.5  Ida Rogue MD Electronically signed by Ida Rogue MD Signature Date/Time: 06/21/2019/2:10:13 PM    Final         Scheduled Meds: . albuterol  2 puff Inhalation Q6H  . amLODipine  10 mg Oral Daily  . aspirin EC  81 mg Oral Daily  . atorvastatin  80 mg Oral Daily  . azithromycin  500 mg Oral Daily  . carvedilol  25 mg Oral BID  . DULoxetine  30 mg Oral Daily  . furosemide  80 mg Intravenous BID  . insulin aspart  0-20 Units Subcutaneous TID WC  . insulin aspart  0-5 Units Subcutaneous QHS  . insulin aspart  15 Units Subcutaneous Once  . insulin aspart  5 Units Subcutaneous TID WC  . insulin detemir  25 Units Subcutaneous QHS  . levothyroxine  100 mcg Oral Daily  . pentoxifylline  400 mg Oral TID   Continuous Infusions: . cefTRIAXone (ROCEPHIN)  IV Stopped (06/21/19 2121)  . heparin 1,450 Units/hr (06/22/19 0947)    Assessment & Plan:   Principal Problem:   Acute respiratory failure with hypoxia ([place]) Active Problems:   CAP (community acquired pneumonia)   Venous ulcer of left leg (HCC)   COPD with chronic bronchitis (HCC)   CKD (chronic kidney disease) stage 4, GFR 15-29 ml/min (HCC)   AKI (acute kidney injury) ([place])   Chronic systolic CHF (congestive heart failure) ([place])   History of 2019 novel coronavirus disease (COVID-19)   Anemia   Acute respiratory  failure (HCC)   Hyperglycemia due to type  1 diabetes mellitus ([place])   Hx of CABG   NSTEMI (non-ST elevated myocardial infarction) ([place])   NSTEMI --In setting of acute respiratory distress and pneumonia  elevated troponin Echo with EF of 50 to 55%, grossly no wall motion abnormality Cardiology following -recommend continuing heparin until tomorrow to finish 48 hours.   Hopefully can do cardiac catheterization next week if renal functioning improves. will need ischemic work-up eventually Continue with beta-blockers and aspirin, statin   Acute respiratory failure with hypoxia (HCC) --Most likely related to community-acquired pneumonia and CHF exacerbation. --O2 sat was 68% on room air on arrival improving to 94% on 4 L patient has no prior history of home oxygen use --Supplemental oxygen to keep sats over 90% Stopped Decadron as he does not require at this time Continue continue IV antibiotics Continue IV lasix Try to wean off 02 if able to.  Community acquired pneumonia in the setting of recent history of COVID-19 infection about 4 weeks ago-- --Patient has no prior history of supplemental oxygen use ---Chest x-ray showing multifocal pneumonia with consolidative opacity in the left midlung --Repeat Covid test from emergency roomdated.respiratory panel pending --IV Rocephin and azithromycin for 5 to 7 days --Albuterol every 6 hours and as needed --Supplemental oxygen to keep sats over 90% --Blood cultures pending.  -respiratory panel negative  Acute onChronic Diastolic and systolic CHF (congestive heart failure) ([place]) --Patient [facility] at [facility]Last EF on file was 50%on 3/2020improved from 40% Echo here EF 50-55% with Grade II diastolic dysfunction. -- Chest x-ray shows small bilateral pleural effusion --Appears volume overloaded, on Lasix IV, will continue. Will discuss with nephrology as renal failure worsened. Likely renal Daily weights with salt and fluid  restriction   Anemia,uncertain acuity --Hemoglobin 9.1. Most hemoglobin on file on chart everywhere was 11.7 --Suspect related to worsening kidney function --Stool for occult blood to evaluate for GI etiology-pending --Monitor H&H  Hyperglycemia due to type 1 diabetes mellitus (HCC) Decadron stopped Increase Lantus to 25Units . Added novolog 5U with meals, increase as tolerated Changed diet to carb control with low sodium HA1C 6.8   Venous ulcer of left leg (HCC) --Recently seen by PCP on 06/19/2019 --Daily wound care  COPD with chronic bronchitis ([place]) --Does not appear to be acutely exacerbated --Albuterol prn   Acute kidney injury superimposed onCKD (chronic kidney disease) stage 4, GFR 15-29 ml/min (HCC) --BUN creatinine of55/3.88,most recent creatinine of 2.85 in June 2020 per [facility] worsening --Nephrology following-recommend low-salt diet and IV Lasix , will continue and f/u with any further recommendation  History ofCABG --Patient has history of CABG in 2005 --Complaints at chest pain at this time --For review and initiation of home meds when available     DVT prophylaxis Heparin Code Status: DNR Family Communication: None at bedside Disposition Plan: We will need PT OT. Will likely be here 2 MN days as he is not medically stable.     LOS: 1 day   Time spent: 45 minutes with more than 50% COC  ADDENDUM: Pt concerned with elevated BG, explained to him his BG elevated due to receiving steroids and infection. He wants me to increase his insulin, even though I explained I did earlier. I will increase his meal novolog to 10 Units from 5U. Monitor bg closely  Nolberto Hanlon, MD [facility] Pager [phone]  If 7PM-7AM, please contact night-coverage [email or web address] Password [facility] 06/22/2019, 11:57 AM

## 2019-06-22 NOTE — Progress Notes (Signed)
ANTICOAGULATION CONSULT NOTE  Pharmacy Consult for Heparin Indication: chest pain/ACS  Patient Measurements: Height: 5\' 8"  (172.7 cm) Weight: 195 lb (88.5 kg) IBW/kg (Calculated) : 68.4 HEPARIN DW (KG): 86.4  Vital Signs: Temp: 97.8 F (36.6 C) (12/24 2035) Temp Source: Oral (12/24 2035) BP: 147/76 (12/24 2035) Pulse Rate: 88 (12/24 2035)  Labs: Recent Labs    06/21/19 0154 06/21/19 0155 06/21/19 0412 06/21/19 0433 06/21/19 1439 06/22/19 0016  HGB 9.1*  --   --   --   --   --   HCT 28.3*  --   --   --   --   --   PLT 349  --   --   --   --   --   APTT  --   --   --   --  52*  --   LABPROT  --  13.1  --   --   --   --   INR  --  1.0  --   --   --   --   HEPARINUNFRC  --   --   --   --  0.12* 0.32  CREATININE 3.88*  --   --   --   --   --   TROPONINIHS  --   --  2,780* 5,300* 8,795*  --     Estimated Creatinine Clearance: 20.2 mL/min (A) (by C-G formula based on SCr of 3.88 mg/dL (H)).   Medical History: Past Medical History:  Diagnosis Date  . CAD (coronary artery disease)   . CKD (chronic kidney disease), stage III   . COPD (chronic obstructive pulmonary disease) ([place])   . COVID-19   . Diabetes mellitus without complication ([place])   . HFrEF (heart failure with reduced ejection fraction) ([place])   . LBBB (left bundle branch block)   . Venous ulcer [facility])     Assessment: 68 y.o. male with a hx of CAD s/p 4-vessel CABG with LIMA-LAD, SVG-diagonal, SVG to OM, SVG to RCA with non-STEMI in 07/2015 status post PCI/DES. Pharmacy asked to initiate and monitor Heparin for ACS.  No anticoagulants except aspirin and clopidogrel PTA per current med list. HS troponin rising; Baseline labs: INR 1.0, H&H: 9.1/28.3, PLT wnl  Heparin Course: 12/24 4000 unit bolus, then 1000 units/hr 12/24 1439 aPTT 52s, HL 0.12 12/25 0016 HL 0.32, therapeutic x 1  Goal of Therapy:  Heparin level 0.3-0.7 units/ml Monitor platelets by anticoagulation protocol: Yes   Plan:   Heparin level  therapeutic: Continue Heparin infusion at 1300 units/hr  Check HL in 8 hours to confirm   CBC in am  Hart Robinsons A 06/22/2019,1:14 AM

## 2019-06-22 NOTE — Progress Notes (Signed)
ANTICOAGULATION CONSULT NOTE  Pharmacy Consult for Heparin Indication: chest pain/ACS  Patient Measurements: Height: 5\' 8"  (172.7 cm) Weight: 195 lb (88.5 kg) IBW/kg (Calculated) : 68.4 HEPARIN DW (KG): 86.4  Vital Signs: Temp: 97.7 F (36.5 C) (12/25 0606) Temp Source: Oral (12/25 0606) BP: 145/71 (12/25 0606) Pulse Rate: 69 (12/25 0606)  Labs: Recent Labs    06/21/19 0154 06/21/19 0155 06/21/19 [id number] 06/21/19 0433 06/21/19 1439 06/22/19 0016 06/22/19 0506 06/22/19 0752  HGB 9.1*  --   --   --   --   --  8.6*  --   HCT 28.3*  --   --   --   --   --  26.4*  --   PLT 349  --   --   --   --   --  350  --   APTT  --   --   --   --  52*  --   --   --   LABPROT  --  13.1  --   --   --   --   --   --   INR  --  1.0  --   --   --   --   --   --   HEPARINUNFRC  --   --   --   --  0.12* 0.32  --  0.17*  CREATININE 3.88*  --   --   --   --   --  4.53*  --   TROPONINIHS  --   --  2,780* 5,300* 8,795*  --   --   --     Estimated Creatinine Clearance: 17.3 mL/min (A) (by C-G formula based on SCr of 4.53 mg/dL (H)).   Medical History: Past Medical History:  Diagnosis Date  . CAD (coronary artery disease)   . CKD (chronic kidney disease), stage III   . COPD (chronic obstructive pulmonary disease) ([place])   . COVID-19   . Diabetes mellitus without complication ([place])   . HFrEF (heart failure with reduced ejection fraction) ([place])   . LBBB (left bundle branch block)   . Venous ulcer [facility])     Assessment: 68 y.o. male with a hx of CAD s/p 4-vessel CABG with LIMA-LAD, SVG-diagonal, SVG to OM, SVG to RCA with non-STEMI in 07/2015 status post PCI/DES. Pharmacy asked to initiate and monitor Heparin for ACS.  No anticoagulants except aspirin and clopidogrel PTA per current med list. HS troponin rising; Baseline labs: INR 1.0, H&H: 9.1/28.3, PLT wnl  Heparin Course: 12/24 4000 unit bolus, then 1000 units/hr 12/24 1439 aPTT 52s, HL 0.12 12/25 0016 HL 0.32, therapeutic x 1 12/25 0752  HL 0.17, subtherapeutic. Will bolus 1200 units and increase rate to 1450 units/hr.   Goal of Therapy:  Heparin level 0.3-0.7 units/ml Monitor platelets by anticoagulation protocol: Yes   Plan:  Heparin level subtherapeutic:  Will bolus 1200 units and increase rate to 1450 units/hr. Check HL in 8 hours. Daily CBC. Hgb trending down, pt with anemia with CKD.   Oswald Hillock, PharmD, BCPS 06/22/2019,9:26 AM

## 2019-06-22 NOTE — Progress Notes (Signed)
PT Cancellation Note  Patient Details Name: Bryce Garcia MRN: [id number] DOB: Feb 21, 1951   Cancelled Treatment:    Reason Eval/Treat Not Completed: Medical issues which prohibited therapy(Patient consult received and reviewed. Patient not appropriate at this time for evaluation due to pending troponin reading and BG. Will attempt again at a later time/date as appropriate.)  Janna Arch, PT, DPT  06/22/2019, 1:01 PM

## 2019-06-22 NOTE — Progress Notes (Signed)
Patient's CBG 443. Alerted MD. Current scale limit is 400.   Fuller Mandril, RN

## 2019-06-22 NOTE — Progress Notes (Signed)
[facility]  ROUNDING NOTE   Subjective:   UOP 442mL.   Creatinine 4.53 (3.88)  Furosemide 80mg  IV q12.   Objective:  Vital signs in last 24 hours:  Temp:  [97.7 F (36.5 C)-98 F (36.7 C)] 97.7 F (36.5 C) (12/25 0606) Pulse Rate:  [69-88] 69 (12/25 0606) Resp:  [16-20] 20 (12/25 0606) BP: (118-147)/(60-99) 145/71 (12/25 0606) SpO2:  [92 %-99 %] 95 % (12/25 0606)  Weight change:  Filed Weights   06/21/19 0150  Weight: 88.5 kg    Intake/Output: I/O last 3 completed shifts: In: 1453.3 [P.O.:420; I.V.:293.3; IV Piggyback:740] Out: 400 [Urine:400]   Intake/Output this shift:  No intake/output data recorded.  Physical Exam: General: NAD,   Head: Normocephalic, atraumatic. Moist oral mucosal membranes  Eyes: Anicteric, PERRL  Neck: Supple, trachea midline  Lungs:  crackles  Heart: Regular rate and rhythm  Abdomen:  Soft, nontender,   Extremities:  + peripheral edema.  Neurologic: Nonfocal, moving all four extremities  Skin: No lesions        Basic Metabolic Panel: Recent Labs  Lab 06/21/19 0154 06/22/19 0506  NA 139 132*  K 4.8 4.9  CL 104 100  CO2 21* 22  GLUCOSE 233* 417*  BUN 55* 82*  CREATININE 3.88* 4.53*  CALCIUM 8.7* 8.7*    Liver Function Tests: No results for input(s): AST, ALT, ALKPHOS, BILITOT, PROT, ALBUMIN in the last 168 hours. No results for input(s): LIPASE, AMYLASE in the last 168 hours. No results for input(s): AMMONIA in the last 168 hours.  CBC: Recent Labs  Lab 06/21/19 0154 06/22/19 0506  WBC 12.5* 13.3*  NEUTROABS 10.2*  --   HGB 9.1* 8.6*  HCT 28.3* 26.4*  MCV 90.4 90.1  PLT 349 350    Cardiac Enzymes: No results for input(s): CKTOTAL, CKMB, CKMBINDEX, TROPONINI in the last 168 hours.  BNP: Invalid input(s): POCBNP  CBG: Recent Labs  Lab 06/21/19 1536 06/21/19 1808 06/21/19 2034 06/22/19 0148 06/22/19 0732  GLUCAP 384* 418* 477* 439* 338*    Microbiology: Results for orders placed or  performed during the hospital encounter of 06/21/19  Blood Culture (routine x 2)     Status: None (Preliminary result)   Collection Time: 06/21/19  2:08 AM   Specimen: BLOOD  Result Value Ref Range Status   Specimen Description BLOOD BLOOD RIGHT FOREARM  Final   Special Requests   Final    BOTTLES DRAWN AEROBIC AND ANAEROBIC Blood Culture adequate volume   Culture   Final    NO GROWTH 1 DAY Performed at [facility], [address], [place], [place] [address]    Report Status PENDING  Incomplete  Blood Culture (routine x 2)     Status: None (Preliminary result)   Collection Time: 06/21/19  2:13 AM   Specimen: BLOOD  Result Value Ref Range Status   Specimen Description BLOOD LEFT ANTECUBITAL  Final   Special Requests   Final    BOTTLES DRAWN AEROBIC AND ANAEROBIC Blood Culture results may not be optimal due to an inadequate volume of blood received in culture bottles   Culture   Final    NO GROWTH 1 DAY Performed at [facility], [address] [place], [place] [address]    Report Status PENDING  Incomplete  Respiratory Panel by PCR     Status: None   Collection Time: 06/21/19 11:56 AM   Specimen: Nasopharyngeal Swab; Respiratory  Result Value Ref Range Status   Adenovirus NOT  DETECTED NOT DETECTED Final   Coronavirus 229E NOT DETECTED NOT DETECTED Final    Comment: (NOTE) The Coronavirus on the Respiratory Panel, DOES NOT test for the novel  Coronavirus (2019 nCoV)    Coronavirus HKU1 NOT DETECTED NOT DETECTED Final   Coronavirus NL63 NOT DETECTED NOT DETECTED Final   Coronavirus OC43 NOT DETECTED NOT DETECTED Final   Metapneumovirus NOT DETECTED NOT DETECTED Final   Rhinovirus / Enterovirus NOT DETECTED NOT DETECTED Final   Influenza A NOT DETECTED NOT DETECTED Final   Influenza B NOT DETECTED NOT DETECTED Final   Parainfluenza Virus 1 NOT DETECTED NOT DETECTED Final   Parainfluenza Virus 2 NOT DETECTED NOT DETECTED Final   Parainfluenza Virus 3 NOT  DETECTED NOT DETECTED Final   Parainfluenza Virus 4 NOT DETECTED NOT DETECTED Final   Respiratory Syncytial Virus NOT DETECTED NOT DETECTED Final   Bordetella pertussis NOT DETECTED NOT DETECTED Final   Chlamydophila pneumoniae NOT DETECTED NOT DETECTED Final   Mycoplasma pneumoniae NOT DETECTED NOT DETECTED Final    Comment: Performed at [facility], [place] [address] [place], [place] [address]    Coagulation Studies: Recent Labs    06/21/19 0155  LABPROT 13.1  INR 1.0    Urinalysis: No results for input(s): COLORURINE, LABSPEC, PHURINE, GLUCOSEU, HGBUR, BILIRUBINUR, KETONESUR, PROTEINUR, UROBILINOGEN, NITRITE, LEUKOCYTESUR in the last 72 hours.  Invalid input(s): APPERANCEUR    Imaging: US RENAL  Result Date: 06/21/2019 CLINICAL DATA:  Acute kidney injury. Stage 4 chronic kidney disease. EXAM: RENAL / URINARY TRACT ULTRASOUND COMPLETE COMPARISON:  Chest CTA dated 10/27/2013. FINDINGS: Right Kidney: Renal measurements: 9.5 x 4.6 x 4.6 cm = volume: 106 mL. Mildly echogenic. No hydronephrosis. Diffuse cortical thinning with prominent renal sinus fat. Left Kidney: Renal measurements: 8.7 x 4.6 x 4.5 cm = volume: 94 mL. Borderline echogenic. No hydronephrosis. Bladder: Appears normal for degree of bladder distention. Other: None. IMPRESSION: 1. Mildly echogenic right kidney and borderline echogenic left kidney, compatible with medical renal disease. 2. No hydronephrosis. 3. Bilateral renal atrophy, greater on the right. Electronically Signed   By: Claudie Revering M.D.   On: 06/21/2019 11:56   DG Chest Portable 1 View  Result Date: 06/21/2019 CLINICAL DATA:  COVID-19 diagnosed 1 month prior, abnormal breathing since EXAM: PORTABLE CHEST 1 VIEW COMPARISON:  Chest radiograph 01/07/2014 FINDINGS: Diffuse interstitial and multifocal areas of airspace opacity with more dense consolidative opacity in the left mid lung. No pneumothorax. Small bilateral effusions. Prominence of the cardiac  silhouette likely related to low volumes and portable technique. Postsurgical changes related to prior CABG including intact and aligned sternotomy wires and multiple surgical clips projecting over the mediastinum. No acute osseous or soft tissue abnormality. Telemetry leads overlie the chest. IMPRESSION: 1. Diffuse interstitial and multifocal areas of airspace opacity with more dense consolidative opacity in the left mid lung, compatible with multifocal pneumonia. 2. Small bilateral effusions. Electronically Signed   By: Lovena Le M.D.   On: 06/21/2019 02:47   ECHOCARDIOGRAM COMPLETE  Result Date: 06/21/2019   ECHOCARDIOGRAM REPORT   Patient Name:   Bryce Garcia Date of Exam: 06/21/2019 Medical Rec #:  [id number]      Height:       68.0 in Accession #:    [id number]     Weight:       195.0 lb Date of Birth:  February 14, 1951      BSA:          2.02 m Patient Age:  68 years       BP:           139/65 mmHg Patient Gender: M              HR:           72 bpm. Exam Location:  [facility] Procedure: 2D Echo, Cardiac Doppler and Color Doppler Indications:     NSTEMI I21.4  History:         Patient has no prior history of Echocardiogram examinations.                  CAD; Arrythmias:LBBB.  Sonographer:     Alyse Low Roar Referring Phys:  [id number] Athena Masse Diagnosing Phys: Ida Rogue MD IMPRESSIONS  1. Left ventricular ejection fraction, by visual estimation, is 50 to 55%. The left ventricle has normal function. There is no left ventricular hypertrophy.  2. Left ventricular diastolic parameters are consistent with Grade II diastolic dysfunction (pseudonormalization).  3. The left ventricle has no regional wall motion abnormalities.  4. Global right ventricle has normal systolic function.The right ventricular size is normal. No increase in right ventricular wall thickness.  5. Left atrial size was mildly dilated.  6. Moderate mitral valve regurgitation.  7. TR signal is inadequate for assessing pulmonary artery  systolic pressure. FINDINGS  Left Ventricle: Left ventricular ejection fraction, by visual estimation, is 50 to 55%. The left ventricle has normal function. The left ventricle has no regional wall motion abnormalities. There is no left ventricular hypertrophy. Left ventricular diastolic parameters are consistent with Grade II diastolic dysfunction (pseudonormalization). Normal left atrial pressure. Right Ventricle: The right ventricular size is normal. No increase in right ventricular wall thickness. Global RV systolic function is has normal systolic function. Left Atrium: Left atrial size was mildly dilated. Right Atrium: Right atrial size was normal in size Pericardium: There is no evidence of pericardial effusion. Mitral Valve: The mitral valve is normal in structure. Moderate mitral valve regurgitation. No evidence of mitral valve stenosis by observation. Tricuspid Valve: The tricuspid valve is normal in structure. Tricuspid valve regurgitation is not demonstrated. Aortic Valve: The aortic valve is normal in structure. Aortic valve regurgitation is not visualized. Mild to moderate aortic valve sclerosis/calcification is present, without any evidence of aortic stenosis. Aortic valve mean gradient measures 3.0 mmHg. Aortic valve peak gradient measures 5.9 mmHg. Aortic valve area, by VTI measures 2.43 cm. Pulmonic Valve: The pulmonic valve was normal in structure. Pulmonic valve regurgitation is not visualized. Pulmonic regurgitation is not visualized. Aorta: The aortic root, ascending aorta and aortic arch are all structurally normal, with no evidence of dilitation or obstruction. Venous: The inferior vena cava is dilated in size with greater than 50% respiratory variability, suggesting right atrial pressure of 8 mmHg. IAS/Shunts: No atrial level shunt detected by color flow Doppler. There is no evidence of a patent foramen ovale. No ventricular septal defect is seen or detected. There is no evidence of an atrial  septal defect.  LEFT VENTRICLE PLAX 2D LVIDd:         4.72 cm  Diastology LVIDs:         3.81 cm  LV e' lateral:   10.40 cm/s LV PW:         1.15 cm  LV E/e' lateral: 10.8 LV IVS:        1.02 cm  LV e' medial:    4.82 cm/s LVOT diam:     2.00 cm  LV E/e' medial:  23.2  LV SV:         41 ml LV SV Index:   19.70 LVOT Area:     3.14 cm  RIGHT VENTRICLE RV Basal diam:  2.89 cm LEFT ATRIUM            Index       RIGHT ATRIUM           Index LA diam:      4.50 cm  2.23 cm/m  RA Area:     16.60 cm LA Vol (A2C): 77.8 ml  38.48 ml/m RA Volume:   45.40 ml  22.45 ml/m LA Vol (A4C): 106.0 ml 52.43 ml/m  AORTIC VALVE                   PULMONIC VALVE AV Area (Vmax):    2.08 cm    PV Vmax:        0.98 m/s AV Area (Vmean):   2.49 cm    PV Peak grad:   3.8 mmHg AV Area (VTI):     2.43 cm    RVOT Peak grad: 1 mmHg AV Vmax:           121.00 cm/s AV Vmean:          76.100 cm/s AV VTI:            0.226 m AV Peak Grad:      5.9 mmHg AV Mean Grad:      3.0 mmHg LVOT Vmax:         80.00 cm/s LVOT Vmean:        60.300 cm/s LVOT VTI:          0.175 m LVOT/AV VTI ratio: 0.77  AORTA Ao Root diam: 2.90 cm MITRAL VALVE MV Area (PHT): 3.60 cm              SHUNTS MV PHT:        61.19 msec            Systemic VTI:  0.18 m MV Decel Time: 211 msec              Systemic Diam: 2.00 cm MV E velocity: 112.00 cm/s 103 cm/s MV A velocity: 72.20 cm/s  70.3 cm/s MV E/A ratio:  1.55        1.5  Ida Rogue MD Electronically signed by Ida Rogue MD Signature Date/Time: 06/21/2019/2:10:13 PM    Final      Medications:   . cefTRIAXone (ROCEPHIN)  IV Stopped (06/21/19 2121)  . heparin 1,450 Units/hr (06/22/19 0947)   . albuterol  2 puff Inhalation Q6H  . amLODipine  10 mg Oral Daily  . aspirin EC  81 mg Oral Daily  . atorvastatin  80 mg Oral Daily  . azithromycin  500 mg Oral Daily  . carvedilol  25 mg Oral BID  . DULoxetine  30 mg Oral Daily  . furosemide  80 mg Intravenous BID  . insulin aspart  0-20 Units Subcutaneous TID WC   . insulin aspart  0-5 Units Subcutaneous QHS  . insulin aspart  5 Units Subcutaneous TID WC  . insulin detemir  25 Units Subcutaneous QHS  . levothyroxine  100 mcg Oral Daily  . pentoxifylline  400 mg Oral TID   alum & mag hydroxide-simeth, ipratropium-albuterol, traMADol  Assessment/ Plan:  [name] is a 68 y.o. white male with insulin dependent diabetes mellitus type II, hypertension, coronary artery disease status post CABG, COPD, COVID-19 infection  04/2019, chronic venous stasis ulcer, , who was admitted to [facility] on 06/21/2019 for acute exacerbation of systolic congestive heart failure. Echocardiogram 3/8 at [facility] with EF of 50%.   1. Acute kidney failure with metabolic acidosis and hyponatremia on chronic kidney disease stage IV with proteinuria: baseline creatinine of 2.85, GFR of 22 on 12/18/18.  Chronic kidney disease secondary to diabetic nephropathy. Followed by [facility], Dr. Alfonse Alpers Acute kidney injury secondary to acute cardiorenal syndrome. Not on an ACE-I/ARB at home.   2. Hypertension with acute exacerbation of systolic congestive heart failure.   3. Anemia with chronic kidney disease: normocytic. Hemoglobin of 8.6  Plan - IV furosemide - low salt diet    LOS: 1 Syanna Remmert 12/25/202010:47 AM

## 2019-06-22 NOTE — Progress Notes (Signed)
Progress Note  Patient Name: Bryce Garcia Date of Encounter: 06/22/2019  Primary Cardiologist: Festus Aloe  Subjective   He denies chest pain but does complain of worsening dyspnea.  Renal function is worsening.  Troponin increased to 5000.  Inpatient Medications    Scheduled Meds: . albuterol  2 puff Inhalation Q6H  . amLODipine  10 mg Oral Daily  . aspirin EC  81 mg Oral Daily  . atorvastatin  80 mg Oral Daily  . azithromycin  500 mg Oral Daily  . carvedilol  25 mg Oral BID  . DULoxetine  30 mg Oral Daily  . furosemide  80 mg Intravenous BID  . insulin aspart  0-20 Units Subcutaneous TID WC  . insulin aspart  0-5 Units Subcutaneous QHS  . insulin aspart  5 Units Subcutaneous TID WC  . insulin detemir  25 Units Subcutaneous QHS  . levothyroxine  100 mcg Oral Daily  . pentoxifylline  400 mg Oral TID   Continuous Infusions: . cefTRIAXone (ROCEPHIN)  IV Stopped (06/21/19 2121)  . heparin 1,450 Units/hr (06/22/19 0947)   PRN Meds: alum & mag hydroxide-simeth, ipratropium-albuterol, traMADol   Vital Signs    Vitals:   06/21/19 1300 06/21/19 1401 06/21/19 2035 06/22/19 0606  BP: 139/65 (!) 139/99 (!) 147/76 (!) 145/71  Pulse: 72 77 88 69  Resp: 19 18 20 20   Temp:  98 F (36.7 C) 97.8 F (36.6 C) 97.7 F (36.5 C)  TempSrc:  Oral Oral Oral  SpO2: 95% 98% 99% 95%  Weight:      Height:        Intake/Output Summary (Last 24 hours) at 06/22/2019 1150 Last data filed at 06/22/2019 0132 Gross per 24 hour  Intake 1063.26 ml  Output 400 ml  Net 663.26 ml   Last 3 Weights 06/21/2019 01/15/2016  Weight (lbs) 195 lb 182 lb  Weight (kg) 88.451 kg 82.555 kg      Telemetry    Sinus rhythm with IVCD.- Personally Reviewed  ECG    Sinus rhythm with IVCD.- Personally Reviewed  Physical Exam   GEN: No acute distress.   Neck: mild JVD Cardiac: RRR, no murmurs, rubs, or gallops.  Respiratory:  Positive bibasilar crackles GI: Soft, nontender, non-distended  MS:  Mild  +1 edema; No deformity. Neuro:  Nonfocal  Psych: Normal affect   Labs    High Sensitivity Troponin:   Recent Labs  Lab 06/21/19 0156 06/21/19 0412 06/21/19 0433 06/21/19 1439  TROPONINIHS 1,369* 2,780* 5,300* 8,795*      Chemistry Recent Labs  Lab 06/21/19 0154 06/22/19 0506  NA 139 132*  K 4.8 4.9  CL 104 100  CO2 21* 22  GLUCOSE 233* 417*  BUN 55* 82*  CREATININE 3.88* 4.53*  CALCIUM 8.7* 8.7*  GFRNONAA 15* 13*  GFRAA 18* 15*  ANIONGAP 14 10     Hematology Recent Labs  Lab 06/21/19 0154 06/22/19 0506  WBC 12.5* 13.3*  RBC 3.13* 2.93*  HGB 9.1* 8.6*  HCT 28.3* 26.4*  MCV 90.4 90.1  MCH 29.1 29.4  MCHC 32.2 32.6  RDW 14.0 13.6  PLT 349 350    BNP Recent Labs  Lab 06/21/19 0155  BNP 1,167.0*     DDimer No results for input(s): DDIMER in the last 168 hours.   Radiology    US RENAL  Result Date: 06/21/2019 CLINICAL DATA:  Acute kidney injury. Stage 4 chronic kidney disease. EXAM: RENAL / URINARY TRACT ULTRASOUND COMPLETE COMPARISON:  Chest CTA dated  10/27/2013. FINDINGS: Right Kidney: Renal measurements: 9.5 x 4.6 x 4.6 cm = volume: 106 mL. Mildly echogenic. No hydronephrosis. Diffuse cortical thinning with prominent renal sinus fat. Left Kidney: Renal measurements: 8.7 x 4.6 x 4.5 cm = volume: 94 mL. Borderline echogenic. No hydronephrosis. Bladder: Appears normal for degree of bladder distention. Other: None. IMPRESSION: 1. Mildly echogenic right kidney and borderline echogenic left kidney, compatible with medical renal disease. 2. No hydronephrosis. 3. Bilateral renal atrophy, greater on the right. Electronically Signed   By: Claudie Revering M.D.   On: 06/21/2019 11:56   DG Chest Portable 1 View  Result Date: 06/21/2019 CLINICAL DATA:  COVID-19 diagnosed 1 month prior, abnormal breathing since EXAM: PORTABLE CHEST 1 VIEW COMPARISON:  Chest radiograph 01/07/2014 FINDINGS: Diffuse interstitial and multifocal areas of airspace opacity with more dense  consolidative opacity in the left mid lung. No pneumothorax. Small bilateral effusions. Prominence of the cardiac silhouette likely related to low volumes and portable technique. Postsurgical changes related to prior CABG including intact and aligned sternotomy wires and multiple surgical clips projecting over the mediastinum. No acute osseous or soft tissue abnormality. Telemetry leads overlie the chest. IMPRESSION: 1. Diffuse interstitial and multifocal areas of airspace opacity with more dense consolidative opacity in the left mid lung, compatible with multifocal pneumonia. 2. Small bilateral effusions. Electronically Signed   By: Lovena Le M.D.   On: 06/21/2019 02:47   ECHOCARDIOGRAM COMPLETE  Result Date: 06/21/2019   ECHOCARDIOGRAM REPORT   Patient Name:   Bryce Garcia Date of Exam: 06/21/2019 Medical Rec #:  [id number]      Height:       68.0 in Accession #:    [id number]     Weight:       195.0 lb Date of Birth:  Dec 08, 1950      BSA:          2.02 m Patient Age:    68 years       BP:           139/65 mmHg Patient Gender: M              HR:           72 bpm. Exam Location:  [facility] Procedure: 2D Echo, Cardiac Doppler and Color Doppler Indications:     NSTEMI I21.4  History:         Patient has no prior history of Echocardiogram examinations.                  CAD; Arrythmias:LBBB.  Sonographer:     Alyse Low Roar Referring Phys:  [id number] Athena Masse Diagnosing Phys: Ida Rogue MD IMPRESSIONS  1. Left ventricular ejection fraction, by visual estimation, is 50 to 55%. The left ventricle has normal function. There is no left ventricular hypertrophy.  2. Left ventricular diastolic parameters are consistent with Grade II diastolic dysfunction (pseudonormalization).  3. The left ventricle has no regional wall motion abnormalities.  4. Global right ventricle has normal systolic function.The right ventricular size is normal. No increase in right ventricular wall thickness.  5. Left atrial size was mildly  dilated.  6. Moderate mitral valve regurgitation.  7. TR signal is inadequate for assessing pulmonary artery systolic pressure. FINDINGS  Left Ventricle: Left ventricular ejection fraction, by visual estimation, is 50 to 55%. The left ventricle has normal function. The left ventricle has no regional wall motion abnormalities. There is no left ventricular hypertrophy. Left ventricular diastolic parameters are consistent with Grade  II diastolic dysfunction (pseudonormalization). Normal left atrial pressure. Right Ventricle: The right ventricular size is normal. No increase in right ventricular wall thickness. Global RV systolic function is has normal systolic function. Left Atrium: Left atrial size was mildly dilated. Right Atrium: Right atrial size was normal in size Pericardium: There is no evidence of pericardial effusion. Mitral Valve: The mitral valve is normal in structure. Moderate mitral valve regurgitation. No evidence of mitral valve stenosis by observation. Tricuspid Valve: The tricuspid valve is normal in structure. Tricuspid valve regurgitation is not demonstrated. Aortic Valve: The aortic valve is normal in structure. Aortic valve regurgitation is not visualized. Mild to moderate aortic valve sclerosis/calcification is present, without any evidence of aortic stenosis. Aortic valve mean gradient measures 3.0 mmHg. Aortic valve peak gradient measures 5.9 mmHg. Aortic valve area, by VTI measures 2.43 cm. Pulmonic Valve: The pulmonic valve was normal in structure. Pulmonic valve regurgitation is not visualized. Pulmonic regurgitation is not visualized. Aorta: The aortic root, ascending aorta and aortic arch are all structurally normal, with no evidence of dilitation or obstruction. Venous: The inferior vena cava is dilated in size with greater than 50% respiratory variability, suggesting right atrial pressure of 8 mmHg. IAS/Shunts: No atrial level shunt detected by color flow Doppler. There is no evidence  of a patent foramen ovale. No ventricular septal defect is seen or detected. There is no evidence of an atrial septal defect.  LEFT VENTRICLE PLAX 2D LVIDd:         4.72 cm  Diastology LVIDs:         3.81 cm  LV e' lateral:   10.40 cm/s LV PW:         1.15 cm  LV E/e' lateral: 10.8 LV IVS:        1.02 cm  LV e' medial:    4.82 cm/s LVOT diam:     2.00 cm  LV E/e' medial:  23.2 LV SV:         41 ml LV SV Index:   19.70 LVOT Area:     3.14 cm  RIGHT VENTRICLE RV Basal diam:  2.89 cm LEFT ATRIUM            Index       RIGHT ATRIUM           Index LA diam:      4.50 cm  2.23 cm/m  RA Area:     16.60 cm LA Vol (A2C): 77.8 ml  38.48 ml/m RA Volume:   45.40 ml  22.45 ml/m LA Vol (A4C): 106.0 ml 52.43 ml/m  AORTIC VALVE                   PULMONIC VALVE AV Area (Vmax):    2.08 cm    PV Vmax:        0.98 m/s AV Area (Vmean):   2.49 cm    PV Peak grad:   3.8 mmHg AV Area (VTI):     2.43 cm    RVOT Peak grad: 1 mmHg AV Vmax:           121.00 cm/s AV Vmean:          76.100 cm/s AV VTI:            0.226 m AV Peak Grad:      5.9 mmHg AV Mean Grad:      3.0 mmHg LVOT Vmax:         80.00 cm/s LVOT Vmean:  60.300 cm/s LVOT VTI:          0.175 m LVOT/AV VTI ratio: 0.77  AORTA Ao Root diam: 2.90 cm MITRAL VALVE MV Area (PHT): 3.60 cm              SHUNTS MV PHT:        61.19 msec            Systemic VTI:  0.18 m MV Decel Time: 211 msec              Systemic Diam: 2.00 cm MV E velocity: 112.00 cm/s 103 cm/s MV A velocity: 72.20 cm/s  70.3 cm/s MV E/A ratio:  1.55        1.5  Ida Rogue MD Electronically signed by Ida Rogue MD Signature Date/Time: 06/21/2019/2:10:13 PM    Final     Cardiac Studies   Echocardiogram done yesterday:   1. Left ventricular ejection fraction, by visual estimation, is 50 to 55%. The left ventricle has normal function. There is no left ventricular hypertrophy.  2. Left ventricular diastolic parameters are consistent with Grade II diastolic dysfunction (pseudonormalization).  3.  The left ventricle has no regional wall motion abnormalities.  4. Global right ventricle has normal systolic function.The right ventricular size is normal. No increase in right ventricular wall thickness.  5. Left atrial size was mildly dilated.  6. Moderate mitral valve regurgitation.  7. TR signal is inadequate for assessing pulmonary artery systolic pressure.  Patient Profile     68 y.o. male with a hx of CAD s/p 4-vessel CABG with LIMA-LAD, SVG-diagonal, SVG to OM, SVG to RCA with non-STEMI in 07/2015 status post PCI/DES to LCx and OM1 with recurrent 99% ISR of OM1 in 12/2015 status post PCI/DES, HFrEF secondary to ICM with prior EF of 40% improved to 50% by echo in 08/2018, recent COVID-19 infection approximately 4 weeks prior CKD stage III with a baseline serum creatinine of approximately 2.4, venous ulcer of the left leg, diabetes, LBBB, HTN, and COPD who is being seen today for the evaluation of  non-ST elevation myocardial infarction  Assessment & Plan    1.  Non-ST elevation myocardial infarction: Troponin increased to 5000.  Currently with no chest pain.  The patient has known history of coronary artery disease status post CABG and PCI as outlined above.  Continue heparin until tomorrow to finish 48 hours.  Continue low-dose aspirin and atorvastatin. We are hoping to be able to do cardiac catheterization next week.  However, his renal function continues to worsen with creatinine above 4.  The patient might ultimately require to go on dialysis given advanced chronic kidney disease at baseline.  2.  Acute diastolic heart failure: EF was 50 to 55%.  The patient appears to be moderately volume overloaded which I suspect is worsened by acute on chronic renal failure and low urine output.  I agree with IV furosemide for now with up titration as needed.  3.  Suspected pneumonia and recent Covid infection about 4 weeks ago.  Currently on ceftriaxone.  4.  Essential hypertension: Blood pressure  seems to be controlled on carvedilol and amlodipine.  5.  Anemia: He does have anemia of chronic disease with recent worsening.  Hemoglobin is in the 8 range .  Monitor hemoglobin while on heparin drip.  Currently no indication for transfusion.      For questions or updates, please contact [place] Please consult [email or web address] for contact info under  Signed, Kathlyn Sacramento, MD  06/22/2019, 11:50 AM

## 2019-06-23 DIAGNOSIS — I252 Old myocardial infarction: Secondary | ICD-10-CM

## 2019-06-23 DIAGNOSIS — I2510 Atherosclerotic heart disease of native coronary artery without angina pectoris: Secondary | ICD-10-CM

## 2019-06-23 DIAGNOSIS — J9601 Acute respiratory failure with hypoxia: Secondary | ICD-10-CM

## 2019-06-23 DIAGNOSIS — N179 Acute kidney failure, unspecified: Secondary | ICD-10-CM

## 2019-06-23 LAB — CBC
HCT: 26.8 % — ABNORMAL LOW (ref 39.0–52.0)
Hemoglobin: 9.1 g/dL — ABNORMAL LOW (ref 13.0–17.0)
MCH: 29.5 pg (ref 26.0–34.0)
MCHC: 34 g/dL (ref 30.0–36.0)
MCV: 87 fL (ref 80.0–100.0)
Platelets: 375 10*3/uL (ref 150–400)
RBC: 3.08 MIL/uL — ABNORMAL LOW (ref 4.22–5.81)
RDW: 13.9 % (ref 11.5–15.5)
WBC: 19.4 10*3/uL — ABNORMAL HIGH (ref 4.0–10.5)
nRBC: 0.1 % (ref 0.0–0.2)

## 2019-06-23 LAB — BASIC METABOLIC PANEL
Anion gap: 16 — ABNORMAL HIGH (ref 5–15)
BUN: 90 mg/dL — ABNORMAL HIGH (ref 8–23)
CO2: 18 mmol/L — ABNORMAL LOW (ref 22–32)
Calcium: 8.4 mg/dL — ABNORMAL LOW (ref 8.9–10.3)
Chloride: 102 mmol/L (ref 98–111)
Creatinine, Ser: 4.62 mg/dL — ABNORMAL HIGH (ref 0.61–1.24)
GFR calc Af Amer: 14 mL/min — ABNORMAL LOW (ref >60)
GFR calc non Af Amer: 12 mL/min — ABNORMAL LOW (ref >60)
Glucose, Bld: 118 mg/dL — ABNORMAL HIGH (ref 70–99)
Potassium: 4.5 mmol/L (ref 3.5–5.1)
Sodium: 136 mmol/L (ref 135–145)

## 2019-06-23 LAB — GLUCOSE, CAPILLARY
Glucose-Capillary: 99 mg/dL (ref 70–99)
Glucose-Capillary: 244 mg/dL — ABNORMAL HIGH (ref 70–99)
Glucose-Capillary: 166 mg/dL — ABNORMAL HIGH (ref 70–99)
Glucose-Capillary: 213 mg/dL — ABNORMAL HIGH (ref 70–99)

## 2019-06-23 LAB — HEPARIN LEVEL (UNFRACTIONATED)
Heparin Unfractionated: 0.28 IU/mL — ABNORMAL LOW (ref 0.30–0.70)
Heparin Unfractionated: 0.35 IU/mL (ref 0.30–0.70)
Heparin Unfractionated: 0.6 IU/mL (ref 0.30–0.70)

## 2019-06-23 MED ORDER — INSULIN ASPART 100 UNIT/ML ~~LOC~~ SOLN
4.0000 [IU] | Freq: Three times a day (TID) | SUBCUTANEOUS | Status: DC
  Administered 2019-06-23 – 2019-06-27 (×14): 4 [IU] via SUBCUTANEOUS
  Filled 2019-06-23 (×12): qty 1

## 2019-06-23 MED ORDER — CHLORHEXIDINE GLUCONATE CLOTH 2 % EX PADS
6.0000 | MEDICATED_PAD | Freq: Every day | CUTANEOUS | Status: DC
  Administered 2019-06-23 – 2019-06-26 (×4): 6 via TOPICAL

## 2019-06-23 MED ORDER — TUBERCULIN PPD 5 UNIT/0.1ML ID SOLN
5.0000 [IU] | Freq: Once | INTRADERMAL | Status: AC
  Administered 2019-06-23: 5 [IU] via INTRADERMAL
  Filled 2019-06-23: qty 0.1

## 2019-06-23 NOTE — Progress Notes (Signed)
OT Cancellation Note  Patient Details Name: Bryce Garcia MRN: [id number] DOB: 12-31-1950   Cancelled Treatment:    Reason Eval/Treat Not Completed: Medical issues which prohibited therapy;Patient at procedure or test/ unavailable(Pt. having a Central Venous Dialysis Catheter placed. Pt.'s last troponin level 5000 on 06/22/2019. Will continue to monitor and perform inital OT visit at a later time, or date.Marland KitchenHarrel Carina, MS, OTR/L 06/23/2019, 1:52 PM

## 2019-06-23 NOTE — Progress Notes (Signed)
HD Tx completed   06/23/19 1745  Vital Signs  Pulse Rate 72  Resp 20  BP (!) 156/76  Oxygen Therapy  SpO2 95 %  O2 Device Nasal Cannula  O2 Flow Rate (L/min) 7 L/min  Pain Assessment  Pain Scale 0-10  Pain Score 0  During Hemodialysis Assessment  Blood Flow Rate (mL/min) 200 mL/min  Arterial Pressure (mmHg) -80 mmHg  Venous Pressure (mmHg) 80 mmHg  Transmembrane Pressure (mmHg) 50 mmHg  Ultrafiltration Rate (mL/min) 770 mL/min  Dialysate Flow Rate (mL/min) 300 ml/min  Conductivity: Machine  13.8  HD Safety Checks Performed Yes  Intra-Hemodialysis Comments Tx completed;Tolerated well     06/23/19 1745  Vital Signs  Pulse Rate 72  Resp 20  BP (!) 156/76  Oxygen Therapy  SpO2 95 %  O2 Device Nasal Cannula  O2 Flow Rate (L/min) 7 L/min  Pain Assessment  Pain Scale 0-10  Pain Score 0  During Hemodialysis Assessment  Blood Flow Rate (mL/min) 200 mL/min  Arterial Pressure (mmHg) -80 mmHg  Venous Pressure (mmHg) 80 mmHg  Transmembrane Pressure (mmHg) 50 mmHg  Ultrafiltration Rate (mL/min) 770 mL/min  Dialysate Flow Rate (mL/min) 300 ml/min  Conductivity: Machine  13.8  HD Safety Checks Performed Yes  Intra-Hemodialysis Comments Tx completed;Tolerated well

## 2019-06-23 NOTE — Consult Note (Signed)
Name: Bryce Garcia MRN: [id number] DOB: [birth year]     CONSULTATION DATE: 06/23/2019  REFERRING MD : Abigail Butts  CHIEF COMPLAINT: SOB    HISTORY OF PRESENT ILLNESS:  68 y.o. white malewith insulin dependent diabetes mellitus type II, hypertension, coronary artery disease status post CABG, COPD, COVID-19 infection 04/2019, chronic venous stasis ulcer,, who was admitted to [facility] on12/24/2020for acute exacerbation of systolic congestive heart failure. Echocardiogram 3/8 at [facility] with EF of 50%.   Patient with progressive cardiorenal syndrome, needs HD I was consulted for vasc cath placement  I attempted RT IJ but unalbe to thread quide wire after several sticks of the IJ. I was able to get flash back of blood but unable to thread guidewire. I then proceeded with RT femoral Vein Catheter placement I was able to see vein and aspirate blood and place guidewire through.  See OP note for further assessment. U used [place] through the procedure as patient is on heparin infusion.      PAST MEDICAL HISTORY :   has a past medical history of CAD (coronary artery disease), CKD (chronic kidney disease), stage III, COPD (chronic obstructive pulmonary disease) ([place]), COVID-19, Diabetes mellitus without complication ([place]), HFrEF (heart failure with reduced ejection fraction) ([place]), LBBB (left bundle branch block), and Venous ulcer ([place]).  has no past surgical history on file. Prior to Admission medications   Medication Sig Start Date End Date Taking? Authorizing Provider  amLODipine (NORVASC) 10 MG tablet Take 10 mg by mouth daily. 04/09/19  Yes [provider]  aspirin 81 MG EC tablet Take by mouth. 12/08/06  Yes [provider]  atorvastatin (LIPITOR) 80 MG tablet Take 80 mg by mouth daily. 04/30/19  Yes [provider]  carvedilol (COREG) 25 MG tablet Take 25 mg by mouth 2 (two) times daily. 04/05/19  Yes [provider]  clopidogrel (PLAVIX) 75 MG tablet Take 75  mg by mouth daily. 04/30/19  Yes [provider]  DULoxetine (CYMBALTA) 30 MG capsule Take 30 mg by mouth daily. 05/22/19  Yes [provider]  EUTHYROX 100 MCG tablet Take 100 mcg by mouth daily. 03/18/19  Yes [provider]  furosemide (LASIX) 80 MG tablet Take 80 mg by mouth daily. 04/30/19  Yes [provider]  gabapentin (NEURONTIN) 300 MG capsule Take 300 mg by mouth 2 (two) times daily. 06/15/19  Yes [provider]  insulin NPH Human (NOVOLIN N) 100 UNIT/ML injection Inject 5 units sq qam and 19 units sq q hs, adjust as directed up to 30 units daily 04/17/18  Yes [provider]  insulin regular (NOVOLIN R) 100 units/mL injection Inject 5-20 Units into the skin 3 (three) times daily before meals. Depending on blood glucose level 04/17/18  Yes [provider]  pentoxifylline (TRENTAL) 400 MG CR tablet Take 400 mg by mouth 3 (three) times daily. 06/12/19  Yes [provider]  traMADol (ULTRAM) 50 MG tablet Take 50 mg by mouth every 6 (six) hours as needed. 05/17/19  Yes [provider]   Allergies  Allergen Reactions  . Other Other (See Comments)    Hypoglycemia unawareness Hypoglycemia unawareness     FAMILY HISTORY:  family history includes Cancer in his father; Diabetes in his mother; Hyperlipidemia in his mother; Hypertension in his mother. SOCIAL HISTORY:  reports that he has never smoked. He has never used smokeless tobacco. He reports previous alcohol use. He reports that he does not use drugs.    Review of Systems:  Gen:  Denies  fever, sweats, chills weigh loss  HEENT: Denies blurred vision, double vision, ear pain, eye pain, hearing loss, nose bleeds, sore throat Cardiac:  No dizziness, chest pain or heaviness, chest tightness,edema, No JVD Resp:   +SOB Gi: Denies swallowing difficulty, stomach pain, nausea or vomiting, diarrhea, constipation, bowel incontinence Gu:  Denies bladder  incontinence, burning urine Ext:   +swelling Skin: Denies  skin rash, easy bruising or bleeding or hives Endoc:  Denies polyuria, polydipsia , polyphagia or weight change Psych:   Denies depression, insomnia or hallucinations  Other:  All other systems negative     VITAL SIGNS: Temp:  [97.5 F (36.4 C)-98.2 F (36.8 C)] 97.5 F (36.4 C) (12/26 1123) Pulse Rate:  [66-73] 66 (12/26 1123) Resp:  [16-20] 16 (12/26 1123) BP: (143-153)/(62-90) 151/62 (12/26 1123) SpO2:  [85 %-94 %] 85 % (12/26 1123)     SpO2: (!) 85 % O2 Flow Rate (L/min): 2 L/min    Physical Examination:   GENERAL:NAD, no fevers, chills, no weakness no fatigue HEAD: Normocephalic, atraumatic.  EYES: PERLA, EOMI No scleral icterus.  MOUTH: Moist mucosal membrane.  EAR, NOSE, THROAT: Clear without exudates. No external lesions.  NECK: Supple.  PULMONARY: CTA B/L +crackles CARDIOVASCULAR: S1 and S2. Regular rate and rhythm. No murmurs GASTROINTESTINAL: Soft, nontender, nondistended. Positive bowel sounds.  MUSCULOSKELETAL: + edema.  NEUROLOGIC: No gross focal neurological deficits. 5/5 strength all extremities SKIN: No ulceration, lesions, rashes, or cyanosis.  PSYCHIATRIC: Insight, judgment intact. -depression -anxiety ALL OTHER ROS ARE NEGATIVE   MEDICATIONS: I have reviewed all medications and confirmed regimen as documented    CULTURE RESULTS   Recent Results (from the past 240 hour(s))  Blood Culture (routine x 2)     Status: None (Preliminary result)   Collection Time: 06/21/19  2:08 AM   Specimen: BLOOD  Result Value Ref Range Status   Specimen Description BLOOD BLOOD RIGHT FOREARM  Final   Special Requests   Final    BOTTLES DRAWN AEROBIC AND ANAEROBIC Blood Culture adequate volume   Culture   Final    NO GROWTH 2 DAYS Performed at [facility], [address], [place], [place] [address]    Report Status PENDING  Incomplete  Blood Culture (routine x 2)     Status: None  (Preliminary result)   Collection Time: 06/21/19  2:13 AM   Specimen: BLOOD  Result Value Ref Range Status   Specimen Description BLOOD LEFT ANTECUBITAL  Final   Special Requests   Final    BOTTLES DRAWN AEROBIC AND ANAEROBIC Blood Culture results may not be optimal due to an inadequate volume of blood received in culture bottles   Culture   Final    NO GROWTH 2 DAYS Performed at [facility], [address] [place], [place] [address]    Report Status PENDING  Incomplete  Respiratory Panel by PCR     Status: None   Collection Time: 06/21/19 11:56 AM   Specimen: Nasopharyngeal Swab; Respiratory  Result Value Ref Range Status   Adenovirus NOT DETECTED NOT DETECTED Final   Coronavirus 229E NOT DETECTED NOT DETECTED Final    Comment: (NOTE) The Coronavirus on the Respiratory Panel, DOES NOT test for the novel  Coronavirus (2019 nCoV)    Coronavirus HKU1 NOT DETECTED NOT DETECTED Final   Coronavirus NL63 NOT DETECTED NOT DETECTED Final   Coronavirus OC43 NOT DETECTED NOT DETECTED Final   Metapneumovirus NOT DETECTED NOT DETECTED Final   Rhinovirus /  Enterovirus NOT DETECTED NOT DETECTED Final   Influenza A NOT DETECTED NOT DETECTED Final   Influenza B NOT DETECTED NOT DETECTED Final   Parainfluenza Virus 1 NOT DETECTED NOT DETECTED Final   Parainfluenza Virus 2 NOT DETECTED NOT DETECTED Final   Parainfluenza Virus 3 NOT DETECTED NOT DETECTED Final   Parainfluenza Virus 4 NOT DETECTED NOT DETECTED Final   Respiratory Syncytial Virus NOT DETECTED NOT DETECTED Final   Bordetella pertussis NOT DETECTED NOT DETECTED Final   Chlamydophila pneumoniae NOT DETECTED NOT DETECTED Final   Mycoplasma pneumoniae NOT DETECTED NOT DETECTED Final    Comment: Performed at [facility], [place] [address] [place], [place] [address]             ASSESSMENT AND PLAN SYNOPSIS  68 y.o. white malewith insulin dependent diabetes mellitus type II, hypertension, coronary artery disease  status post CABG, COPD, COVID-19 infection 04/2019, chronic venous stasis ulcer,, who was admitted to [facility] on12/24/2020for acute exacerbation of systolic congestive heart failure. Progressive cardiorenal syndrome consulted for vasc cath placement.  No complications noted. May use Line for HD as needed    Patient/Family are satisfied with Plan of action and management. All questions answered  Corrin Parker, M.D.  [facility]  Medical Director [place] Director [facility] Cardio-Pulmonary Department

## 2019-06-23 NOTE — Progress Notes (Signed)
ANTICOAGULATION CONSULT NOTE  Pharmacy Consult for Heparin Indication: chest pain/ACS  Patient Measurements: Height: 5\' 8"  (172.7 cm) Weight: 197 lb 5 oz (89.5 kg) IBW/kg (Calculated) : 68.4 HEPARIN DW (KG): 86.4  Vital Signs: Temp: 98.3 F (36.8 C) (12/26 1530) Temp Source: Oral (12/26 1530) BP: 146/60 (12/26 1630) Pulse Rate: 66 (12/26 1630)  Labs: Recent Labs    06/21/19 0154 06/21/19 0155 06/21/19 0156 06/21/19 0412 06/21/19 0433 06/21/19 1439 06/22/19 0506 06/22/19 1910 06/23/19 0317 06/23/19 0630 06/23/19 1530  HGB 9.1*  --   --   --   --   --  8.6*  --   --  9.1*  --   HCT 28.3*  --   --   --   --   --  26.4*  --   --  26.8*  --   PLT 349  --   --   --   --   --  350  --   --  375  --   APTT  --   --   --   --   --  52*  --   --   --   --   --   LABPROT  --  13.1  --   --   --   --   --   --   --   --   --   INR  --  1.0  --   --   --   --   --   --   --   --   --   HEPARINUNFRC  --   --    < >  --   --  0.12*  --  0.49 0.28*  --  0.35  CREATININE 3.88*  --   --   --   --   --  4.53*  --  4.62*  --   --   TROPONINIHS  --   --   --  2,780* 5,300* 8,795*  --   --   --   --   --    < > = values in this interval not displayed.    Estimated Creatinine Clearance: 17.1 mL/min (A) (by C-G formula based on SCr of 4.62 mg/dL (H)).   Medical History: Past Medical History:  Diagnosis Date  . CAD (coronary artery disease)   . CKD (chronic kidney disease), stage III   . COPD (chronic obstructive pulmonary disease) ([place])   . COVID-19   . Diabetes mellitus without complication ([place])   . HFrEF (heart failure with reduced ejection fraction) ([place])   . LBBB (left bundle branch block)   . Venous ulcer [facility])     Assessment: 68 y.o. male with a hx of CAD s/p 4-vessel CABG with LIMA-LAD, SVG-diagonal, SVG to OM, SVG to RCA with non-STEMI in 07/2015 status post PCI/DES. Pharmacy asked to initiate and monitor Heparin for ACS.  No anticoagulants except aspirin and clopidogrel  PTA per current med list. HS troponin rising; Baseline labs: INR 1.0, H&H: 9.1/28.3, PLT wnl  Heparin Course: 12/24 4000 unit bolus, then 1000 units/hr 12/24 1439 aPTT 52s, HL 0.12 12/25 0016 HL 0.32, therapeutic x 1 12/25 0752 HL 0.17, subtherapeutic. Will bolus 1200 units and increase rate to 1450 units/hr.  12/25 1910 HL 0.49, therapeutic 12/26 0317 HL 0.28, subtherapeutic but only barely below goal 12/26 1530 HL 0.35, therapeutic  Goal of Therapy:  Heparin level 0.3-0.7 units/ml Monitor platelets by anticoagulation protocol: Yes  Plan:  Heparin level therapeutic:  Will continue Heparin infusion at 1550 units/hr.  Check HL in 8 hours. Daily CBC.  Pearla Dubonnet, PharmD 06/23/2019,4:52 PM

## 2019-06-23 NOTE — Plan of Care (Signed)
  Problem: Clinical Measurements: Goal: Ability to maintain clinical measurements within normal limits will improve Outcome: Progressing Goal: Will remain free from infection Outcome: Progressing Goal: Diagnostic test results will improve Outcome: Progressing Goal: Respiratory complications will improve Outcome: Progressing Goal: Cardiovascular complication will be avoided Outcome: Progressing  Pt is curretnly receiving 4.5L O2 per [place]. Pt is receiving HD Tx Will keep monitoring

## 2019-06-23 NOTE — Evaluation (Signed)
Physical Therapy Evaluation Patient Details Name: Bryce Garcia MRN: [id number] DOB: Jun 27, 1951 Today's Date: 06/23/2019   History of Present Illness  This 68 yo male was referred to PT after admission for PNA post-covid.  Has been given to HD today with dense tissue fluid retention, and note his hypoxic respiratory failure.  Pt is on bedrest with no flexion of R hip, but was up to side of bed when PT arrived.  PMHx:  Covid infection, COPD, CABG, DM, CHF, CKD4, EF 50%  Clinical Impression  Pt is up to side of bed when PT arrived in his room and did not know he was on bedrest.  Pt was returned to bed and noted his O2 sats were 87% with 3L O2, but gently recovered in bed to 92%.  Pt will be seen for bed level ex's only to strengthen LLE and do isometric R knee and ankle ex.  Pt will be avoiding use of R hip for now to prevent disruption of his HD cath.    Follow Up Recommendations SNF    Equipment Recommendations  None recommended by PT    Recommendations for Other Services       Precautions / Restrictions Precautions Precautions: Fall Precaution Comments: new HD R femoral catheter temp Restrictions Weight Bearing Restrictions: No Other Position/Activity Restrictions: no flexion R hip      Mobility  Bed Mobility Overal bed mobility: Modified Independent Bed Mobility: Supine to Sit;Sit to Supine     Supine to sit: Modified independent (Device/Increase time) Sit to supine: Min assist   General bed mobility comments: pt was up to side of bed with no help and PT assisted him to bed  Transfers Overall transfer level: Needs assistance Equipment used: Rolling walker (2 wheeled);1 person hand held assist Transfers: Sit to/from Stand Sit to Stand: Mod assist         General transfer comment: mod assist to stand and get repositioned back to bed  Ambulation/Gait             General Gait Details: unable with new HD cath  Stairs            Wheelchair Mobility     Modified Rankin (Stroke Patients Only)       Balance Overall balance assessment: Needs assistance Sitting-balance support: Feet supported Sitting balance-Leahy Scale: Good     Standing balance support: Bilateral upper extremity supported;During functional activity Standing balance-Leahy Scale: Poor                               Pertinent Vitals/Pain Pain Assessment: No/denies pain    Home Living Family/patient expects to be discharged to:: Private residence Living Arrangements: Alone Available Help at Discharge: Family;Available PRN/intermittently Type of Home: House       Home Layout: One level        Prior Function Level of Independence: Needs assistance;Independent   Gait / Transfers Assistance Needed: independent until 4 days ago with SOB  ADL's / Homemaking Assistance Needed: was home and independent with self care and housework  Comments: pt had a rapid decline since recovering from Covid a month ago     Hand Dominance   Dominant Hand: Right    Extremity/Trunk Assessment   Upper Extremity Assessment Upper Extremity Assessment: Generalized weakness    Lower Extremity Assessment Lower Extremity Assessment: Generalized weakness    Cervical / Trunk Assessment Cervical / Trunk Assessment: Normal  Communication  Communication: No difficulties  Cognition Arousal/Alertness: Awake/alert Behavior During Therapy: WFL for tasks assessed/performed Overall Cognitive Status: Within Functional Limits for tasks assessed                                 General Comments: pt was not aware that he was on bedrest      General Comments General comments (skin integrity, edema, etc.): pt was up on side of bed when PT arrived and noted he had permission to be OOB.  Pt was returned to bed when PT arrived    Exercises     Assessment/Plan    PT Assessment Patient needs continued PT services  PT Problem List Decreased strength;Decreased  range of motion;Decreased activity tolerance;Decreased balance;Decreased mobility;Decreased coordination;Decreased knowledge of use of DME;Decreased safety awareness;Decreased knowledge of precautions;Cardiopulmonary status limiting activity;Decreased skin integrity       PT Treatment Interventions Therapeutic exercise;Patient/family education;Other (comment)(BED LEVEL THERAPY)    PT Goals (Current goals can be found in the Care Plan section)  Acute Rehab PT Goals Patient Stated Goal: to get home and get stronger PT Goal Formulation: With patient Time For Goal Achievement: 07/07/19 Potential to Achieve Goals: Good Additional Goals Additional Goal #1: Pt will demonstrate independent awareness of ex techniques and safety with R hip HD catheter uncued    Frequency Min 2X/week   Barriers to discharge Decreased caregiver support home alone with O2 sats dropping to move    Co-evaluation               AM-PAC PT "6 Clicks" Mobility  Outcome Measure Help needed turning from your back to your side while in a flat bed without using bedrails?: A Lot Help needed moving from lying on your back to sitting on the side of a flat bed without using bedrails?: A Little Help needed moving to and from a bed to a chair (including a wheelchair)?: A Little Help needed standing up from a chair using your arms (e.g., wheelchair or bedside chair)?: A Lot Help needed to walk in hospital room?: Total Help needed climbing 3-5 steps with a railing? : Total 6 Click Score: 12    End of Session Equipment Utilized During Treatment: Oxygen Activity Tolerance: Patient limited by fatigue;Treatment limited secondary to medical complications (Comment) Patient left: in bed;with call bell/phone within reach;with bed alarm set Nurse Communication: Mobility status;Other (comment)(O2 sats declining) PT Visit Diagnosis: Muscle weakness (generalized) (M62.81);Difficulty in walking, not elsewhere classified  (R26.2);Adult, failure to thrive (R62.7)    Time: 1440-1506 PT Time Calculation (min) (ACUTE ONLY): 26 min   Charges:   PT Evaluation $PT Eval Moderate Complexity: 1 Mod PT Treatments $Therapeutic Activity: 8-22 mins       Ramond Dial 06/23/2019, 5:17 PM   Mee Hives, PT MS Acute Rehab Dept. Number: [place]

## 2019-06-23 NOTE — Progress Notes (Signed)
Post HD Tx Assessment    06/23/19 1747  Neurological  Level of Consciousness Alert  Orientation Level Oriented X4  Respiratory  Respiratory Pattern Regular;Accessory muscle use  Chest Assessment Chest expansion symmetrical  R Upper  Breath Sounds Inspiratory wheezes  L Upper Breath Sounds Inspiratory wheezes  R Lower Breath Sounds Diminished;Fine crackles  L Lower Breath Sounds Fine crackles;Diminished  Cough Non-productive  Cardiac  Pulse Regular  Jugular Venous Distention (JVD) No  ECG Monitor Yes  Antiarrhythmic device No  Vascular  R Radial Pulse +2  L Radial Pulse +2  Edema Right upper extremity;Left upper extremity;Right lower extremity;Left lower extremity  RUE Edema +1  LUE Edema +1  RLE Edema +1  LLE Edema +1  Integumentary  Integumentary (WDL) X  Skin Color Other (Comment);Appropriate for ethnicity (lower extremities brownish coloration )  Skin Condition Dry;Flaky  Skin Integrity Cracking  Cracking Location Foot  Cracking Location Orientation Right;Left;Posterior  Musculoskeletal  Musculoskeletal (WDL) WDL  Gastrointestinal  Bowel Sounds Assessment Hypoactive  Last BM Date 06/22/19  GU Assessment  Genitourinary (WDL) WDL  Psychosocial  Psychosocial (WDL) WDL

## 2019-06-23 NOTE — Progress Notes (Signed)
Progress Note  Patient Name: Bryce Garcia Date of Encounter: 06/23/2019  Primary Cardiologist: [facility]  Subjective   Reports breathing is moderately improved, Denies leg swelling, abdominal bloating has resolved Central line placed today right femoral vein Wound on his left leg shin, was splitting wood when a piece of wood hit him in the leg Reports he is required morphine this admission for pain in the area He does have follow-up with vascular surgery at [facility] next month for leg pain Son reports he was started on Pletal  Inpatient Medications    Scheduled Meds: . albuterol  2 puff Inhalation Q6H  . amLODipine  10 mg Oral Daily  . aspirin EC  81 mg Oral Daily  . atorvastatin  80 mg Oral Daily  . azithromycin  500 mg Oral Daily  . carvedilol  25 mg Oral BID  . Chlorhexidine Gluconate Cloth  6 each Topical Daily  . DULoxetine  30 mg Oral Daily  . insulin aspart  0-20 Units Subcutaneous TID WC  . insulin aspart  0-5 Units Subcutaneous QHS  . insulin aspart  4 Units Subcutaneous TID WC  . insulin detemir  25 Units Subcutaneous QHS  . levothyroxine  100 mcg Oral Daily  . pentoxifylline  400 mg Oral TID  . tuberculin  5 Units Intradermal Once   Continuous Infusions: . cefTRIAXone (ROCEPHIN)  IV Stopped (06/22/19 1801)  . heparin 1,550 Units/hr (06/23/19 1250)   PRN Meds: alum & mag hydroxide-simeth, ipratropium-albuterol, traMADol   Vital Signs    Vitals:   06/22/19 2130 06/23/19 0452 06/23/19 0929 06/23/19 1123  BP: (!) 143/68 (!) 153/90 (!) 149/66 (!) 151/62  Pulse: 73 70 70 66  Resp: 20 20  16   Temp: 98.2 F (36.8 C) 97.7 F (36.5 C)  (!) 97.5 F (36.4 C)  TempSrc: Oral Oral  Oral  SpO2: 93% 93% 94% (!) 85%  Weight:      Height:        Intake/Output Summary (Last 24 hours) at 06/23/2019 1339 Last data filed at 06/23/2019 0940 Gross per 24 hour  Intake 810.02 ml  Output 500 ml  Net 310.02 ml   Last 3 Weights 06/21/2019 01/15/2016  Weight (lbs) 195 lb 182  lb  Weight (kg) 88.451 kg 82.555 kg      Telemetry    Normal sinus rhythm- Personally Reviewed  ECG     - Personally Reviewed  Physical Exam   GEN: No acute distress.   Neck: No JVD Cardiac: RRR, no murmurs, rubs, or gallops.  Respiratory: Clear to auscultation bilaterally. GI: Soft, nontender, non-distended  MS: No edema; No deformity. Neuro:  Nonfocal  Psych: Normal affect   Labs    High Sensitivity Troponin:   Recent Labs  Lab 06/21/19 0156 06/21/19 0412 06/21/19 0433 06/21/19 1439  TROPONINIHS 1,369* 2,780* 5,300* 8,795*      Chemistry Recent Labs  Lab 06/21/19 0154 06/22/19 0506 06/23/19 0317  NA 139 132* 136  K 4.8 4.9 4.5  CL 104 100 102  CO2 21* 22 18*  GLUCOSE 233* 417* 118*  BUN 55* 82* 90*  CREATININE 3.88* 4.53* 4.62*  CALCIUM 8.7* 8.7* 8.4*  GFRNONAA 15* 13* 12*  GFRAA 18* 15* 14*  ANIONGAP 14 10 16*     Hematology Recent Labs  Lab 06/21/19 0154 06/22/19 0506 06/23/19 0630  WBC 12.5* 13.3* 19.4*  RBC 3.13* 2.93* 3.08*  HGB 9.1* 8.6* 9.1*  HCT 28.3* 26.4* 26.8*  MCV 90.4 90.1 87.0  MCH 29.1 29.4 29.5  MCHC 32.2 32.6 34.0  RDW 14.0 13.6 13.9  PLT 349 350 375    BNP Recent Labs  Lab 06/21/19 0155  BNP 1,167.0*     DDimer No results for input(s): DDIMER in the last 168 hours.   Radiology    No results found.  Cardiac Studies   Echocardiogram   1. Left ventricular ejection fraction, by visual estimation, is 50 to 55%. The left ventricle has normal function. There is no left ventricular hypertrophy.  2. Left ventricular diastolic parameters are consistent with Grade II diastolic dysfunction (pseudonormalization).  3. The left ventricle has no regional wall motion abnormalities.  4. Global right ventricle has normal systolic function.The right ventricular size is normal. No increase in right ventricular wall thickness.  5. Left atrial size was mildly dilated.  6. Moderate mitral valve regurgitation.  7. TR signal is  inadequate for assessing pulmonary artery systolic pressure.  Patient Profile     68 year old gentleman with past medical history of coronary disease, bypass surgery February 2017, prior stenting to left circumflex, OM/in-stent restenosis and restenting, ejection fraction 50% March 2020, recent Covid infection 1 month ago, chronic kidney disease stage III, type 2 diabetes, COPD, presenting with worsening shortness of breath.  Assessment & Plan    Non-STEMI In the setting of acute respiratory distress/possible pneumonia, saturation 68% on room air on arrival, suspected pneumonitis post Covid -Echocardiogram with ejection fraction 50 to 55%, grossly no regional wall motion abnormalities --- Given worsening renal dysfunction, likely needing hemodialysis bridge for ATN, would hold off on cardiac catheterization, consider pharmacologic Myoview  Acute respiratory distress Concern for pneumonia, multifocal on chest x-ray, post Covid pneumonitis Denies significant cough or sputum production -Feels improved, on 2 L nasal cannula, reports symptoms are" moderate", has chronic shortness of breath -On broad-spectrum antibiotics  Chronic diastolic CHF   significant abdominal bloating/swelling consistent with fluid retention on arrival -On Lasix IV twice daily, abdominal bloating swelling resolved -Now with worsening renal dysfunction Unable to exclude ATN Given is not very symptomatic on 2 L nasal cannula oxygen, echocardiogram not particularly impressive for fluid overload,  Would recommend we hold the Lasix and trend the renal function --Case discussed with nephrology and family at the bedside Plan for bridging dialysis  Acute on chronic renal failure Concern for ATN in the setting of hypoxia He does have bilateral renal atrophy greater on the right on ultrasound -Suggest we proceed cautiously with diuretics given he is minimally symptomatic at this time  Long discussion with family at the  bedside, nephrology  Total encounter time more than 35 minutes  Greater than 50% was spent in counseling and coordination of care with the patient   For questions or updates, please contact [facility] Please consult [email or web address] for contact info under        Signed, Ida Rogue, MD  06/23/2019, 1:39 PM

## 2019-06-23 NOTE — Progress Notes (Signed)
PROGRESS NOTE    [name]  [id number] DOB: [birth year] DOA: 06/21/2019 PCP: [facility]    Brief Narrative:  Bryce Garcia a 68 y.o.malewith medical history significanttype 1 diabetes, CAD status post CABG, systolic heart failure, last EF 50%, CKD 4, type 1 diabetes, hypertension, who had COVID-19 infection over 4 weeks ago from which he states he had completely resolved, who presents with a 4-day history of progressively worsening shortness of breath. Denies chest pain and denies lower extremity pain or swelling. He denies having a cough or fever. Denies nausea vomiting or diaphoresis, denies abdominal pain or change in bowel habits. On arrival in the emergency room his O2 sat was 68% on room air, improving to 94% on 4 L. Temperature was 99 with blood pressure 157/62 heart rate 83 and respirations 18/min. His chest x-ray showed multifocal airspace opacities with consolidation of the left midlung. On his blood work BNP was elevated at 1167. EKG showed no acute ST-T wave changes. Other blood work remarkable for creatinine of 3.88 up from his baseline of 2.87, blood sugar of 233, hemoglobin of 9.1, down from baseline of 11.7 and white cell count of 12.5. Baseline labs were obtained from review of care everywhere from [facility] where patient gets most of his care. RepeatCovid test in the ER was negative. She was started on Rocephin and azithromycin and hospitalization requested. Status discussed and patient elects to be DNR,was confirmed with son on phone.    Consultants:   Cardiology, nephrology  Procedures:  Echo 12/24 1. Left ventricular ejection fraction, by visual estimation, is 50 to 55%. The left ventricle has normal function. There is no left ventricular hypertrophy.  2. Left ventricular diastolic parameters are consistent with Grade II diastolic dysfunction (pseudonormalization).  3. The left ventricle has no regional  wall motion abnormalities.  4. Global right ventricle has normal systolic function.The right ventricular size is normal. No increase in right ventricular wall thickness.  5. Left atrial size was mildly dilated.  6. Moderate mitral valve regurgitation.  7. TR signal is inadequate for assessing pulmonary artery systolic pressure.  Antimicrobials:   Azithromycin and ceftriaxone    Subjective: Patient blood glucose levels much better.  His breathing he reports is better.  No chest pain.  No new complaints   Objective: Vitals:   06/22/19 2130 06/23/19 0452 06/23/19 0929 06/23/19 1123  BP: (!) 143/68 (!) 153/90 (!) 149/66 (!) 151/62  Pulse: 73 70 70 66  Resp: 20 20  16   Temp: 98.2 F (36.8 C) 97.7 F (36.5 C)  (!) 97.5 F (36.4 C)  TempSrc: Oral Oral  Oral  SpO2: 93% 93% 94% (!) 85%  Weight:      Height:        Intake/Output Summary (Last 24 hours) at 06/23/2019 1350 Last data filed at 06/23/2019 0940 Gross per 24 hour  Intake 810.02 ml  Output 500 ml  Net 310.02 ml   Filed Weights   06/21/19 0150  Weight: 88.5 kg    Examination: General exam: Appears calm and comfortable, NAD, eating breakfast, nontachypneic Respiratory system: rhonchorus, no wheezing or rales Cardiovascular system: S1 & S2 heard, RRR.  No murmurs, rubs, gallops. Gastrointestinal system: Abdomen is mild distention, soft and nontender. Normal bowel sounds heard. No rebound Central nervous system: Alert and oriented. No focal neurological deficits. Extremities: bilateral lower extremity edema up to lower thighs, with chronic skin changes +UE edema b/l Skin: Warm dry Psychiatry: Judgement and  insight appear normal. Mood & affect appropriate.      Data Reviewed: I have personally reviewed following labs and imaging studies  CBC: Recent Labs  Lab 06/21/19 0154 06/22/19 0506 06/23/19 0630  WBC 12.5* 13.3* 19.4*  NEUTROABS 10.2*  --   --   HGB 9.1* 8.6* 9.1*  HCT 28.3* 26.4* 26.8*  MCV 90.4  90.1 87.0  PLT 349 350 [id number]   Basic Metabolic Panel: Recent Labs  Lab 06/21/19 0154 06/22/19 0506 06/23/19 0317  NA 139 132* 136  K 4.8 4.9 4.5  CL 104 100 102  CO2 21* 22 18*  GLUCOSE 233* 417* 118*  BUN 55* 82* 90*  CREATININE 3.88* 4.53* 4.62*  CALCIUM 8.7* 8.7* 8.4*   GFR: Estimated Creatinine Clearance: 17 mL/min (A) (by C-G formula based on SCr of 4.62 mg/dL (H)). Liver Function Tests: No results for input(s): AST, ALT, ALKPHOS, BILITOT, PROT, ALBUMIN in the last 168 hours. No results for input(s): LIPASE, AMYLASE in the last 168 hours. No results for input(s): AMMONIA in the last 168 hours. Coagulation Profile: Recent Labs  Lab 06/21/19 0155  INR 1.0   Cardiac Enzymes: No results for input(s): CKTOTAL, CKMB, CKMBINDEX, TROPONINI in the last 168 hours. BNP (last 3 results) No results for input(s): PROBNP in the last 8760 hours. HbA1C: Recent Labs    06/21/19 0412  HGBA1C 6.8*   CBG: Recent Labs  Lab 06/22/19 1611 06/22/19 1706 06/22/19 2131 06/23/19 0738 06/23/19 1121  GLUCAP 341* 321* 250* 99 244*   Lipid Profile: Recent Labs    06/22/19 0506  CHOL 167  HDL 48  LDLCALC 104*  TRIG 77  CHOLHDL 3.5   Thyroid Function Tests: No results for input(s): TSH, T4TOTAL, FREET4, T3FREE, THYROIDAB in the last 72 hours. Anemia Panel: No results for input(s): VITAMINB12, FOLATE, FERRITIN, TIBC, IRON, RETICCTPCT in the last 72 hours. Sepsis Labs: Recent Labs  Lab 06/21/19 0155 06/21/19 0412 06/21/19 0433  PROCALCITON  --   --  0.30  LATICACIDVEN 1.4 0.8  --     Recent Results (from the past 240 hour(s))  Blood Culture (routine x 2)     Status: None (Preliminary result)   Collection Time: 06/21/19  2:08 AM   Specimen: BLOOD  Result Value Ref Range Status   Specimen Description BLOOD BLOOD RIGHT FOREARM  Final   Special Requests   Final    BOTTLES DRAWN AEROBIC AND ANAEROBIC Blood Culture adequate volume   Culture   Final    NO GROWTH 2  DAYS Performed at [facility], [address], [place], [place] [address]    Report Status PENDING  Incomplete  Blood Culture (routine x 2)     Status: None (Preliminary result)   Collection Time: 06/21/19  2:13 AM   Specimen: BLOOD  Result Value Ref Range Status   Specimen Description BLOOD LEFT ANTECUBITAL  Final   Special Requests   Final    BOTTLES DRAWN AEROBIC AND ANAEROBIC Blood Culture results may not be optimal due to an inadequate volume of blood received in culture bottles   Culture   Final    NO GROWTH 2 DAYS Performed at [facility], [address] [place], [place] [address]    Report Status PENDING  Incomplete  Respiratory Panel by PCR     Status: None   Collection Time: 06/21/19 11:56 AM   Specimen: Nasopharyngeal Swab; Respiratory  Result Value Ref Range Status   Adenovirus NOT DETECTED NOT DETECTED Final  Coronavirus 229E NOT DETECTED NOT DETECTED Final    Comment: (NOTE) The Coronavirus on the Respiratory Panel, DOES NOT test for the novel  Coronavirus (2019 nCoV)    Coronavirus HKU1 NOT DETECTED NOT DETECTED Final   Coronavirus NL63 NOT DETECTED NOT DETECTED Final   Coronavirus OC43 NOT DETECTED NOT DETECTED Final   Metapneumovirus NOT DETECTED NOT DETECTED Final   Rhinovirus / Enterovirus NOT DETECTED NOT DETECTED Final   Influenza A NOT DETECTED NOT DETECTED Final   Influenza B NOT DETECTED NOT DETECTED Final   Parainfluenza Virus 1 NOT DETECTED NOT DETECTED Final   Parainfluenza Virus 2 NOT DETECTED NOT DETECTED Final   Parainfluenza Virus 3 NOT DETECTED NOT DETECTED Final   Parainfluenza Virus 4 NOT DETECTED NOT DETECTED Final   Respiratory Syncytial Virus NOT DETECTED NOT DETECTED Final   Bordetella pertussis NOT DETECTED NOT DETECTED Final   Chlamydophila pneumoniae NOT DETECTED NOT DETECTED Final   Mycoplasma pneumoniae NOT DETECTED NOT DETECTED Final    Comment: Performed at [facility], [place] [address]  [place], [place] [address]         Radiology Studies: No results found.      Scheduled Meds: . albuterol  2 puff Inhalation Q6H  . amLODipine  10 mg Oral Daily  . aspirin EC  81 mg Oral Daily  . atorvastatin  80 mg Oral Daily  . azithromycin  500 mg Oral Daily  . carvedilol  25 mg Oral BID  . Chlorhexidine Gluconate Cloth  6 each Topical Daily  . DULoxetine  30 mg Oral Daily  . insulin aspart  0-20 Units Subcutaneous TID WC  . insulin aspart  0-5 Units Subcutaneous QHS  . insulin aspart  4 Units Subcutaneous TID WC  . insulin detemir  25 Units Subcutaneous QHS  . levothyroxine  100 mcg Oral Daily  . pentoxifylline  400 mg Oral TID  . tuberculin  5 Units Intradermal Once   Continuous Infusions: . cefTRIAXone (ROCEPHIN)  IV Stopped (06/22/19 1801)  . heparin 1,550 Units/hr (06/23/19 1250)    Assessment & Plan:   Principal Problem:   Acute respiratory failure with hypoxia (HCC) Active Problems:   CAP (community acquired pneumonia)   Venous ulcer of left leg (HCC)   COPD with chronic bronchitis (HCC)   CKD (chronic kidney disease) stage 4, GFR 15-29 ml/min (HCC)   AKI (acute kidney injury) ([place])   Chronic systolic CHF (congestive heart failure) ([place])   History of 2019 novel coronavirus disease (COVID-19)   Anemia   Acute respiratory failure (HCC)   Hyperglycemia due to type 1 diabetes mellitus (HCC)   Hx of CABG   NSTEMI (non-ST elevated myocardial infarction) ([place])   NSTEMI --In setting of acute respiratory distress and pneumonia  elevated troponin Echo with EF of 50 to 55%, grossly no wall motion abnormality Cardiology following -recommend continuing heparin until tomorrow to finish 48 hours.   Needs ischemic w/u however not cardiac cath as renal function worsening at this time. Maybe after HD starts or pharmacologic myoview Continue with beta-blockers and aspirin, statin   Acute respiratory failure with hypoxia (HCC) --Most likely related to  community-acquired pneumonia and CHF exacerbation. --O2 sat was 68% on room air on arrival improving to 94% on 4 L patient has no prior history of home oxygen use --Supplemental oxygen to keep sats over 90% Stopped Decadron as he does not require at this time Continue continue IV antibiotics Continue IV lasix, getting HD today Try  to wean off 02 if able to.  Community acquired pneumonia in the setting of recent history of COVID-19 infection about 4 weeks ago-- --Patient has no prior history of supplemental oxygen use ---Chest x-ray showing multifocal pneumonia with consolidative opacity in the left midlung --Repeat Covid test /respiratory panel negative --IV Rocephin and azithromycin for 5 to 7 days --Albuterol every 6 hours and as needed --Supplemental oxygen to keep sats over 90% --Blood cultures pending.  -respiratory panel negative  Acute onChronic Diastolic and systolic CHF (congestive heart failure) ([place]) --Patient followed at [facility].Last EF on file was 50%on 3/2020improved from 40% Echo here EF 50-55% with Grade II diastolic dysfunction. -- Chest x-ray shows small bilateral pleural effusion --Appears volume overloaded, on Lasix IV, will continue. Will discuss with nephrology as renal failure worsened. Likely renal Daily weights with salt and fluid restriction   Anemia,uncertain acuity-likely ACKD --Hemoglobin 9.1. Most hemoglobin on file on chart everywhere was 11.7 --Suspect related to worsening kidney function --Stool for occult blood to evaluate for GI etiology-pending --Monitor H&H  Hyperglycemia due to type 1 diabetes mellitus (HCC) Decadron stopped Now BG better Increase Lantus to 25Units . Decreased novolog to 4u with meals carb control  Diet with low sodium HA1C 6.8   Venous ulcer of left leg (HCC) --Recently seen by PCP on 06/19/2019 --Daily wound care  COPD with chronic bronchitis ([place]) --Does not appear to be acutely  exacerbated --Albuterol prn   Acute kidney injury superimposed onCKD (chronic kidney disease) stage 4, GFR 15-29 ml/min (HCC) with metabolic acidosis --BUN creatinine of55/3.88,most recent creatinine of 2.85 in June 2020 per Care Everywhere Now worsening --Nephrology following-on IV Lasix .at this point he needs temporary dialysis and will plan for this afternoon for first dialysis  Dialysis catheter placement   history ofCABG --Patient has history of CABG in 2005 -Continue with aspirin, statin, beta-blockers     DVT prophylaxis Heparin Code Status: DNR Family Communication: None at bedside Disposition Plan: We will need PT OT. Will likely be here 2 MN days as he is not medically stable.     LOS: 2 days   Time spent: 45 minutes with more than 50% COC    Nolberto Hanlon, MD [facility] Pager [phone]  If 7PM-7AM, please contact night-coverage [email or web address] Password [email or web address] 06/23/2019, 1:50 PM Patient ID: [id number] LUNDRIGAN, male   DOB: 07/03/1950, 68 y.o.   MRN: [id number]

## 2019-06-23 NOTE — Progress Notes (Addendum)
Patient returned from HD. Patient AOx4, denies any pain or SOB. Current BP 160/68, HR 77,O2 Sats 94% on 7L [place]. Per HD nurse, O2 was increased to 7L [place] to maintain O2 Sats at 95% during HD treatment. Respiratory at the bedside. Per RT patient can be decreased to 5L [place]. Encouraged patient to TCDB. Per RT maintain O2 Sats above 88%. Sats on 5L at 94%. Patient reminded to rest tonight and ask for assistance to and from the [facility]. Bed alarm on.   Fuller Mandril, RN

## 2019-06-23 NOTE — Progress Notes (Signed)
Pre HD Tx note Pt arrived from 228 to receive new hD tx. New Start BFR 200DFR 300 2hours on 3K2.5Ca. Pt A*4. Currently receiving 3LO2 per [place]. CVC WDL Pt is ready for dialysis  06/23/19 1530  Hand-Off documentation  Report given to (Full Name) Newt Minion Rn   Report received from (Full Name) Mitsy Burns RN   Vital Signs  Temp 98.3 F (36.8 C)  Temp Source Oral  Pulse Rate 66  Pulse Rate Source Monitor  Resp 18  BP (!) 166/62  BP Location Left Arm  BP Method Automatic  Patient Position (if appropriate) Lying  Oxygen Therapy  SpO2 94 %  O2 Device Nasal Cannula  O2 Flow Rate (L/min) 3 L/min  Patient Activity (if Appropriate) In bed  Pulse Oximetry Type Continuous  Pain Assessment  Pain Scale 0-10  Pain Score 0  Dialysis Weight  Weight 89.5 kg  Type of Weight Pre-Dialysis  Time-Out for Hemodialysis  What Procedure? HD  Pt Identifiers(min of two) MRN/Account#;First/Last Name  Correct Site? Yes  Correct Side? Yes  Correct Procedure? Yes  Consents Verified? Yes  Safety Precautions Reviewed? Yes  Engineer, civil (consulting) Number 7  Station Number 1  UF/Alarm Test Passed  Conductivity: Meter 14  Conductivity: Machine  14  pH 7.2  Reverse Osmosis Main  Normal Saline Lot Number [id number]  Dialyzer Lot Number [id number]  Disposable Set Lot Number [id number]  Machine Temperature 98.6 F (37 C)  Musician and Audible Yes  Blood Lines Intact and Secured Yes  Pre Treatment Patient Checks  Vascular access used during treatment Catheter  HD catheter dressing before treatment WDL  Patient is receiving dialysis in a chair  (in bed)  Hepatitis B Surface Antigen Results Pending  Isolation Initiated Yes  Hepatitis B Surface Antibody  (Pending)  Date Hepatitis B Surface Antibody Drawn 06/23/19  Hemodialysis Consent Verified Yes  Hemodialysis Standing Orders Initiated Yes  ECG (Telemetry) Monitor On Yes  Prime Ordered Normal Saline  Length of  DialysisTreatment -hour(s) 2  Hour(s)  Dialysis Treatment Comments  (Na140)  Dialyzer Elisio 17H NR  Dialysate 3K;2.5 Ca  Dialysis Anticoagulant None  Dialysate Flow Ordered 300  Blood Flow Rate Ordered 200 mL/min  Ultrafiltration Goal 1 Liters  Pre Treatment Labs Hepatitis B Surface Antigen  Dialysis Blood Pressure Support Ordered Albumin  Education / Care Plan  Dialysis Education Provided Yes  Documented Education in Care Plan Yes  Hemodialysis Catheter Right Femoral vein Triple lumen Temporary (Non-Tunneled)  Placement Date/Time: 06/23/19 1130   Placed prior to admission: No  Time Out: Correct patient;Correct site;Correct procedure  Maximum sterile barrier precautions: Hand hygiene;Cap;Mask;Sterile gown;Sterile gloves;Large sterile sheet  Site Prep: Chlorh...  Site Condition No complications  Blue Lumen Status Flushed;Blood return noted;Infusing  Red Lumen Status Infusing;Flushed;Blood return noted  Purple Lumen Status Capped (Central line)  Catheter fill solution Heparin 1000 units/ml  Catheter fill volume (Arterial) 1.8 cc  Catheter fill volume (Venous) 1.8  Dressing Type Biopatch  Dressing Status Clean;Dry;Intact  Interventions Dressing reinforced  Drainage Description None  Dressing Change Due 06/24/19

## 2019-06-23 NOTE — Progress Notes (Signed)
Post HD Tx note Pt tolerated well the tx. 1527ml as prescribed goal of UF was removed. Pt noted to use accessory muscles while breathing. O2 increased to 7L SPO2 95%. Primary RN informed and will request RRT at bedside. Pt Tx run for 2 hrs on 3K2.5Ca. CVC is patent, heparin locked, closed and clammed   06/23/19 1800  Vital Signs  Pulse Rate 71  Resp 20  BP (!) 159/75  Oxygen Therapy  SpO2 95 %  O2 Device Nasal Cannula  O2 Flow Rate (L/min) 7 L/min  Pain Assessment  Pain Scale 0-10  Pain Score 0  Post-Hemodialysis Assessment  Rinseback Volume (mL) 250 mL  KECN 25 V  Dialyzer Clearance Lightly streaked  Duration of HD Treatment -hour(s) 2 hour(s)  Hemodialysis Intake (mL) 500 mL  UF Total -Machine (mL) 1511 mL  Net UF (mL) 1011 mL  Tolerated HD Treatment Yes  Hemodialysis Catheter Right Femoral vein Triple lumen Temporary (Non-Tunneled)  Placement Date/Time: 06/23/19 1130   Placed prior to admission: No  Time Out: Correct patient;Correct site;Correct procedure  Maximum sterile barrier precautions: Hand hygiene;Cap;Mask;Sterile gown;Sterile gloves;Large sterile sheet  Site Prep: Chlorh...  Site Condition No complications  Blue Lumen Status Heparin locked  Red Lumen Status Heparin locked  Purple Lumen Status Capped (Central line)  Catheter fill solution Heparin 1000 units/ml  Catheter fill volume (Arterial) 1.8 cc  Catheter fill volume (Venous) 1.8  Dressing Type Gauze/Drain sponge  Dressing Status Clean;Dry;Intact  Interventions Dressing reinforced  Drainage Description None  Dressing Change Due 06/24/19  Post treatment catheter status Capped and Clamped

## 2019-06-23 NOTE — Progress Notes (Signed)
HD Tx Started Pt continues to receive heparin   06/23/19 1540  Vital Signs  Pulse Rate 66  Pulse Rate Source Monitor  Resp 18  BP (!) 166/62  BP Location Left Arm  BP Method Automatic  Patient Position (if appropriate) Lying  Oxygen Therapy  SpO2 94 %  O2 Device Nasal Cannula  O2 Flow Rate (L/min) 3 L/min  Pain Assessment  Pain Scale 0-10  Pain Score 0  During Hemodialysis Assessment  Blood Flow Rate (mL/min) 200 mL/min  Arterial Pressure (mmHg) -50 mmHg  Venous Pressure (mmHg) 80 mmHg  Transmembrane Pressure (mmHg) 40 mmHg  Ultrafiltration Rate (mL/min) 770 mL/min  Dialysate Flow Rate (mL/min) 300 ml/min  Conductivity: Machine  14  HD Safety Checks Performed Yes  Dialysis Fluid Bolus Normal Saline  Bolus Amount (mL) 250 mL  Intra-Hemodialysis Comments Tx initiated;Progressing as prescribed

## 2019-06-23 NOTE — Progress Notes (Signed)
Pre HD Tx Assessment   06/23/19 1530  Neurological  Level of Consciousness Alert  Orientation Level Oriented X4  Respiratory  Respiratory Pattern Regular;Unlabored  Chest Assessment Chest expansion symmetrical  R Upper  Breath Sounds Inspiratory wheezes  L Upper Breath Sounds Inspiratory wheezes  R Lower Breath Sounds Diminished;Fine crackles  L Lower Breath Sounds Diminished;Fine crackles  Cough Non-productive  Cardiac  Pulse Regular  Jugular Venous Distention (JVD) No  ECG Monitor Yes  Antiarrhythmic device No  Vascular  R Radial Pulse +2  L Radial Pulse +2  Edema Right upper extremity;Left upper extremity;Right lower extremity;Left lower extremity  RUE Edema +1  LUE Edema +1  RLE Edema +1  LLE Edema +1  Integumentary  Integumentary (WDL) X  Skin Color Other (Comment);Appropriate for ethnicity (lower extremities brownish coloration )  Skin Condition Dry;Flaky  Skin Integrity Cracking  Cracking Location Foot  Cracking Location Orientation Right;Left;Posterior  Musculoskeletal  Musculoskeletal (WDL) WDL  Gastrointestinal  Bowel Sounds Assessment Hypoactive  Last BM Date 06/22/19  GU Assessment  Genitourinary (WDL) WDL  Psychosocial  Psychosocial (WDL) WDL

## 2019-06-23 NOTE — Progress Notes (Signed)
ANTICOAGULATION CONSULT NOTE  Pharmacy Consult for Heparin Indication: chest pain/ACS  Patient Measurements: Height: 5\' 8"  (172.7 cm) Weight: 195 lb (88.5 kg) IBW/kg (Calculated) : 68.4 HEPARIN DW (KG): 86.4  Vital Signs: Temp: 98.2 F (36.8 C) (12/25 2130) Temp Source: Oral (12/25 2130) BP: 143/68 (12/25 2130) Pulse Rate: 73 (12/25 2130)  Labs: Recent Labs    06/21/19 0154 06/21/19 0155 06/21/19 0156 06/21/19 0412 06/21/19 0433 06/21/19 1439 06/22/19 0506 06/22/19 0752 06/22/19 1910 06/23/19 0317  HGB 9.1*  --   --   --   --   --  8.6*  --   --   --   HCT 28.3*  --   --   --   --   --  26.4*  --   --   --   PLT 349  --   --   --   --   --  350  --   --   --   APTT  --   --   --   --   --  52*  --   --   --   --   LABPROT  --  13.1  --   --   --   --   --   --   --   --   INR  --  1.0  --   --   --   --   --   --   --   --   HEPARINUNFRC  --   --    < >  --   --  0.12*  --  0.17* 0.49 0.28*  CREATININE 3.88*  --   --   --   --   --  4.53*  --   --  4.62*  TROPONINIHS  --   --   --  2,780* 5,300* 8,795*  --   --   --   --    < > = values in this interval not displayed.    Estimated Creatinine Clearance: 17 mL/min (A) (by C-G formula based on SCr of 4.62 mg/dL (H)).   Medical History: Past Medical History:  Diagnosis Date  . CAD (coronary artery disease)   . CKD (chronic kidney disease), stage III   . COPD (chronic obstructive pulmonary disease) ([place])   . COVID-19   . Diabetes mellitus without complication ([place])   . HFrEF (heart failure with reduced ejection fraction) ([place])   . LBBB (left bundle branch block)   . Venous ulcer [facility])     Assessment: 68 y.o. male with a hx of CAD s/p 4-vessel CABG with LIMA-LAD, SVG-diagonal, SVG to OM, SVG to RCA with non-STEMI in 07/2015 status post PCI/DES. Pharmacy asked to initiate and monitor Heparin for ACS.  No anticoagulants except aspirin and clopidogrel PTA per current med list. HS troponin rising; Baseline labs: INR  1.0, H&H: 9.1/28.3, PLT wnl  Heparin Course: 12/24 4000 unit bolus, then 1000 units/hr 12/24 1439 aPTT 52s, HL 0.12 12/25 0016 HL 0.32, therapeutic x 1 12/25 0752 HL 0.17, subtherapeutic. Will bolus 1200 units and increase rate to 1450 units/hr.  12/25 1910 HL 0.49, therapeutic 12/26 0317 HL 0.28, subtherapeutic but only barely below goal  Goal of Therapy:  Heparin level 0.3-0.7 units/ml Monitor platelets by anticoagulation protocol: Yes   Plan:  Heparin level subtherapeutic:  Will increase Heparin infusion to 1550 units/hr.  Check HL in 8 hours. Daily CBC.  Ena Dawley, PharmD 06/23/2019,4:00 AM

## 2019-06-23 NOTE — Progress Notes (Signed)
[facility]  ROUNDING NOTE   Subjective:   UOP 200 (439mL)   Creatinine 4.62 (4.53) (3.88)  Furosemide 80mg  IV q12.   Complains of shortness of breath.   Objective:  Vital signs in last 24 hours:  Temp:  [97.7 F (36.5 C)-98.2 F (36.8 C)] 97.7 F (36.5 C) (12/26 0452) Pulse Rate:  [70-73] 70 (12/26 0929) Resp:  [20] 20 (12/26 0452) BP: (143-153)/(66-90) 149/66 (12/26 0929) SpO2:  [93 %-94 %] 94 % (12/26 0929)  Weight change:  Filed Weights   06/21/19 0150  Weight: 88.5 kg    Intake/Output: I/O last 3 completed shifts: In: 1138 [P.O.:180; I.V.:508; IV Piggyback:450] Out: 600 [Urine:600]   Intake/Output this shift:  Total I/O In: 315.2 [P.O.:240; I.V.:75.2] Out: 300 [Urine:300]  Physical Exam: General: NAD, sitting up in bed  Head: Normocephalic, atraumatic. Moist oral mucosal membranes  Eyes: Anicteric, PERRL  Neck: Supple, trachea midline  Lungs:  crackles  Heart: Regular rate and rhythm  Abdomen:  Soft, nontender,   Extremities:  + peripheral edema.  Neurologic: Nonfocal, moving all four extremities  Skin: Left lower extremity ulcer with dressings        Basic Metabolic Panel: Recent Labs  Lab 06/21/19 0154 06/22/19 0506 06/23/19 0317  NA 139 132* 136  K 4.8 4.9 4.5  CL 104 100 102  CO2 21* 22 18*  GLUCOSE 233* 417* 118*  BUN 55* 82* 90*  CREATININE 3.88* 4.53* 4.62*  CALCIUM 8.7* 8.7* 8.4*    Liver Function Tests: No results for input(s): AST, ALT, ALKPHOS, BILITOT, PROT, ALBUMIN in the last 168 hours. No results for input(s): LIPASE, AMYLASE in the last 168 hours. No results for input(s): AMMONIA in the last 168 hours.  CBC: Recent Labs  Lab 06/21/19 0154 06/22/19 0506 06/23/19 0630  WBC 12.5* 13.3* 19.4*  NEUTROABS 10.2*  --   --   HGB 9.1* 8.6* 9.1*  HCT 28.3* 26.4* 26.8*  MCV 90.4 90.1 87.0  PLT 349 350 375    Cardiac Enzymes: No results for input(s): CKTOTAL, CKMB, CKMBINDEX, TROPONINI in the last 168  hours.  BNP: Invalid input(s): POCBNP  CBG: Recent Labs  Lab 06/22/19 1409 06/22/19 1611 06/22/19 1706 06/22/19 2131 06/23/19 0738  GLUCAP 418* 341* 321* 250* 99    Microbiology: Results for orders placed or performed during the hospital encounter of 06/21/19  Blood Culture (routine x 2)     Status: None (Preliminary result)   Collection Time: 06/21/19  2:08 AM   Specimen: BLOOD  Result Value Ref Range Status   Specimen Description BLOOD BLOOD RIGHT FOREARM  Final   Special Requests   Final    BOTTLES DRAWN AEROBIC AND ANAEROBIC Blood Culture adequate volume   Culture   Final    NO GROWTH 2 DAYS Performed at [facility], [address] [place], [place] [address]    Report Status PENDING  Incomplete  Blood Culture (routine x 2)     Status: None (Preliminary result)   Collection Time: 06/21/19  2:13 AM   Specimen: BLOOD  Result Value Ref Range Status   Specimen Description BLOOD LEFT ANTECUBITAL  Final   Special Requests   Final    BOTTLES DRAWN AEROBIC AND ANAEROBIC Blood Culture results may not be optimal due to an inadequate volume of blood received in culture bottles   Culture   Final    NO GROWTH 2 DAYS Performed at [facility], [address], [place], [place] [address]  Report Status PENDING  Incomplete  Respiratory Panel by PCR     Status: None   Collection Time: 06/21/19 11:56 AM   Specimen: Nasopharyngeal Swab; Respiratory  Result Value Ref Range Status   Adenovirus NOT DETECTED NOT DETECTED Final   Coronavirus 229E NOT DETECTED NOT DETECTED Final    Comment: (NOTE) The Coronavirus on the Respiratory Panel, DOES NOT test for the novel  Coronavirus (2019 nCoV)    Coronavirus HKU1 NOT DETECTED NOT DETECTED Final   Coronavirus NL63 NOT DETECTED NOT DETECTED Final   Coronavirus OC43 NOT DETECTED NOT DETECTED Final   Metapneumovirus NOT DETECTED NOT DETECTED Final   Rhinovirus / Enterovirus NOT DETECTED NOT DETECTED Final    Influenza A NOT DETECTED NOT DETECTED Final   Influenza B NOT DETECTED NOT DETECTED Final   Parainfluenza Virus 1 NOT DETECTED NOT DETECTED Final   Parainfluenza Virus 2 NOT DETECTED NOT DETECTED Final   Parainfluenza Virus 3 NOT DETECTED NOT DETECTED Final   Parainfluenza Virus 4 NOT DETECTED NOT DETECTED Final   Respiratory Syncytial Virus NOT DETECTED NOT DETECTED Final   Bordetella pertussis NOT DETECTED NOT DETECTED Final   Chlamydophila pneumoniae NOT DETECTED NOT DETECTED Final   Mycoplasma pneumoniae NOT DETECTED NOT DETECTED Final    Comment: Performed at [facility], [place]. [address], [place], [place] [address]    Coagulation Studies: Recent Labs    06/21/19 0155  LABPROT 13.1  INR 1.0    Urinalysis: No results for input(s): COLORURINE, LABSPEC, PHURINE, GLUCOSEU, HGBUR, BILIRUBINUR, KETONESUR, PROTEINUR, UROBILINOGEN, NITRITE, LEUKOCYTESUR in the last 72 hours.  Invalid input(s): APPERANCEUR    Imaging: US RENAL  Result Date: 06/21/2019 CLINICAL DATA:  Acute kidney injury. Stage 4 chronic kidney disease. EXAM: RENAL / URINARY TRACT ULTRASOUND COMPLETE COMPARISON:  Chest CTA dated 10/27/2013. FINDINGS: Right Kidney: Renal measurements: 9.5 x 4.6 x 4.6 cm = volume: 106 mL. Mildly echogenic. No hydronephrosis. Diffuse cortical thinning with prominent renal sinus fat. Left Kidney: Renal measurements: 8.7 x 4.6 x 4.5 cm = volume: 94 mL. Borderline echogenic. No hydronephrosis. Bladder: Appears normal for degree of bladder distention. Other: None. IMPRESSION: 1. Mildly echogenic right kidney and borderline echogenic left kidney, compatible with medical renal disease. 2. No hydronephrosis. 3. Bilateral renal atrophy, greater on the right. Electronically Signed   By: Claudie Revering M.D.   On: 06/21/2019 11:56   ECHOCARDIOGRAM COMPLETE  Result Date: 06/21/2019   ECHOCARDIOGRAM REPORT   Patient Name:   Bryce Garcia Date of Exam: 06/21/2019 Medical Rec #:  [id number]       Height:       68.0 in Accession #:    [id number]     Weight:       195.0 lb Date of Birth:  12-02-1950      BSA:          2.02 m Patient Age:    33 years       BP:           139/65 mmHg Patient Gender: M              HR:           72 bpm. Exam Location:  [facility] Procedure: 2D Echo, Cardiac Doppler and Color Doppler Indications:     NSTEMI I21.4  History:         Patient has no prior history of Echocardiogram examinations.  CAD; Arrythmias:LBBB.  Sonographer:     Alyse Low Roar Referring Phys:  [id number] Athena Masse Diagnosing Phys: Ida Rogue MD IMPRESSIONS  1. Left ventricular ejection fraction, by visual estimation, is 50 to 55%. The left ventricle has normal function. There is no left ventricular hypertrophy.  2. Left ventricular diastolic parameters are consistent with Grade II diastolic dysfunction (pseudonormalization).  3. The left ventricle has no regional wall motion abnormalities.  4. Global right ventricle has normal systolic function.The right ventricular size is normal. No increase in right ventricular wall thickness.  5. Left atrial size was mildly dilated.  6. Moderate mitral valve regurgitation.  7. TR signal is inadequate for assessing pulmonary artery systolic pressure. FINDINGS  Left Ventricle: Left ventricular ejection fraction, by visual estimation, is 50 to 55%. The left ventricle has normal function. The left ventricle has no regional wall motion abnormalities. There is no left ventricular hypertrophy. Left ventricular diastolic parameters are consistent with Grade II diastolic dysfunction (pseudonormalization). Normal left atrial pressure. Right Ventricle: The right ventricular size is normal. No increase in right ventricular wall thickness. Global RV systolic function is has normal systolic function. Left Atrium: Left atrial size was mildly dilated. Right Atrium: Right atrial size was normal in size Pericardium: There is no evidence of pericardial effusion. Mitral Valve: The  mitral valve is normal in structure. Moderate mitral valve regurgitation. No evidence of mitral valve stenosis by observation. Tricuspid Valve: The tricuspid valve is normal in structure. Tricuspid valve regurgitation is not demonstrated. Aortic Valve: The aortic valve is normal in structure. Aortic valve regurgitation is not visualized. Mild to moderate aortic valve sclerosis/calcification is present, without any evidence of aortic stenosis. Aortic valve mean gradient measures 3.0 mmHg. Aortic valve peak gradient measures 5.9 mmHg. Aortic valve area, by VTI measures 2.43 cm. Pulmonic Valve: The pulmonic valve was normal in structure. Pulmonic valve regurgitation is not visualized. Pulmonic regurgitation is not visualized. Aorta: The aortic root, ascending aorta and aortic arch are all structurally normal, with no evidence of dilitation or obstruction. Venous: The inferior vena cava is dilated in size with greater than 50% respiratory variability, suggesting right atrial pressure of 8 mmHg. IAS/Shunts: No atrial level shunt detected by color flow Doppler. There is no evidence of a patent foramen ovale. No ventricular septal defect is seen or detected. There is no evidence of an atrial septal defect.  LEFT VENTRICLE PLAX 2D LVIDd:         4.72 cm  Diastology LVIDs:         3.81 cm  LV e' lateral:   10.40 cm/s LV PW:         1.15 cm  LV E/e' lateral: 10.8 LV IVS:        1.02 cm  LV e' medial:    4.82 cm/s LVOT diam:     2.00 cm  LV E/e' medial:  23.2 LV SV:         41 ml LV SV Index:   19.70 LVOT Area:     3.14 cm  RIGHT VENTRICLE RV Basal diam:  2.89 cm LEFT ATRIUM            Index       RIGHT ATRIUM           Index LA diam:      4.50 cm  2.23 cm/m  RA Area:     16.60 cm LA Vol (A2C): 77.8 ml  38.48 ml/m RA Volume:   45.40 ml  22.45  ml/m LA Vol (A4C): 106.0 ml 52.43 ml/m  AORTIC VALVE                   PULMONIC VALVE AV Area (Vmax):    2.08 cm    PV Vmax:        0.98 m/s AV Area (Vmean):   2.49 cm    PV Peak  grad:   3.8 mmHg AV Area (VTI):     2.43 cm    RVOT Peak grad: 1 mmHg AV Vmax:           121.00 cm/s AV Vmean:          76.100 cm/s AV VTI:            0.226 m AV Peak Grad:      5.9 mmHg AV Mean Grad:      3.0 mmHg LVOT Vmax:         80.00 cm/s LVOT Vmean:        60.300 cm/s LVOT VTI:          0.175 m LVOT/AV VTI ratio: 0.77  AORTA Ao Root diam: 2.90 cm MITRAL VALVE MV Area (PHT): 3.60 cm              SHUNTS MV PHT:        61.19 msec            Systemic VTI:  0.18 m MV Decel Time: 211 msec              Systemic Diam: 2.00 cm MV E velocity: 112.00 cm/s 103 cm/s MV A velocity: 72.20 cm/s  70.3 cm/s MV E/A ratio:  1.55        1.5  Ida Rogue MD Electronically signed by Ida Rogue MD Signature Date/Time: 06/21/2019/2:10:13 PM    Final      Medications:   . cefTRIAXone (ROCEPHIN)  IV Stopped (06/22/19 1801)  . heparin 1,550 Units/hr (06/23/19 0940)   . albuterol  2 puff Inhalation Q6H  . amLODipine  10 mg Oral Daily  . aspirin EC  81 mg Oral Daily  . atorvastatin  80 mg Oral Daily  . azithromycin  500 mg Oral Daily  . carvedilol  25 mg Oral BID  . DULoxetine  30 mg Oral Daily  . furosemide  80 mg Intravenous BID  . insulin aspart  0-20 Units Subcutaneous TID WC  . insulin aspart  0-5 Units Subcutaneous QHS  . insulin aspart  4 Units Subcutaneous TID WC  . insulin detemir  25 Units Subcutaneous QHS  . levothyroxine  100 mcg Oral Daily  . pentoxifylline  400 mg Oral TID   alum & mag hydroxide-simeth, ipratropium-albuterol, traMADol  Assessment/ Plan:  [name] is a 68 y.o. white male with insulin dependent diabetes mellitus type II, hypertension, coronary artery disease status post CABG, COPD, COVID-19 infection 04/2019, chronic venous stasis ulcer, , who was admitted to [facility] on 06/21/2019 for acute exacerbation of systolic congestive heart failure. Echocardiogram 3/8 at [facility] with EF of 50%.   1. Acute kidney failure with metabolic acidosis and hyponatremia on chronic kidney  disease stage IV with proteinuria: baseline creatinine of 2.85, GFR of 22 on 12/18/18.  Chronic kidney disease secondary to diabetic nephropathy. Followed by [facility], Dr. Alfonse Alpers Acute kidney injury secondary to acute cardiorenal syndrome. Not on an ACE-I/ARB at home.  - Patient will need temporary dialysis. Consult vascular for catheter placement. Discussed dialysis with patient. He is agreeable  to dialysis.   2. Hypertension with acute exacerbation of diastolic congestive heart failure.   3. Anemia with chronic kidney disease: normocytic. Hemoglobin of 9.1   LOS: 2 Ethan Clayburn 12/26/202011:10 AM

## 2019-06-23 NOTE — Procedures (Signed)
Central Venous Dailysis Catheter Placement: TRIPLE  LUMEN TRIALYSIS Indication: Hemo Dialysis/CRRT   Consent:written   Hand washing performed prior to starting the procedure.   Procedure: An active timeout was performed and correct patient, name, & ID confirmed. Patient was positioned correctly for central venous access. Patient was prepped using strict sterile technique including chlorohexadine preps, sterile drape, sterile gown and sterile gloves.  The area was prepped, draped and anesthetized in the usual sterile manner. Patient comfort was obtained.    A Double lumen catheter was placed in RT femoral  Vein There was good blood return, catheter caps were placed on lumens, catheter flushed easily, the line was secured and a sterile dressing and BIO-PATCH applied.   Ultrasound was used to visualize vasculature and guidance of needle.   Number of Attempts: 1 Complications:none  Estimated Blood Loss: none Operator: Konstantina Nachreiner.   Corrin Parker, M.D.  [facility]  Medical Director [place] Director [facility] Cardio-Pulmonary Department

## 2019-06-24 LAB — RENAL FUNCTION PANEL
Albumin: 2.7 g/dL — ABNORMAL LOW (ref 3.5–5.0)
Anion gap: 12 (ref 5–15)
BUN: 65 mg/dL — ABNORMAL HIGH (ref 8–23)
CO2: 23 mmol/L (ref 22–32)
Calcium: 8.1 mg/dL — ABNORMAL LOW (ref 8.9–10.3)
Chloride: 103 mmol/L (ref 98–111)
Creatinine, Ser: 3.4 mg/dL — ABNORMAL HIGH (ref 0.61–1.24)
GFR calc Af Amer: 21 mL/min — ABNORMAL LOW (ref >60)
GFR calc non Af Amer: 18 mL/min — ABNORMAL LOW (ref >60)
Glucose, Bld: 141 mg/dL — ABNORMAL HIGH (ref 70–99)
Phosphorus: 4.1 mg/dL (ref 2.5–4.6)
Potassium: 4.1 mmol/L (ref 3.5–5.1)
Sodium: 138 mmol/L (ref 135–145)

## 2019-06-24 LAB — CBC
HCT: 26.6 % — ABNORMAL LOW (ref 39.0–52.0)
Hemoglobin: 8.8 g/dL — ABNORMAL LOW (ref 13.0–17.0)
MCH: 29.8 pg (ref 26.0–34.0)
MCHC: 33.1 g/dL (ref 30.0–36.0)
MCV: 90.2 fL (ref 80.0–100.0)
Platelets: 366 10*3/uL (ref 150–400)
RBC: 2.95 MIL/uL — ABNORMAL LOW (ref 4.22–5.81)
RDW: 14.2 % (ref 11.5–15.5)
WBC: 13.4 10*3/uL — ABNORMAL HIGH (ref 4.0–10.5)
nRBC: 0.1 % (ref 0.0–0.2)

## 2019-06-24 LAB — HEPATITIS B SURFACE ANTIGEN: Hepatitis B Surface Ag: NONREACTIVE

## 2019-06-24 LAB — HEPARIN LEVEL (UNFRACTIONATED): Heparin Unfractionated: 0.36 IU/mL (ref 0.30–0.70)

## 2019-06-24 LAB — HEPATITIS B CORE ANTIBODY, TOTAL: Hep B Core Total Ab: NONREACTIVE

## 2019-06-24 LAB — GLUCOSE, CAPILLARY
Glucose-Capillary: 212 mg/dL — ABNORMAL HIGH (ref 70–99)
Glucose-Capillary: 261 mg/dL — ABNORMAL HIGH (ref 70–99)
Glucose-Capillary: 259 mg/dL — ABNORMAL HIGH (ref 70–99)

## 2019-06-24 LAB — HEPATITIS B SURFACE ANTIBODY,QUALITATIVE: Hep B S Ab: NONREACTIVE

## 2019-06-24 MED ORDER — TRAZODONE HCL 50 MG PO TABS
50.0000 mg | ORAL_TABLET | Freq: Every evening | ORAL | Status: DC | PRN
  Administered 2019-06-24 – 2019-06-27 (×5): 50 mg via ORAL
  Filled 2019-06-24 (×5): qty 1

## 2019-06-24 NOTE — Progress Notes (Signed)
Progress Note  Patient Name: Bryce Garcia Date of Encounter: 06/24/2019  Primary Cardiologist: [facility]  Subjective   Reports tolerating hemodialysis well yesterday, Overall feels his breathing is improving 1 L out by HD, 1.4 L negative past 24 hours  Notes from yesterday Central line placed right femoral vein for hemodialysis Reports he has follow-up with vascular surgery at [facility] next month for leg pain, ischemic disease  Inpatient Medications    Scheduled Meds: . albuterol  2 puff Inhalation Q6H  . amLODipine  10 mg Oral Daily  . aspirin EC  81 mg Oral Daily  . atorvastatin  80 mg Oral Daily  . azithromycin  500 mg Oral Daily  . carvedilol  25 mg Oral BID  . Chlorhexidine Gluconate Cloth  6 each Topical Daily  . DULoxetine  30 mg Oral Daily  . insulin aspart  0-20 Units Subcutaneous TID WC  . insulin aspart  0-5 Units Subcutaneous QHS  . insulin aspart  4 Units Subcutaneous TID WC  . insulin detemir  25 Units Subcutaneous QHS  . levothyroxine  100 mcg Oral Daily  . pentoxifylline  400 mg Oral TID  . tuberculin  5 Units Intradermal Once   Continuous Infusions: . cefTRIAXone (ROCEPHIN)  IV Stopped (06/23/19 1920)  . heparin 1,550 Units/hr (06/23/19 1250)   PRN Meds: alum & mag hydroxide-simeth, ipratropium-albuterol, traMADol, traZODone   Vital Signs    Vitals:   06/23/19 1858 06/23/19 2003 06/24/19 0420 06/24/19 1141  BP:  (!) 152/60 (!) 145/60 (!) 148/65  Pulse:  73 79 70  Resp:    18  Temp:  97.7 F (36.5 C) 99 F (37.2 C) 97.6 F (36.4 C)  TempSrc:  Oral Oral Oral  SpO2: 93% 96% 97% 93%  Weight:      Height:        Intake/Output Summary (Last 24 hours) at 06/24/2019 1505 Last data filed at 06/24/2019 1200 Gross per 24 hour  Intake 184.67 ml  Output 1836 ml  Net -1651.33 ml   Last 3 Weights 06/23/2019 06/21/2019 01/15/2016  Weight (lbs) 197 lb 5 oz 195 lb 182 lb  Weight (kg) 89.5 kg 88.451 kg 82.555 kg      Telemetry    Normal sinus rhythm-  Personally Reviewed  ECG     - Personally Reviewed  Physical Exam   Constitutional:  oriented to person, place, and time. No distress.  HENT:  Head: Grossly normal Eyes:  no discharge. No scleral icterus.  Neck: No JVD, no carotid bruits  Cardiovascular: Regular rate and rhythm, no murmurs appreciated Decreased pulses lower extremities Pulmonary/Chest: Clear with dullness at the bases Abdominal: Soft.  no distension.  no tenderness.  Musculoskeletal: Normal range of motion Neurological:  normal muscle tone. Coordination normal. No atrophy Skin: Skin warm and dry Psychiatric: normal affect, pleasant   Labs    High Sensitivity Troponin:   Recent Labs  Lab 06/21/19 0156 06/21/19 0412 06/21/19 0433 06/21/19 1439  TROPONINIHS 1,369* 2,780* 5,300* 8,795*      Chemistry Recent Labs  Lab 06/22/19 0506 06/23/19 0317 06/24/19 0633  NA 132* 136 138  K 4.9 4.5 4.1  CL 100 102 103  CO2 22 18* 23  GLUCOSE 417* 118* 141*  BUN 82* 90* 65*  CREATININE 4.53* 4.62* 3.40*  CALCIUM 8.7* 8.4* 8.1*  ALBUMIN  --   --  2.7*  GFRNONAA 13* 12* 18*  GFRAA 15* 14* 21*  ANIONGAP 10 16* 12  Hematology Recent Labs  Lab 06/22/19 0506 06/23/19 0630 06/24/19 0632  WBC 13.3* 19.4* 13.4*  RBC 2.93* 3.08* 2.95*  HGB 8.6* 9.1* 8.8*  HCT 26.4* 26.8* 26.6*  MCV 90.1 87.0 90.2  MCH 29.4 29.5 29.8  MCHC 32.6 34.0 33.1  RDW 13.6 13.9 14.2  PLT 350 375 366    BNP Recent Labs  Lab 06/21/19 0155  BNP 1,167.0*     DDimer No results for input(s): DDIMER in the last 168 hours.   Radiology    No results found.  Cardiac Studies   Echocardiogram   1. Left ventricular ejection fraction, by visual estimation, is 50 to 55%. The left ventricle has normal function. There is no left ventricular hypertrophy.  2. Left ventricular diastolic parameters are consistent with Grade II diastolic dysfunction (pseudonormalization).  3. The left ventricle has no regional wall motion  abnormalities.  4. Global right ventricle has normal systolic function.The right ventricular size is normal. No increase in right ventricular wall thickness.  5. Left atrial size was mildly dilated.  6. Moderate mitral valve regurgitation.  7. TR signal is inadequate for assessing pulmonary artery systolic pressure.  Patient Profile     68 year old gentleman with past medical history of coronary disease, bypass surgery February 2017, prior stenting to left circumflex, OM/in-stent restenosis and restenting, ejection fraction 50% March 2020, recent Covid infection 1 month ago, chronic kidney disease stage III, type 2 diabetes, COPD, presenting with worsening shortness of breath.  Assessment & Plan    Non-STEMI In the setting of acute respiratory distress/possible pneumonia, saturation 68% on room air on arrival, suspected pneumonitis post Covid -Echocardiogram with ejection fraction 50 to 55%, grossly no regional wall motion abnormalities --- Given worsening renal dysfunction, needing hemodialysis bridge for ATN,  --- Discussed with him, cardiac catheterization on hold in the setting of acute on chronic renal failure,  ----Unable to lay flat for stress testing tomorrow given significant cough/pneumonitis/bronchitis --May need to defer ischemic work-up until respiratory status has improved and able to lay supine  Acute respiratory distress Concern for pneumonia, multifocal on chest x-ray, post Covid pneumonitis --- Improving with dialysis and antibiotics, oxygen  Chronic diastolic CHF   significant abdominal bloating/swelling consistent with fluid retention on arrival -On Lasix IV twice daily, abdominal bloating swelling resolved Worsening renal dysfunction, likely ATN, on bridging dialysis  Acute on chronic renal failure Concern for ATN in the setting of hypoxia He does have bilateral renal atrophy greater on the right on ultrasound == Followed by nephrology First round hemodialysis  yesterday  Long discussion with family at the bedside, nephrology  Total encounter time more than 25 minutes  Greater than 50% was spent in counseling and coordination of care with the patient   For questions or updates, please contact [facility] Please consult [email or web address] for contact info under        Signed, Ida Rogue, MD  06/24/2019, 3:05 PM

## 2019-06-24 NOTE — Progress Notes (Signed)
ANTICOAGULATION CONSULT NOTE  Pharmacy Consult for Heparin Indication: chest pain/ACS  Patient Measurements: Height: 5\' 8"  (172.7 cm) Weight: 197 lb 5 oz (89.5 kg) IBW/kg (Calculated) : 68.4 HEPARIN DW (KG): 86.4  Vital Signs: Temp: 99 F (37.2 C) (12/27 0420) Temp Source: Oral (12/27 0420) BP: 145/60 (12/27 0420) Pulse Rate: 79 (12/27 0420)  Labs: Recent Labs    06/21/19 1439 06/22/19 0506 06/23/19 0317 06/23/19 0630 06/23/19 1530 06/23/19 2324 06/24/19 0632  HGB  --  8.6*  --  9.1*  --   --   --   HCT  --  26.4*  --  26.8*  --   --   --   PLT  --  350  --  375  --   --   --   APTT 52*  --   --   --   --   --   --   HEPARINUNFRC 0.12*  --  0.28*  --  0.35 0.60 0.36  CREATININE  --  4.53* 4.62*  --   --   --   --   TROPONINIHS 8,795*  --   --   --   --   --   --     Estimated Creatinine Clearance: 17.1 mL/min (A) (by C-G formula based on SCr of 4.62 mg/dL (H)).   Medical History: Past Medical History:  Diagnosis Date  . CAD (coronary artery disease)   . CKD (chronic kidney disease), stage III   . COPD (chronic obstructive pulmonary disease) ([place])   . COVID-19   . Diabetes mellitus without complication ([place])   . HFrEF (heart failure with reduced ejection fraction) ([place])   . LBBB (left bundle branch block)   . Venous ulcer [facility])     Assessment: 68 y.o. male with a hx of CAD s/p 4-vessel CABG with LIMA-LAD, SVG-diagonal, SVG to OM, SVG to RCA with non-STEMI in 07/2015 status post PCI/DES. Pharmacy asked to initiate and monitor Heparin for ACS.  No anticoagulants except aspirin and clopidogrel PTA per current med list. HS troponin rising; Baseline labs: INR 1.0, H&H: 9.1/28.3, PLT wnl  Heparin Course: 12/24 4000 unit bolus, then 1000 units/hr 12/24 1439 aPTT 52s, HL 0.12 12/25 0016 HL 0.32, therapeutic x 1 12/25 0752 HL 0.17, subtherapeutic. Will bolus 1200 units and increase rate to 1450 units/hr.  12/25 1910 HL 0.49, therapeutic 12/26 0317 HL 0.28,  subtherapeutic but only barely below goal 12/26 1530 HL 0.35, therapeutic 12/26 2324 HL 0.60, therapeutic x 2 12/27 [id number] HL= 0.65   Goal of Therapy:  Heparin level 0.3-0.7 units/ml Monitor platelets by anticoagulation protocol: Yes   Plan:  12/27 [id number] HL= 0.65 Heparin level therapeutic:  Will continue Heparin infusion at 1550 units/hr.  Daily CBC & HL.  Mabel Roll A, PharmD 06/24/2019,8:06 AM

## 2019-06-24 NOTE — Progress Notes (Signed)
PROGRESS NOTE    Bryce Garcia  [id number] DOB: Nov 17, 1950 DOA: 06/21/2019 PCP: [place]    Brief Narrative:  Bryce Garcia a 68 y.o.malewith medical history significanttype 1 diabetes, CAD status post CABG, systolic heart failure, last EF 50%, CKD 4, type 1 diabetes, hypertension, who had COVID-19 infection over 4 weeks ago from which he states he had completely resolved, who presents with a 4-day history of progressively worsening shortness of breath. Denies chest pain and denies lower extremity pain or swelling. He denies having a cough or fever. Denies nausea vomiting or diaphoresis, denies abdominal pain or change in bowel habits. On arrival in the emergency room his O2 sat was 68% on room air, improving to 94% on 4 L. Temperature was 99 with blood pressure 157/62 heart rate 83 and respirations 18/min. His chest x-ray showed multifocal airspace opacities with consolidation of the left midlung. On his blood work BNP was elevated at 1167. EKG showed no acute ST-T wave changes. Other blood work remarkable for creatinine of 3.88 up from his baseline of 2.87, blood sugar of 233, hemoglobin of 9.1, down from baseline of 11.7 and white cell count of 12.5. Baseline labs were obtained from review of care everywhere from [facility] where patient gets most of his care. RepeatCovid test in the ER was negative. She was started on Rocephin and azithromycin and hospitalization requested. Status discussed and patient elects to be DNR,was confirmed with son on phone.    Consultants:   Cardiology, nephrology  Procedures:  Echo 12/24 1. Left ventricular ejection fraction, by visual estimation, is 50 to 55%. The left ventricle has normal function. There is no left ventricular hypertrophy.  2. Left ventricular diastolic parameters are consistent with Grade II diastolic dysfunction (pseudonormalization).  3. The left ventricle has no regional  wall motion abnormalities.  4. Global right ventricle has normal systolic function.The right ventricular size is normal. No increase in right ventricular wall thickness.  5. Left atrial size was mildly dilated.  6. Moderate mitral valve regurgitation.  7. TR signal is inadequate for assessing pulmonary artery systolic pressure.  Antimicrobials:   Azithromycin and ceftriaxone    Subjective: Feeling better, less edematous, tolerated HD yesterday. Reports feels better. Also sob better. On 5L [place] this am. UF of 1L Objective: Vitals:   06/23/19 1858 06/23/19 2003 06/24/19 0420 06/24/19 1141  BP:  (!) 152/60 (!) 145/60 (!) 148/65  Pulse:  73 79 70  Resp:    18  Temp:  97.7 F (36.5 C) 99 F (37.2 C) 97.6 F (36.4 C)  TempSrc:  Oral Oral Oral  SpO2: 93% 96% 97% 93%  Weight:      Height:        Intake/Output Summary (Last 24 hours) at 06/24/2019 1410 Last data filed at 06/24/2019 1200 Gross per 24 hour  Intake 184.67 ml  Output 1836 ml  Net -1651.33 ml   Filed Weights   06/21/19 0150 06/23/19 1530  Weight: 88.5 kg 89.5 kg    Examination: General exam: Appears calm and comfortable, NAD, eating breakfast, nontachypneic Respiratory system: minimal scattered crackles, no rhonchi, decrease bs at bases b/l Cardiovascular system: S1 & S2 heard, RRR.  No murmurs, rubs, gallops. Gastrointestinal system: Abdomen is mild distention, soft and nontender. Normal bowel sounds heard. No rebound/guarding Central nervous system: Alert and oriented. No focal neurological deficits. Extremities: bilateral lower extremity and UE edema improved  Skin: Warm dry Psychiatry: Judgement and insight appear normal.  Mood & affect appropriate.      Data Reviewed: I have personally reviewed following labs and imaging studies  CBC: Recent Labs  Lab 06/21/19 0154 06/22/19 0506 06/23/19 0630 06/24/19 0632  WBC 12.5* 13.3* 19.4* 13.4*  NEUTROABS 10.2*  --   --   --   HGB 9.1* 8.6* 9.1* 8.8*  HCT  28.3* 26.4* 26.8* 26.6*  MCV 90.4 90.1 87.0 90.2  PLT 349 350 375 [id number]   Basic Metabolic Panel: Recent Labs  Lab 06/21/19 0154 06/22/19 0506 06/23/19 0317 06/24/19 0633  NA 139 132* 136 138  K 4.8 4.9 4.5 4.1  CL 104 100 102 103  CO2 21* 22 18* 23  GLUCOSE 233* 417* 118* 141*  BUN 55* 82* 90* 65*  CREATININE 3.88* 4.53* 4.62* 3.40*  CALCIUM 8.7* 8.7* 8.4* 8.1*  PHOS  --   --   --  4.1   GFR: Estimated Creatinine Clearance: 23.2 mL/min (A) (by C-G formula based on SCr of 3.4 mg/dL (H)). Liver Function Tests: Recent Labs  Lab 06/24/19 0633  ALBUMIN 2.7*   No results for input(s): LIPASE, AMYLASE in the last 168 hours. No results for input(s): AMMONIA in the last 168 hours. Coagulation Profile: Recent Labs  Lab 06/21/19 0155  INR 1.0   Cardiac Enzymes: No results for input(s): CKTOTAL, CKMB, CKMBINDEX, TROPONINI in the last 168 hours. BNP (last 3 results) No results for input(s): PROBNP in the last 8760 hours. HbA1C: No results for input(s): HGBA1C in the last 72 hours. CBG: Recent Labs  Lab 06/23/19 0738 06/23/19 1121 06/23/19 1827 06/23/19 2129 06/24/19 1139  GLUCAP 99 244* 166* 213* 212*   Lipid Profile: Recent Labs    06/22/19 0506  CHOL 167  HDL 48  LDLCALC 104*  TRIG 77  CHOLHDL 3.5   Thyroid Function Tests: No results for input(s): TSH, T4TOTAL, FREET4, T3FREE, THYROIDAB in the last 72 hours. Anemia Panel: No results for input(s): VITAMINB12, FOLATE, FERRITIN, TIBC, IRON, RETICCTPCT in the last 72 hours. Sepsis Labs: Recent Labs  Lab 06/21/19 0155 06/21/19 0412 06/21/19 0433  PROCALCITON  --   --  0.30  LATICACIDVEN 1.4 0.8  --     Recent Results (from the past 240 hour(s))  Blood Culture (routine x 2)     Status: None (Preliminary result)   Collection Time: 06/21/19  2:08 AM   Specimen: BLOOD  Result Value Ref Range Status   Specimen Description BLOOD BLOOD RIGHT FOREARM  Final   Special Requests   Final    BOTTLES DRAWN AEROBIC  AND ANAEROBIC Blood Culture adequate volume   Culture   Final    NO GROWTH 3 DAYS Performed at [facility], [address], [place], [place] [address]    Report Status PENDING  Incomplete  Blood Culture (routine x 2)     Status: None (Preliminary result)   Collection Time: 06/21/19  2:13 AM   Specimen: BLOOD  Result Value Ref Range Status   Specimen Description BLOOD LEFT ANTECUBITAL  Final   Special Requests   Final    BOTTLES DRAWN AEROBIC AND ANAEROBIC Blood Culture results may not be optimal due to an inadequate volume of blood received in culture bottles   Culture   Final    NO GROWTH 3 DAYS Performed at [facility], [address] [place], [place] [address]    Report Status PENDING  Incomplete  Respiratory Panel by PCR     Status: None   Collection Time:  06/21/19 11:56 AM   Specimen: Nasopharyngeal Swab; Respiratory  Result Value Ref Range Status   Adenovirus NOT DETECTED NOT DETECTED Final   Coronavirus 229E NOT DETECTED NOT DETECTED Final    Comment: (NOTE) The Coronavirus on the Respiratory Panel, DOES NOT test for the novel  Coronavirus (2019 nCoV)    Coronavirus HKU1 NOT DETECTED NOT DETECTED Final   Coronavirus NL63 NOT DETECTED NOT DETECTED Final   Coronavirus OC43 NOT DETECTED NOT DETECTED Final   Metapneumovirus NOT DETECTED NOT DETECTED Final   Rhinovirus / Enterovirus NOT DETECTED NOT DETECTED Final   Influenza A NOT DETECTED NOT DETECTED Final   Influenza B NOT DETECTED NOT DETECTED Final   Parainfluenza Virus 1 NOT DETECTED NOT DETECTED Final   Parainfluenza Virus 2 NOT DETECTED NOT DETECTED Final   Parainfluenza Virus 3 NOT DETECTED NOT DETECTED Final   Parainfluenza Virus 4 NOT DETECTED NOT DETECTED Final   Respiratory Syncytial Virus NOT DETECTED NOT DETECTED Final   Bordetella pertussis NOT DETECTED NOT DETECTED Final   Chlamydophila pneumoniae NOT DETECTED NOT DETECTED Final   Mycoplasma pneumoniae NOT DETECTED NOT DETECTED  Final    Comment: Performed at [facility], [place]. [address], [place], [place] [address]         Radiology Studies: No results found.      Scheduled Meds: . albuterol  2 puff Inhalation Q6H  . amLODipine  10 mg Oral Daily  . aspirin EC  81 mg Oral Daily  . atorvastatin  80 mg Oral Daily  . azithromycin  500 mg Oral Daily  . carvedilol  25 mg Oral BID  . Chlorhexidine Gluconate Cloth  6 each Topical Daily  . DULoxetine  30 mg Oral Daily  . insulin aspart  0-20 Units Subcutaneous TID WC  . insulin aspart  0-5 Units Subcutaneous QHS  . insulin aspart  4 Units Subcutaneous TID WC  . insulin detemir  25 Units Subcutaneous QHS  . levothyroxine  100 mcg Oral Daily  . pentoxifylline  400 mg Oral TID  . tuberculin  5 Units Intradermal Once   Continuous Infusions: . cefTRIAXone (ROCEPHIN)  IV Stopped (06/23/19 1920)  . heparin 1,550 Units/hr (06/23/19 1250)    Assessment & Plan:   Principal Problem:   Acute respiratory failure with hypoxia (HCC) Active Problems:   CAP (community acquired pneumonia)   Venous ulcer of left leg (HCC)   COPD with chronic bronchitis (HCC)   CKD (chronic kidney disease) stage 4, GFR 15-29 ml/min (HCC)   AKI (acute kidney injury) ([place])   Chronic systolic CHF (congestive heart failure) ([place])   History of 2019 novel coronavirus disease (COVID-19)   Anemia   Acute respiratory failure (HCC)   Hyperglycemia due to type 1 diabetes mellitus (HCC)   Hx of CABG   NSTEMI (non-ST elevated myocardial infarction) ([place])   NSTEMI --In setting of acute respiratory distress and pneumonia  elevated troponin Echo with EF of 50 to 55%, grossly no wall motion abnormality Cardiology following -recommend continuing heparin until tomorrow to finish 48 hours.   Needs ischemic w/u however not cardiac cath as renal function worsening at this time. Maybe after HD starts or pharmacologic myoview Continue with beta-blockers and aspirin, statin   Acute  respiratory failure with hypoxia (HCC) --Most likely related to community-acquired pneumonia and CHF exacerbation. Imroving with abx and start of HD (first HD on 12/26) --O2 sat was 68% on room air on arrival improving to 94% on 4 L patient  has no prior history of home oxygen use --Supplemental oxygen to keep sats over 90% Stopped Decadron as he does not require at this time Continue  IV antibiotics  Iv lasix D/c'd.  HD evaluation daily-nephrology following Try to wean off 02 , but still requiring quite a bit   Community acquired pneumonia in the setting of recent history of COVID-19 infection about 4 weeks ago-- --Patient has no prior history of supplemental oxygen use ---Chest x-ray showing multifocal pneumonia with consolidative opacity in the left midlung --Repeat Covid test /respiratory panel negative --IV Rocephin and azithromycin for 5 to 7 days --Albuterol every 6 hours and as needed --Supplemental oxygen to keep sats over 90% --Blood cultures pending.-so far negative.  -respiratory panel negative  Acute onChronic Diastolic and systolic CHF (congestive heart failure) ([place]) --Patient followed at [facility].Last EF on file was 50%on 3/2020improved from 40% Echo here EF 50-55% with Grade II diastolic dysfunction. -- Chest x-ray shows small bilateral pleural effusion --Appeared volume overloaded, started on  Lasix IV- with worsening of renal function and not great response with diuretics, improving with temporary HD. Strict I/O Daily weight   Anemia,uncertain acuity-likely ACKD --Hemoglobin 9.1. Most hemoglobin on file on chart everywhere was 11.7 --Suspect related to worsening kidney function --Stool for occult blood to evaluate for GI etiology-pending --Monitor H&H  Hyperglycemia due to type 1 diabetes mellitus ([place]) Decadron stopped Now BG better continue Lantus to 25Units . continue novolog to 4u with meals carb control  Diet with low sodium HA1C  6.8   Venous ulcer of left leg (HCC) --Recently seen by PCP on 06/19/2019 --Daily wound care  COPD with chronic bronchitis ([place]) --Does not appear to be acutely exacerbated --Albuterol prn   Acute kidney injury superimposed onCKD (chronic kidney disease) stage 4, GFR 15-29 ml/min (HCC) with metabolic acidosis --BUN creatinine of55/3.88,most recent creatinine of 2.85 in June 2020 per Care Everywhere Was worsening, despite diuretic.  Nephrology started temporary HD , first on 12/26. Evaluate daily for need of HD. Dialysis catheter placed on 12/26.   history ofCABG --Patient has history of CABG in 2005 -Continue with aspirin, statin, beta-blockers   PT-recommend SNF  DVT prophylaxis Heparin Code Status: DNR Family Communication: None at bedside Disposition Plan: SNF on d/c. Will likely be here 2 MN days as he is not medically stable.     LOS: 3 days   Time spent: 45 minutes with more than 50% COC    Bryce Hanlon, MD [facility] Pager [phone]  If 7PM-7AM, please contact night-coverage [email or web address] Password [email or web address] 06/24/2019, 2:10 PM Patient ID: Bryce Garcia, male   DOB: 08/17/1950, 68 y.o.   MRN: [id number]

## 2019-06-24 NOTE — Progress Notes (Signed)
[facility]  ROUNDING NOTE   Subjective:   Hemodialysis treatment yesterday. Tolerated first treatment well. UF of 1 liter.  UOP of  860mL.  Patient states he is breathing better and feels much better. Off oxygen.   Objective:  Vital signs in last 24 hours:  Temp:  [97.6 F (36.4 C)-99 F (37.2 C)] 97.6 F (36.4 C) (12/27 1141) Pulse Rate:  [66-79] 70 (12/27 1141) Resp:  [17-20] 18 (12/27 1141) BP: (128-166)/(60-76) 148/65 (12/27 1141) SpO2:  [91 %-97 %] 93 % (12/27 1141) Weight:  [89.5 kg] 89.5 kg (12/26 1530)  Weight change:  Filed Weights   06/21/19 0150 06/23/19 1530  Weight: 88.5 kg 89.5 kg    Intake/Output: I/O last 3 completed shifts: In: 894.7 [P.O.:240; I.V.:554.7; IV Piggyback:100] Out: 2086 [Urine:1075; Other:1011]   Intake/Output this shift:  Total I/O In: 100 [IV Piggyback:100] Out: 250 [Urine:250]  Physical Exam: General: NAD, sitting up in bed  Head: Normocephalic, atraumatic. Moist oral mucosal membranes  Eyes: Anicteric, PERRL  Neck: Supple, trachea midline  Lungs:  Basilar crackles  Heart: Regular rate and rhythm  Abdomen:  Soft, nontender,   Extremities:  trace peripheral edema.  Neurologic: Nonfocal, moving all four extremities  Skin: Left lower extremity ulcer with clean dressings   Access Right temp femoral HD catheter 12/26 Dr. Mortimer Fries    Basic Metabolic Panel: Recent Labs  Lab 06/21/19 0154 06/22/19 0506 06/23/19 0317 06/24/19 0633  NA 139 132* 136 138  K 4.8 4.9 4.5 4.1  CL 104 100 102 103  CO2 21* 22 18* 23  GLUCOSE 233* 417* 118* 141*  BUN 55* 82* 90* 65*  CREATININE 3.88* 4.53* 4.62* 3.40*  CALCIUM 8.7* 8.7* 8.4* 8.1*  PHOS  --   --   --  4.1    Liver Function Tests: Recent Labs  Lab 06/24/19 0633  ALBUMIN 2.7*   No results for input(s): LIPASE, AMYLASE in the last 168 hours. No results for input(s): AMMONIA in the last 168 hours.  CBC: Recent Labs  Lab 06/21/19 0154 06/22/19 0506 06/23/19 0630  06/24/19 0632  WBC 12.5* 13.3* 19.4* 13.4*  NEUTROABS 10.2*  --   --   --   HGB 9.1* 8.6* 9.1* 8.8*  HCT 28.3* 26.4* 26.8* 26.6*  MCV 90.4 90.1 87.0 90.2  PLT 349 350 375 366    Cardiac Enzymes: No results for input(s): CKTOTAL, CKMB, CKMBINDEX, TROPONINI in the last 168 hours.  BNP: Invalid input(s): POCBNP  CBG: Recent Labs  Lab 06/23/19 0738 06/23/19 1121 06/23/19 1827 06/23/19 2129 06/24/19 1139  GLUCAP 99 244* 166* 213* 212*    Microbiology: Results for orders placed or performed during the hospital encounter of 06/21/19  Blood Culture (routine x 2)     Status: None (Preliminary result)   Collection Time: 06/21/19  2:08 AM   Specimen: BLOOD  Result Value Ref Range Status   Specimen Description BLOOD BLOOD RIGHT FOREARM  Final   Special Requests   Final    BOTTLES DRAWN AEROBIC AND ANAEROBIC Blood Culture adequate volume   Culture   Final    NO GROWTH 3 DAYS Performed at [facility], [address] [place], [place] [address]    Report Status PENDING  Incomplete  Blood Culture (routine x 2)     Status: None (Preliminary result)   Collection Time: 06/21/19  2:13 AM   Specimen: BLOOD  Result Value Ref Range Status   Specimen Description BLOOD LEFT ANTECUBITAL  Final  Special Requests   Final    BOTTLES DRAWN AEROBIC AND ANAEROBIC Blood Culture results may not be optimal due to an inadequate volume of blood received in culture bottles   Culture   Final    NO GROWTH 3 DAYS Performed at [facility], [address] [place], [place] [address]    Report Status PENDING  Incomplete  Respiratory Panel by PCR     Status: None   Collection Time: 06/21/19 11:56 AM   Specimen: Nasopharyngeal Swab; Respiratory  Result Value Ref Range Status   Adenovirus NOT DETECTED NOT DETECTED Final   Coronavirus 229E NOT DETECTED NOT DETECTED Final    Comment: (NOTE) The Coronavirus on the Respiratory Panel, DOES NOT test for the novel  Coronavirus (2019  nCoV)    Coronavirus HKU1 NOT DETECTED NOT DETECTED Final   Coronavirus NL63 NOT DETECTED NOT DETECTED Final   Coronavirus OC43 NOT DETECTED NOT DETECTED Final   Metapneumovirus NOT DETECTED NOT DETECTED Final   Rhinovirus / Enterovirus NOT DETECTED NOT DETECTED Final   Influenza A NOT DETECTED NOT DETECTED Final   Influenza B NOT DETECTED NOT DETECTED Final   Parainfluenza Virus 1 NOT DETECTED NOT DETECTED Final   Parainfluenza Virus 2 NOT DETECTED NOT DETECTED Final   Parainfluenza Virus 3 NOT DETECTED NOT DETECTED Final   Parainfluenza Virus 4 NOT DETECTED NOT DETECTED Final   Respiratory Syncytial Virus NOT DETECTED NOT DETECTED Final   Bordetella pertussis NOT DETECTED NOT DETECTED Final   Chlamydophila pneumoniae NOT DETECTED NOT DETECTED Final   Mycoplasma pneumoniae NOT DETECTED NOT DETECTED Final    Comment: Performed at [facility], [place]. [address] [place], [place] [address]    Coagulation Studies: No results for input(s): LABPROT, INR in the last 72 hours.  Urinalysis: No results for input(s): COLORURINE, LABSPEC, PHURINE, GLUCOSEU, HGBUR, BILIRUBINUR, KETONESUR, PROTEINUR, UROBILINOGEN, NITRITE, LEUKOCYTESUR in the last 72 hours.  Invalid input(s): APPERANCEUR    Imaging: No results found.   Medications:   . cefTRIAXone (ROCEPHIN)  IV Stopped (06/23/19 1920)  . heparin 1,550 Units/hr (06/23/19 1250)   . albuterol  2 puff Inhalation Q6H  . amLODipine  10 mg Oral Daily  . aspirin EC  81 mg Oral Daily  . atorvastatin  80 mg Oral Daily  . azithromycin  500 mg Oral Daily  . carvedilol  25 mg Oral BID  . Chlorhexidine Gluconate Cloth  6 each Topical Daily  . DULoxetine  30 mg Oral Daily  . insulin aspart  0-20 Units Subcutaneous TID WC  . insulin aspart  0-5 Units Subcutaneous QHS  . insulin aspart  4 Units Subcutaneous TID WC  . insulin detemir  25 Units Subcutaneous QHS  . levothyroxine  100 mcg Oral Daily  . pentoxifylline  400 mg Oral TID  .  tuberculin  5 Units Intradermal Once   alum & mag hydroxide-simeth, ipratropium-albuterol, traMADol, traZODone  Assessment/ Plan:  [name] is a 68 y.o. white male with insulin dependent diabetes mellitus type II, hypertension, coronary artery disease status post CABG, COPD, COVID-19 infection 04/2019, chronic venous stasis ulcer, , who was admitted to [facility] on 06/21/2019 for acute exacerbation of systolic congestive heart failure. Echocardiogram 3/8 at [facility] with EF of 50%.   1. Acute kidney failure with metabolic acidosis and hyponatremia on chronic kidney disease stage IV with proteinuria: baseline creatinine of 2.85, GFR of 22 on 12/18/18.  Chronic kidney disease secondary to diabetic nephropathy. Followed by [facility],  Dr. Alfonse Alpers Acute kidney injury secondary to acute cardiorenal syndrome. Not on an ACE-I/ARB at home.  - Patient requiring temporary dialysis. First treatment on 12/16. No indication for dialysis today.  Evaluate daily for dialysis need.   2. Hypertension with acute exacerbation of diastolic congestive heart failure.   3. Anemia with chronic kidney disease: normocytic. Hemoglobin of 8.8   LOS: 3 Bryce Garcia 12/27/20201:10 PM

## 2019-06-24 NOTE — Progress Notes (Signed)
ANTICOAGULATION CONSULT NOTE  Pharmacy Consult for Heparin Indication: chest pain/ACS  Patient Measurements: Height: 5\' 8"  (172.7 cm) Weight: 197 lb 5 oz (89.5 kg) IBW/kg (Calculated) : 68.4 HEPARIN DW (KG): 86.4  Vital Signs: Temp: 97.7 F (36.5 C) (12/26 2003) Temp Source: Oral (12/26 2003) BP: 152/60 (12/26 2003) Pulse Rate: 73 (12/26 2003)  Labs: Recent Labs    06/21/19 0154 06/21/19 0155 06/21/19 0156 06/21/19 0412 06/21/19 0433 06/21/19 1439 06/22/19 0506 06/23/19 0317 06/23/19 0630 06/23/19 1530 06/23/19 2324  HGB 9.1*  --   --   --   --   --  8.6*  --  9.1*  --   --   HCT 28.3*  --   --   --   --   --  26.4*  --  26.8*  --   --   PLT 349  --   --   --   --   --  350  --  375  --   --   APTT  --   --   --   --   --  52*  --   --   --   --   --   LABPROT  --  13.1  --   --   --   --   --   --   --   --   --   INR  --  1.0  --   --   --   --   --   --   --   --   --   HEPARINUNFRC  --   --    < >  --   --  0.12*  --  0.28*  --  0.35 0.60  CREATININE 3.88*  --   --   --   --   --  4.53* 4.62*  --   --   --   TROPONINIHS  --   --   --  2,780* 5,300* 8,795*  --   --   --   --   --    < > = values in this interval not displayed.    Estimated Creatinine Clearance: 17.1 mL/min (A) (by C-G formula based on SCr of 4.62 mg/dL (H)).   Medical History: Past Medical History:  Diagnosis Date  . CAD (coronary artery disease)   . CKD (chronic kidney disease), stage III   . COPD (chronic obstructive pulmonary disease) ([place])   . COVID-19   . Diabetes mellitus without complication ([place])   . HFrEF (heart failure with reduced ejection fraction) ([place])   . LBBB (left bundle branch block)   . Venous ulcer [facility])     Assessment: 68 y.o. male with a hx of CAD s/p 4-vessel CABG with LIMA-LAD, SVG-diagonal, SVG to OM, SVG to RCA with non-STEMI in 07/2015 status post PCI/DES. Pharmacy asked to initiate and monitor Heparin for ACS.  No anticoagulants except aspirin and clopidogrel  PTA per current med list. HS troponin rising; Baseline labs: INR 1.0, H&H: 9.1/28.3, PLT wnl  Heparin Course: 12/24 4000 unit bolus, then 1000 units/hr 12/24 1439 aPTT 52s, HL 0.12 12/25 0016 HL 0.32, therapeutic x 1 12/25 0752 HL 0.17, subtherapeutic. Will bolus 1200 units and increase rate to 1450 units/hr.  12/25 1910 HL 0.49, therapeutic 12/26 0317 HL 0.28, subtherapeutic but only barely below goal 12/26 1530 HL 0.35, therapeutic 12/26 2324 HL 0.60, therapeutic x 2  Goal of Therapy:  Heparin level 0.3-0.7 units/ml  Monitor platelets by anticoagulation protocol: Yes   Plan:  Heparin level therapeutic:  Will continue Heparin infusion at 1550 units/hr.  Daily CBC & HL.  Ena Dawley, PharmD 06/24/2019,12:03 AM

## 2019-06-24 NOTE — Progress Notes (Signed)
PPD administered left anterior forearm.   Fuller Mandril, RN

## 2019-06-25 DIAGNOSIS — N171 Acute kidney failure with acute cortical necrosis: Secondary | ICD-10-CM

## 2019-06-25 DIAGNOSIS — I5033 Acute on chronic diastolic (congestive) heart failure: Secondary | ICD-10-CM

## 2019-06-25 LAB — CBC
HCT: 24.8 % — ABNORMAL LOW (ref 39.0–52.0)
Hemoglobin: 8.3 g/dL — ABNORMAL LOW (ref 13.0–17.0)
MCH: 29.4 pg (ref 26.0–34.0)
MCHC: 33.5 g/dL (ref 30.0–36.0)
MCV: 87.9 fL (ref 80.0–100.0)
Platelets: 336 10*3/uL (ref 150–400)
RBC: 2.82 MIL/uL — ABNORMAL LOW (ref 4.22–5.81)
RDW: 14.3 % (ref 11.5–15.5)
WBC: 10.2 10*3/uL (ref 4.0–10.5)
nRBC: 0 % (ref 0.0–0.2)

## 2019-06-25 LAB — HEPARIN LEVEL (UNFRACTIONATED): Heparin Unfractionated: 0.1 IU/mL — ABNORMAL LOW (ref 0.30–0.70)

## 2019-06-25 LAB — URINE CULTURE: Culture: NO GROWTH

## 2019-06-25 LAB — RENAL FUNCTION PANEL
Albumin: 2.4 g/dL — ABNORMAL LOW (ref 3.5–5.0)
Anion gap: 11 (ref 5–15)
BUN: 64 mg/dL — ABNORMAL HIGH (ref 8–23)
CO2: 23 mmol/L (ref 22–32)
Calcium: 8.1 mg/dL — ABNORMAL LOW (ref 8.9–10.3)
Chloride: 105 mmol/L (ref 98–111)
Creatinine, Ser: 3.36 mg/dL — ABNORMAL HIGH (ref 0.61–1.24)
GFR calc Af Amer: 21 mL/min — ABNORMAL LOW (ref >60)
GFR calc non Af Amer: 18 mL/min — ABNORMAL LOW (ref >60)
Glucose, Bld: 127 mg/dL — ABNORMAL HIGH (ref 70–99)
Phosphorus: 4.1 mg/dL (ref 2.5–4.6)
Potassium: 3.8 mmol/L (ref 3.5–5.1)
Sodium: 139 mmol/L (ref 135–145)

## 2019-06-25 LAB — GLUCOSE, CAPILLARY
Glucose-Capillary: 112 mg/dL — ABNORMAL HIGH (ref 70–99)
Glucose-Capillary: 205 mg/dL — ABNORMAL HIGH (ref 70–99)
Glucose-Capillary: 212 mg/dL — ABNORMAL HIGH (ref 70–99)
Glucose-Capillary: 175 mg/dL — ABNORMAL HIGH (ref 70–99)

## 2019-06-25 MED ORDER — HEPARIN BOLUS VIA INFUSION
2500.0000 [IU] | Freq: Once | INTRAVENOUS | Status: AC
  Administered 2019-06-25: 2500 [IU] via INTRAVENOUS
  Filled 2019-06-25: qty 2500

## 2019-06-25 MED ORDER — FUROSEMIDE 10 MG/ML IJ SOLN
40.0000 mg | Freq: Two times a day (BID) | INTRAMUSCULAR | Status: DC
  Administered 2019-06-25 – 2019-06-26 (×3): 40 mg via INTRAVENOUS
  Filled 2019-06-25 (×3): qty 4

## 2019-06-25 MED ORDER — FUROSEMIDE 10 MG/ML IJ SOLN
40.0000 mg | Freq: Two times a day (BID) | INTRAMUSCULAR | Status: DC
  Administered 2019-06-25: 40 mg via INTRAVENOUS
  Filled 2019-06-25: qty 4

## 2019-06-25 MED ORDER — HEPARIN SODIUM (PORCINE) 5000 UNIT/ML IJ SOLN
5000.0000 [IU] | Freq: Three times a day (TID) | INTRAMUSCULAR | Status: DC
  Administered 2019-06-26 – 2019-06-28 (×7): 5000 [IU] via SUBCUTANEOUS
  Filled 2019-06-25 (×7): qty 1

## 2019-06-25 NOTE — Progress Notes (Signed)
ANTICOAGULATION CONSULT NOTE  Pharmacy Consult for Heparin Indication: chest pain/ACS  Patient Measurements: Height: 5\' 8"  (172.7 cm) Weight: 197 lb 5 oz (89.5 kg) IBW/kg (Calculated) : 68.4 HEPARIN DW (KG): 86.4  Vital Signs: Temp: 98.8 F (37.1 C) (12/28 0832) Temp Source: Oral (12/28 0832) BP: 158/68 (12/28 0832) Pulse Rate: 68 (12/28 0832)  Labs: Recent Labs    06/23/19 0317 06/23/19 0630 06/23/19 2324 06/24/19 [id number] 06/24/19 0633 06/25/19 0718  HGB  --  9.1*  --  8.8*  --  8.3*  HCT  --  26.8*  --  26.6*  --  24.8*  PLT  --  375  --  366  --  336  HEPARINUNFRC 0.28*  --  0.60 0.36  --  0.10*  CREATININE 4.62*  --   --   --  3.40* 3.36*    Estimated Creatinine Clearance: 23.5 mL/min (A) (by C-G formula based on SCr of 3.36 mg/dL (H)).   Medical History: Past Medical History:  Diagnosis Date  . CAD (coronary artery disease)   . CKD (chronic kidney disease), stage III   . COPD (chronic obstructive pulmonary disease) ([place])   . COVID-19   . Diabetes mellitus without complication ([place])   . HFrEF (heart failure with reduced ejection fraction) ([place])   . LBBB (left bundle branch block)   . Venous ulcer [facility])     Assessment: 68 y.o. male with a hx of CAD s/p 4-vessel CABG with LIMA-LAD, SVG-diagonal, SVG to OM, SVG to RCA with non-STEMI in 07/2015 status post PCI/DES. Pharmacy asked to initiate and monitor Heparin for ACS.  No anticoagulants except aspirin and clopidogrel PTA per current med list. Cardiac catheterization on hold in the setting of acute on chronic renal failure  H&H trending down, PLT wnl, no reports of bleeding noted  Heparin Course: 12/24 4000 unit bolus, then 1000 units/hr 12/24 1439 aPTT 52s, HL 0.12 12/25 0016 HL 0.32, therapeutic x 1 12/25 0752 HL 0.17, subtherapeutic. inc rate to 1450 units/hr.  12/25 1910 HL 0.49, therapeutic 12/26 0317 HL 0.28, subtherapeutic but only barely below goal 12/26 1530 HL 0.35, therapeutic 12/26 2324 HL 0.60,  therapeutic x 2 12/27 0632 HL 0.65  12/28 0718 HL 0.10  Goal of Therapy:  Heparin level 0.3-0.7 units/ml Monitor platelets by anticoagulation protocol: Yes   Plan:   Heparin level subtherapeutic:  discussed with RN and there were no overnight interruptions to therapy reported: Bolus  2500 units, then increase infusion rate to 1800 units/hr  Re-check heparin level 8 hours after changes have been implemented  Daily CBC & HL.  Dallie Piles, PharmD 06/25/2019,8:57 AM

## 2019-06-25 NOTE — Progress Notes (Addendum)
Physical Therapy Treatment Patient Details Name: Bryce Garcia MRN: [id number] DOB: [birth year] Today's Date: 06/25/2019    History of Present Illness This 68 y/o male was adm for PNA post-covid. PMH includes:  Covid infection, COPD, CAD s/p 4 vessel CABG, DM, CHF, CKD4, EF 50%. Of note, Pt recieving HD via R femoral vein central line while in acute setting.    PT Comments    Pt received supine in bed and agreeable to PT. PT session limited to bed level therex and no active hip movement RLE secondary to nontunneled R femoral hemodialysis catheter. Pt educated on limitations to therapy from catheter. Pt performed LE and UE therex with min cuing for correct performance. Mild resistance provided from PT for UE exercises. VSS t/o. Pt reporting mild SOB with therex with O2 sats mid 90s on 5L O2 nasal cannula. Pt is progressing towards PT goals. Pt will continue to benefit from acute PT to improve functional mobility.    Follow Up Recommendations  SNF     Equipment Recommendations  None recommended by PT    Recommendations for Other Services       Precautions / Restrictions Precautions Precautions: Fall Precaution Comments: R femoral hemodialysis catheter non tunneled Restrictions Weight Bearing Restrictions: No Other Position/Activity Restrictions: no flexion R hip    Mobility  Bed Mobility               General bed mobility comments: deferred, femoral line  Transfers Overall transfer level: Needs assistance Equipment used: 1 person hand held assist Transfers: Sit to/from Stand Sit to Stand: Min assist         General transfer comment: deferred, femoral line  Ambulation/Gait             General Gait Details: deferred/unable, femoral catheter   Stairs             Wheelchair Mobility    Modified Rankin (Stroke Patients Only)       Balance Overall balance assessment: Needs assistance Sitting-balance support: Feet supported Sitting balance-Leahy  Scale: Good     Standing balance support: Bilateral upper extremity supported;During functional activity Standing balance-Leahy Scale: Poor Standing balance comment: Pt requires UE support through FWW and MIN A from OT to maintain standing balance                            Cognition Arousal/Alertness: Awake/alert Behavior During Therapy: WFL for tasks assessed/performed Overall Cognitive Status: Within Functional Limits for tasks assessed                                        Exercises Total Joint Exercises Ankle Circles/Pumps: AROM;Both;20 reps Quad Sets: AROM;Both;15 reps Short Arc Quad: AROM;Both;15 reps Heel Slides: AROM;Left;10 reps Hip ABduction/ADduction: AROM;Left;20 reps Straight Leg Raises: AROM;Left;20 reps General Exercises - Upper Extremity Shoulder Flexion: AROM;Both;20 reps Other Exercises Other Exercises: chest press 2 sets of 10 reps, second set resistance provided from therapist Other Exercises: OT facilitates education re: importance of incentive spirometer use and recommendations for frequency Other Exercises: OT facilitates education re: safety and fall prevention-use of call bell for OOB activity.    General Comments        Pertinent Vitals/Pain Pain Assessment: No/denies pain    Home Living Family/patient expects to be discharged to:: Private residence Living Arrangements: Alone Available Help at Discharge: Family;Available PRN/intermittently(states  son can come stay with him for a week or so after d/c from hospital) Type of Home: House     Home Layout: One level        Prior Function Level of Independence: Needs assistance;Independent  Gait / Transfers Assistance Needed: Pt was performing all asepcts of self care, IADLs and fxl mobility with no AD without assitance until 4 days prior to admission. ADL's / Homemaking Assistance Needed: Pt reports Indep with ADLs. In addition, endorses driving, being independent with  housework including laundry. Reports performing all meal prep I'ly as well. Comments: Pt endorses decline in fxn since recovery from COVID >4wks ago   PT Goals (current goals can now be found in the care plan section) Acute Rehab PT Goals Patient Stated Goal: to get home and get stronger Progress towards PT goals: Progressing toward goals    Frequency    Min 2X/week      PT Plan Current plan remains appropriate    Co-evaluation              AM-PAC PT "6 Clicks" Mobility   Outcome Measure  Help needed turning from your back to your side while in a flat bed without using bedrails?: A Little Help needed moving from lying on your back to sitting on the side of a flat bed without using bedrails?: A Little Help needed moving to and from a bed to a chair (including a wheelchair)?: A Little Help needed standing up from a chair using your arms (e.g., wheelchair or bedside chair)?: A Lot Help needed to walk in hospital room?: A Lot Help needed climbing 3-5 steps with a railing? : A Lot 6 Click Score: 15    End of Session Equipment Utilized During Treatment: Oxygen Activity Tolerance: Treatment limited secondary to medical complications (Comment);Other (comment)(femoral catheter) Patient left: in bed;with call bell/phone within reach;with bed alarm set Nurse Communication: Mobility status PT Visit Diagnosis: Muscle weakness (generalized) (M62.81);Difficulty in walking, not elsewhere classified (R26.2)     Time: [id number] PT Time Calculation (min) (ACUTE ONLY): 18 min  Charges:  $Therapeutic Exercise: 8-22 mins                     Zachary George PT, DPT 1:07 PM,06/25/19    [name] 06/25/2019, 1:04 PM

## 2019-06-25 NOTE — TOC Initial Note (Signed)
Transition of Care [facility]) - Initial/Assessment Note    Patient Details  Name: Bryce Garcia MRN: [id number] Date of Birth: 1950/08/20  [facility] CM/SW Contact:    Shelbie Ammons, RN Phone Number: 06/25/2019, 1:09 PM  Clinical Narrative:         RNCM contacted patient via telephone for assessment and to discuss discharge planning including possibility of SNF placement. Patient reports he lives in single family home with his adult son who is able to provide assistance as needed. Patient reports that he still drives and has no problems getting his medications. Informed patient that therapist had made recommendation for patient to go to SNF once he is discharged however he reports that she didn't mention that this morning and it is his wish to return home. Patient reports he has been unable to get up and ambulate due to catheter in leg and at this time is unsure if he will have to continue with HD or not. Patient reports that he only wants to go home but that he may be agreeable to home health services. Patient reports he does not have a preference as to agency and would just prefer whoever will take his insurance. Inquired with patient if this nurse might contact his son if need be but patient requests that everyone only speak with him.   Will continue to follow.          Expected Discharge Plan: [place] Barriers to Discharge: Continued Medical Work up   Patient Goals and CMS Choice Patient states their goals for this hospitalization and ongoing recovery are:: to get back home once I am feeling better      Expected Discharge Plan and Services Expected Discharge Plan: [place]   Discharge Planning Services: CM Consult Post Acute Care Choice: [place] arrangements for the past 2 months: Single Family Home                                      Prior Living Arrangements/Services Living arrangements for the past 2  months: Single Family Home Lives with:: Adult Children Patient language and need for interpreter reviewed:: Yes Do you feel safe going back to the place where you live?: Yes      Need for Family Participation in Patient Care: Yes (Comment) Care giver support system in place?: Yes (comment)   Criminal Activity/Legal Involvement Pertinent to Current Situation/Hospitalization: No - Comment as needed  Activities of Daily Living Home Assistive Devices/Equipment: Other (Comment)(diabetic monitor) ADL Screening (condition at time of admission) Patient's cognitive ability adequate to safely complete daily activities?: Yes Is the patient deaf or have difficulty hearing?: No Does the patient have difficulty seeing, even when wearing glasses/contacts?: No Does the patient have difficulty concentrating, remembering, or making decisions?: No Patient able to express need for assistance with ADLs?: Yes Does the patient have difficulty dressing or bathing?: No Independently performs ADLs?: Yes (appropriate for developmental age) Does the patient have difficulty walking or climbing stairs?: No Weakness of Legs: None Weakness of Arms/Hands: None  Permission Sought/Granted                  Emotional Assessment Appearance:: (Patient assessed via telephone) Attitude/Demeanor/Rapport: Engaged   Orientation: : Oriented to Self, Oriented to Place, Oriented to  Time, Oriented to Situation   Psych Involvement: No (comment)  Admission diagnosis:  Acute respiratory failure (HCC) [J96.00] Acute kidney failure, unspecified ([place]) [N17.9] Chronic kidney disease, stage IV (severe) ([place]) [N18.4] Acute respiratory failure with hypoxia ([place]) [J96.01] Community acquired pneumonia, unspecified laterality [J18.9] Patient Active Problem List   Diagnosis Date Noted  . CAP (community acquired pneumonia) 06/21/2019  . Acute respiratory failure with hypoxia ([place]) 06/21/2019  . Venous ulcer of left leg ([place])  06/21/2019  . COPD with chronic bronchitis ([place]) 06/21/2019  . CKD (chronic kidney disease) stage 4, GFR 15-29 ml/min (HCC) 06/21/2019  . AKI (acute kidney injury) ([place]) 06/21/2019  . Chronic systolic CHF (congestive heart failure) ([place]) 06/21/2019  . History of 2019 novel coronavirus disease (COVID-19) 06/21/2019  . Anemia 06/21/2019  . Acute respiratory failure ([place]) 06/21/2019  . Hyperglycemia due to type 1 diabetes mellitus ([place]) 06/21/2019  . Hx of CABG 06/21/2019  . NSTEMI (non-ST elevated myocardial infarction) ([place]) 06/21/2019   PCP:  [place], [place]:   [facility] [address], [place] [address] [place] [address] Phone: [phone] Fax: [phone]     Social Determinants of Health (SDOH) Interventions    Readmission Risk Interventions No flowsheet data found.

## 2019-06-25 NOTE — Evaluation (Signed)
Occupational Therapy Evaluation Patient Details Name: Bryce Garcia MRN: [id number] DOB: 10-03-1950 Today's Date: 06/25/2019    History of Present Illness This 68 y/o male was adm for PNA post-covid. PMH includes:  Covid infection, COPD, CAD s/p 4 vessel CABG, DM, CHF, CKD4, EF 50%. Of note, Pt recieving HD via R femoral vein central line while in acute setting.   Clinical Impression   Pt seen for OT evaluation this date. Prior to hospital admission, pt was Indep with all ADLs/IADLs.  Pt lives alone in [facility] with son who checks on him periodically.  Currently pt demonstrates impairments in standing balance and tolerance as well as general strength for ADL transfers requiring MIN A with ADL transfers, fxl mobility and standing ADLs.  Pt would benefit from skilled OT to address noted impairments and functional limitations (see below for any additional details) in order to maximize safety and independence while minimizing falls risk and caregiver burden.  Upon hospital discharge, recommend pt discharge to home with [facility] and 24/7 supervision initially with downgrade to intermittent as appropriate.    Follow Up Recommendations  Home health OT;Supervision/Assistance - 24 hour(initial 24/7 supervision preferable and then intermittent after first 1-2 weeks following hospitalization)    Equipment Recommendations  3 in 1 bedside commode    Recommendations for Other Services       Precautions / Restrictions Precautions Precautions: Fall Precaution Comments: R femoral vein central line Restrictions Weight Bearing Restrictions: No      Mobility Bed Mobility               General bed mobility comments: Pt seated EOB when OT presents and requests to remain in EOB sitting at end of session, pt A&O, notified to use call bell for OOB activity  Transfers Overall transfer level: Needs assistance Equipment used: 1 person hand held assist Transfers: Sit to/from Stand Sit to Stand: Min assist               Balance Overall balance assessment: Needs assistance Sitting-balance support: Feet supported Sitting balance-Leahy Scale: Good     Standing balance support: Bilateral upper extremity supported;During functional activity Standing balance-Leahy Scale: Poor Standing balance comment: Pt requires UE support through FWW and MIN A from OT to maintain standing balance                           ADL either performed or assessed with clinical judgement   ADL Overall ADL's : Needs assistance/impaired Eating/Feeding: Modified independent;Sitting Eating/Feeding Details (indicate cue type and reason): increased time to open containers and packets Grooming: Wash/dry hands;Wash/dry face;Oral care;Modified independent;Sitting Grooming Details (indicate cue type and reason): increased time Upper Body Bathing: Set up;Sitting   Lower Body Bathing: Minimal assistance;Sit to/from stand Lower Body Bathing Details (indicate cue type and reason): MIN A to CTS Upper Body Dressing : Set up;Sitting   Lower Body Dressing: Minimal assistance;Sit to/from stand   Toilet Transfer: Minimal assistance;Stand-pivot;BSC   Toileting- Clothing Manipulation and Hygiene: Minimal assistance;Sit to/from stand       Functional mobility during ADLs: Minimal assistance;Cueing for safety(hand held assist to take 4-5 shuffling side steps at bed side to his left toward [facility].)       Vision Patient Visual Report: No change from baseline       Perception     Praxis      Pertinent Vitals/Pain Pain Assessment: No/denies pain     Hand Dominance Right   Extremity/Trunk Assessment  Upper Extremity Assessment Upper Extremity Assessment: Generalized weakness;RUE deficits/detail;LUE deficits/detail RUE Deficits / Details: Shoulder, elbow, grip MMT 4+/5 LUE Deficits / Details: shoulder, elbow, grip MMT 4-/5, hx L sided weakness following CVA LUE Coordination: decreased fine motor   Lower  Extremity Assessment Lower Extremity Assessment: Defer to PT evaluation;Generalized weakness   Cervical / Trunk Assessment Cervical / Trunk Assessment: Normal   Communication Communication Communication: No difficulties   Cognition Arousal/Alertness: Awake/alert Behavior During Therapy: WFL for tasks assessed/performed Overall Cognitive Status: Within Functional Limits for tasks assessed                                     General Comments       Exercises Other Exercises Other Exercises: OT facilitates education re: role of OT with pt verbalizing understanding Other Exercises: OT facilitates education re: importance of incentive spirometer use and recommendations for frequency Other Exercises: OT facilitates education re: safety and fall prevention-use of call bell for OOB activity.   Shoulder Instructions      Home Living Family/patient expects to be discharged to:: Private residence Living Arrangements: Alone Available Help at Discharge: Family;Available PRN/intermittently(states son can come stay with him for a week or so after d/c from hospital) Type of Home: House       Home Layout: One level                          Prior Functioning/Environment Level of Independence: Needs assistance;Independent  Gait / Transfers Assistance Needed: Pt was performing all asepcts of self care, IADLs and fxl mobility with no AD without assitance until 4 days prior to admission. ADL's / Homemaking Assistance Needed: Pt reports Indep with ADLs. In addition, endorses driving, being independent with housework including laundry. Reports performing all meal prep I'ly as well.   Comments: Pt endorses decline in fxn since recovery from COVID >4wks ago        OT Problem List: Decreased strength;Decreased range of motion;Decreased activity tolerance;Impaired balance (sitting and/or standing);Decreased knowledge of use of DME or AE;Impaired UE functional use      OT  Treatment/Interventions: Self-care/ADL training;Therapeutic exercise;Energy conservation;DME and/or AE instruction;Therapeutic activities;Patient/family education;Balance training    OT Goals(Current goals can be found in the care plan section) Acute Rehab OT Goals Patient Stated Goal: to get home and get stronger OT Goal Formulation: With patient Time For Goal Achievement: 07/09/19 Potential to Achieve Goals: Good  OT Frequency: Min 1X/week   Barriers to D/C: Decreased caregiver support          Co-evaluation              AM-PAC OT "6 Clicks" Daily Activity     Outcome Measure Help from another person eating meals?: None Help from another person taking care of personal grooming?: None Help from another person toileting, which includes using toliet, bedpan, or urinal?: A Little Help from another person bathing (including washing, rinsing, drying)?: A Little Help from another person to put on and taking off regular upper body clothing?: None Help from another person to put on and taking off regular lower body clothing?: A Little 6 Click Score: 21   End of Session Equipment Utilized During Treatment: Gait belt Nurse Communication: Mobility status  Activity Tolerance: Patient tolerated treatment well Patient left: with call bell/phone within reach(pt seated EOB with bed alarm off with call bell in reach when  OT presents for eval. Pt A&O x4. Requesting to remain in same position at end of session. OT notifies RN.)  OT Visit Diagnosis: Unsteadiness on feet (R26.81);Muscle weakness (generalized) (M62.81)                Time: [id number] OT Time Calculation (min): 38 min Charges:  OT General Charges $OT Visit: 1 Visit OT Evaluation $OT Eval Moderate Complexity: 1 Mod OT Treatments $Self Care/Home Management : 8-22 mins $Therapeutic Activity: 8-22 mins   Gerrianne Scale, MS, OTR/L ascom [phone] 06/25/19, 11:11 AM

## 2019-06-25 NOTE — Care Management Important Message (Signed)
Important Message  Patient Details  Name: Bryce Garcia MRN: [id number] Date of Birth: 1951-04-26   Medicare Important Message Given:  Yes     Dannette Barbara 06/25/2019, 10:56 AM

## 2019-06-25 NOTE — Progress Notes (Addendum)
Inpatient Diabetes Program Recommendations  AACE/ADA: New Consensus Statement on Inpatient Glycemic Control (2015)  Target Ranges:  Prepandial:   less than 140 mg/dL      Peak postprandial:   less than 180 mg/dL (1-2 hours)      Critically ill patients:  140 - 180 mg/dL   Results for SAAHIL, KUREK (MRN [id number]) as of 06/25/2019 10:34  Ref. Range 06/24/2019 11:39 06/24/2019 17:01 06/24/2019 21:37  Glucose-Capillary Latest Ref Range: 70 - 99 mg/dL 212 (H)  11 units NOVOLOG  261 (H)  15 units NOVOLOG  259 (H)  3 units NOVOLOG +  25 units LEVEMIR   Results for XAINE, BOYACK (MRN [id number]) as of 06/25/2019 10:34  Ref. Range 06/25/2019 08:01  Glucose-Capillary Latest Ref Range: 70 - 99 mg/dL 112 (H)  4 units NOVOLOG     Home DM Meds: NPH Insulin 5 units AM/ 19 units QHS       Regular Insulin 5-20 units TID with meals   Current Orders: Levemir 25 units QHS      Novolog Resistant Correction Scale/ SSI (0-20 units) TID AC + HS      Novolog 4 units TID with meals     MD- AM CBGs look fine.  Having issues with elevated afternoon CBGs.  Please consider increasing the Novolog Meal Coverage to Novolog 8 units TID with meals     --Will follow patient during hospitalization--  Wyn Quaker RN, MSN, CDE Diabetes Coordinator Inpatient Glycemic Control Team Team Pager: [phone] (8a-5p)

## 2019-06-25 NOTE — Progress Notes (Signed)
PROGRESS NOTE    Bryce Garcia  [id number] DOB: Nov 26, 1950 DOA: 06/21/2019 PCP: [facility]    Brief Narrative:  Bryce Garcia a 68 y.o.malewith medical history significanttype 1 diabetes, CAD status post CABG, systolic heart failure, last EF 50%, CKD 4, type 1 diabetes, hypertension, who had COVID-19 infection over 4 weeks ago from which he states he had completely resolved, who presents with a 4-day history of progressively worsening shortness of breath. Denies chest pain and denies lower extremity pain or swelling. He denies having a cough or fever. Denies nausea vomiting or diaphoresis, denies abdominal pain or change in bowel habits. On arrival in the emergency room his O2 sat was 68% on room air, improving to 94% on 4 L. Temperature was 99 with blood pressure 157/62 heart rate 83 and respirations 18/min. His chest x-ray showed multifocal airspace opacities with consolidation of the left midlung. On his blood work BNP was elevated at 1167. EKG showed no acute ST-T wave changes. Other blood work remarkable for creatinine of 3.88 up from his baseline of 2.87, blood sugar of 233, hemoglobin of 9.1, down from baseline of 11.7 and white cell count of 12.5. Baseline labs were obtained from review of care everywhere from [facility] where patient gets most of his care. RepeatCovid test in the ER was negative. She was started on Rocephin and azithromycin and hospitalization requested. Status discussed and patient elects to be DNR,was confirmed with son on phone.    Consultants:   Cardiology, nephrology  Procedures:  Echo 12/24 1. Left ventricular ejection fraction, by visual estimation, is 50 to 55%. The left ventricle has normal function. There is no left ventricular hypertrophy.  2. Left ventricular diastolic parameters are consistent with Grade II diastolic dysfunction (pseudonormalization).  3. The left ventricle has no regional  wall motion abnormalities.  4. Global right ventricle has normal systolic function.The right ventricular size is normal. No increase in right ventricular wall thickness.  5. Left atrial size was mildly dilated.  6. Moderate mitral valve regurgitation.  7. TR signal is inadequate for assessing pulmonary artery systolic pressure.  Antimicrobials:   Azithromycin and ceftriaxone    Subjective: Reports feeling better. Denies cough, fever, chills or any other sx.  Objective: Vitals:   06/24/19 2146 06/25/19 0557 06/25/19 0832 06/25/19 1230  BP: (!) 155/67 (!) 149/75 (!) 158/68 138/74  Pulse: 75 75 68 69  Resp: 19 18 18 15   Temp: 98.1 F (36.7 C) 98 F (36.7 C) 98.8 F (37.1 C) (!) 97.4 F (36.3 C)  TempSrc: Oral Oral Oral Oral  SpO2: 96% 95% 96% 94%  Weight:      Height:        Intake/Output Summary (Last 24 hours) at 06/25/2019 1417 Last data filed at 06/25/2019 1020 Gross per 24 hour  Intake 240 ml  Output 1075 ml  Net -835 ml   Filed Weights   06/21/19 0150 06/23/19 1530  Weight: 88.5 kg 89.5 kg    Examination: General exam: Appears calm and comfortable, NAD,nontachypnic Respiratory system: minimal scattered crackles, +rhonchi minimal, no wheezing Cardiovascular system: S1 & S2 heard, RRR.  No murmurs, rubs, gallops. Gastrointestinal system: Abdomen NT/ND, +bs Central nervous system: Alert and oriented x3. No focal neurological deficits. Extremities: bilateral  Edema x4 extremities, Lower >upper, Lower Ext edema up to mid thigh Skin: Warm dry Psychiatry: Judgement and insight appear normal. Mood & affect appropriate.      Data Reviewed: I have personally  reviewed following labs and imaging studies  CBC: Recent Labs  Lab 06/21/19 0154 06/22/19 0506 06/23/19 0630 06/24/19 0632 06/25/19 0718  WBC 12.5* 13.3* 19.4* 13.4* 10.2  NEUTROABS 10.2*  --   --   --   --   HGB 9.1* 8.6* 9.1* 8.8* 8.3*  HCT 28.3* 26.4* 26.8* 26.6* 24.8*  MCV 90.4 90.1 87.0 90.2  87.9  PLT 349 350 375 366 [id number]   Basic Metabolic Panel: Recent Labs  Lab 06/21/19 0154 06/22/19 0506 06/23/19 0317 06/24/19 0633 06/25/19 0718  NA 139 132* 136 138 139  K 4.8 4.9 4.5 4.1 3.8  CL 104 100 102 103 105  CO2 21* 22 18* 23 23  GLUCOSE 233* 417* 118* 141* 127*  BUN 55* 82* 90* 65* 64*  CREATININE 3.88* 4.53* 4.62* 3.40* 3.36*  CALCIUM 8.7* 8.7* 8.4* 8.1* 8.1*  PHOS  --   --   --  4.1 4.1   GFR: Estimated Creatinine Clearance: 23.5 mL/min (A) (by C-G formula based on SCr of 3.36 mg/dL (H)). Liver Function Tests: Recent Labs  Lab 06/24/19 [id number] 06/25/19 0718  ALBUMIN 2.7* 2.4*   No results for input(s): LIPASE, AMYLASE in the last 168 hours. No results for input(s): AMMONIA in the last 168 hours. Coagulation Profile: Recent Labs  Lab 06/21/19 0155  INR 1.0   Cardiac Enzymes: No results for input(s): CKTOTAL, CKMB, CKMBINDEX, TROPONINI in the last 168 hours. BNP (last 3 results) No results for input(s): PROBNP in the last 8760 hours. HbA1C: No results for input(s): HGBA1C in the last 72 hours. CBG: Recent Labs  Lab 06/24/19 1139 06/24/19 1701 06/24/19 2137 06/25/19 0801 06/25/19 1231  GLUCAP 212* 261* 259* 112* 205*   Lipid Profile: No results for input(s): CHOL, HDL, LDLCALC, TRIG, CHOLHDL, LDLDIRECT in the last 72 hours. Thyroid Function Tests: No results for input(s): TSH, T4TOTAL, FREET4, T3FREE, THYROIDAB in the last 72 hours. Anemia Panel: No results for input(s): VITAMINB12, FOLATE, FERRITIN, TIBC, IRON, RETICCTPCT in the last 72 hours. Sepsis Labs: Recent Labs  Lab 06/21/19 0155 06/21/19 0412 06/21/19 0433  PROCALCITON  --   --  0.30  LATICACIDVEN 1.4 0.8  --     Recent Results (from the past 240 hour(s))  Blood Culture (routine x 2)     Status: None (Preliminary result)   Collection Time: 06/21/19  2:08 AM   Specimen: BLOOD  Result Value Ref Range Status   Specimen Description BLOOD BLOOD RIGHT FOREARM  Final   Special Requests    Final    BOTTLES DRAWN AEROBIC AND ANAEROBIC Blood Culture adequate volume   Culture   Final    NO GROWTH 4 DAYS Performed at [name], [address] [place], [place] [address]    Report Status PENDING  Incomplete  Blood Culture (routine x 2)     Status: None (Preliminary result)   Collection Time: 06/21/19  2:13 AM   Specimen: BLOOD  Result Value Ref Range Status   Specimen Description BLOOD LEFT ANTECUBITAL  Final   Special Requests   Final    BOTTLES DRAWN AEROBIC AND ANAEROBIC Blood Culture results may not be optimal due to an inadequate volume of blood received in culture bottles   Culture   Final    NO GROWTH 4 DAYS Performed at [facility], [address], [place], [place] [address]    Report Status PENDING  Incomplete  Respiratory Panel by PCR     Status: None   Collection  Time: 06/21/19 11:56 AM   Specimen: Nasopharyngeal Swab; Respiratory  Result Value Ref Range Status   Adenovirus NOT DETECTED NOT DETECTED Final   Coronavirus 229E NOT DETECTED NOT DETECTED Final    Comment: (NOTE) The Coronavirus on the Respiratory Panel, DOES NOT test for the novel  Coronavirus (2019 nCoV)    Coronavirus HKU1 NOT DETECTED NOT DETECTED Final   Coronavirus NL63 NOT DETECTED NOT DETECTED Final   Coronavirus OC43 NOT DETECTED NOT DETECTED Final   Metapneumovirus NOT DETECTED NOT DETECTED Final   Rhinovirus / Enterovirus NOT DETECTED NOT DETECTED Final   Influenza A NOT DETECTED NOT DETECTED Final   Influenza B NOT DETECTED NOT DETECTED Final   Parainfluenza Virus 1 NOT DETECTED NOT DETECTED Final   Parainfluenza Virus 2 NOT DETECTED NOT DETECTED Final   Parainfluenza Virus 3 NOT DETECTED NOT DETECTED Final   Parainfluenza Virus 4 NOT DETECTED NOT DETECTED Final   Respiratory Syncytial Virus NOT DETECTED NOT DETECTED Final   Bordetella pertussis NOT DETECTED NOT DETECTED Final   Chlamydophila pneumoniae NOT DETECTED NOT DETECTED Final   Mycoplasma  pneumoniae NOT DETECTED NOT DETECTED Final    Comment: Performed at [facility], [place]. [address], [place], [place] [address]  Urine culture     Status: None   Collection Time: 06/23/19  9:55 AM   Specimen: In/Out Cath Urine  Result Value Ref Range Status   Specimen Description   Final    IN/OUT CATH URINE Performed at [facility], [address] [place], [place] [address]    Special Requests   Final    NONE Performed at [facility], [address], [place], [place] [address]    Culture   Final    NO GROWTH Performed at [facility], [place] [address], [place], [place] [address]    Report Status 06/25/2019 FINAL  Final         Radiology Studies: No results found.      Scheduled Meds: . albuterol  2 puff Inhalation Q6H  . amLODipine  10 mg Oral Daily  . aspirin EC  81 mg Oral Daily  . atorvastatin  80 mg Oral Daily  . carvedilol  25 mg Oral BID  . Chlorhexidine Gluconate Cloth  6 each Topical Daily  . DULoxetine  30 mg Oral Daily  . furosemide  40 mg Intravenous BID  . insulin aspart  0-20 Units Subcutaneous TID WC  . insulin aspart  0-5 Units Subcutaneous QHS  . insulin aspart  4 Units Subcutaneous TID WC  . insulin detemir  25 Units Subcutaneous QHS  . levothyroxine  100 mcg Oral Daily  . pentoxifylline  400 mg Oral TID  . tuberculin  5 Units Intradermal Once   Continuous Infusions: . cefTRIAXone (ROCEPHIN)  IV 2 g (06/24/19 1815)  . heparin 1,800 Units/hr (06/25/19 1045)    Assessment & Plan:   Principal Problem:   Acute respiratory failure with hypoxia (HCC) Active Problems:   CAP (community acquired pneumonia)   Venous ulcer of left leg (HCC)   COPD with chronic bronchitis (HCC)   CKD (chronic kidney disease) stage 4, GFR 15-29 ml/min (HCC)   AKI (acute kidney injury) ([place])   Chronic systolic CHF (congestive heart failure) ([place])   History of 2019 novel coronavirus disease (COVID-19)   Anemia   Acute respiratory  failure (HCC)   Hyperglycemia due to type 1 diabetes mellitus ([place])   Hx of CABG   NSTEMI (non-ST  elevated myocardial infarction) ([place])   NSTEMI --In setting of acute respiratory distress and pneumonia  elevated troponin Echo with EF of 50 to 55%, grossly no wall motion abnormality Cardiology following -recommend continuing heparin until tomorrow to finish 48 hours.   Needs ischemic w/u however not cardiac cath as renal function worsening at this time.  Maybe after HD starts or pharmacologic myoview Continue with beta-blockers and aspirin, statin   Acute respiratory failure with hypoxia (HCC) --Most likely related to community-acquired pneumonia and CHF exacerbation. Imroving with abx and start of HD (first HD on 12/26) --O2 sat was 68% on room air on arrival improving to 94% on 4 L patient has no prior history of home oxygen use --Supplemental oxygen to keep sats over 90% Stopped Decadron as he does not require at this time Continue  IV antibiotics  HD evaluation daily-nephrology following.Marland Kitchen>No HD today Try to wean off 02 if can Restart lasix 40 iv bid per nephrology Monitor UO   Community acquired pneumonia in the setting of recent history of COVID-19 infection about 4 weeks ago-- --Patient has no prior history of supplemental oxygen use ---Chest x-ray showing multifocal pneumonia with consolidative opacity in the left midlung --Repeat Covid test /respiratory panel negative --IV Rocephin and azithromycin for 5 to 7 days --Albuterol every 6 hours and as needed --Supplemental oxygen to keep sats over 90% --Blood cultures pending.-so far negative.  -respiratory panel negative  Acute onChronic Diastolic and systolic CHF (congestive heart failure) ([place]) --Patient followed at [facility].Last EF on file was 50%on 3/2020improved from 40% Echo here EF 50-55% with Grade II diastolic dysfunction. -- Chest x-ray shows small bilateral pleural effusion --Appeared volume  overloaded, started on  Lasix IV- with worsening of renal function and not great response with diuretics, improving with temporary HD. Will resume lasix today 12/28 Strict I/O Daily weight   Anemia,uncertain acuity-likely ACKD --Hemoglobin 9.1. Most hemoglobin on file on chart everywhere was 11.7 --Suspect related to worsening kidney function --Stool for occult blood to evaluate for GI etiology-pending --Monitor H&H  Hyperglycemia due to type 1 diabetes mellitus ([place]) Decadron stopped Now BG better continue Lantus to 25Units . continue novolog to 4u with meals carb control  Diet with low sodium HA1C 6.8   Venous ulcer of left leg (HCC) --Recently seen by PCP on 06/19/2019 --Daily wound care  COPD with chronic bronchitis ([place]) --Does not appear to be acutely exacerbated --Albuterol prn   Acute kidney injury superimposed onCKD (chronic kidney disease) stage 4, GFR 15-29 ml/min (HCC) with metabolic acidosis --BUN creatinine of55/3.88,most recent creatinine of 2.85 in June 2020 per Care Everywhere Was worsening, despite diuretic.  Nephrology started temporary HD , first on 12/26. Evaluate daily for need of HD. Dialysis catheter placed on 12/26.   history ofCABG --Patient has history of CABG in 2005 -Continue with aspirin, statin, beta-blockers   PT-recommend SNF  DVT prophylaxis Heparin Code Status: DNR Family Communication: None at bedside Disposition Plan: SNF on d/c. Will likely be here 2 [place] days as he is not medically stable.     LOS: 4 days   Time spent: 45 minutes with more than 50% COC    Nolberto Hanlon, MD [facility] Pager [phone]  If 7PM-7AM, please contact night-coverage [email or web address] Password [facility] 06/25/2019, 2:17 PM Patient ID: CHARLESON MIKULICH, male   DOB: January 26, 1951, 68 y.o.   MRN: [id number] Patient ID: SIG CLISHAM, male   DOB: 06/20/1951, 68 y.o.   MRN: [id number]

## 2019-06-25 NOTE — Progress Notes (Signed)
[facility]  ROUNDING NOTE   Subjective:   Patient is doing fair today Sitting up on the side of bed.  Denies any nausea or vomiting States his appetite is improving Continues to have large amount of lower extremity edema Breathing is fair, recently had bronchodilator treatments   Objective:  Vital signs in last 24 hours:  Temp:  [98 F (36.7 C)-98.8 F (37.1 C)] 98.8 F (37.1 C) (12/28 0832) Pulse Rate:  [68-75] 68 (12/28 0832) Resp:  [18-19] 18 (12/28 0832) BP: (149-158)/(67-75) 158/68 (12/28 0832) SpO2:  [95 %-96 %] 96 % (12/28 0832)  Weight change:  Filed Weights   06/21/19 0150 06/23/19 1530  Weight: 88.5 kg 89.5 kg    Intake/Output: I/O last 3 completed shifts: In: 340 [P.O.:240; IV Piggyback:100] Out: [id number] [Urine:1375]   Intake/Output this shift:  Total I/O In: -  Out: 200 [Urine:200]  Physical Exam: General: NAD, sitting up in side of bed  Head: Normocephalic, atraumatic. Moist oral mucosal membranes  Eyes: Anicteric,   Lungs:  B/l mid rhonchi  Heart: Regular rate and rhythm, 2/6 murmur  Abdomen:  Soft, nontender,   Extremities: 2+  peripheral edema up to lower thighs  Neurologic:  Alert, oriented  Skin: Left lower extremity ulcer with clean dressings   Access Right temp femoral HD catheter 12/26 Dr. Mortimer Fries    Basic Metabolic Panel: Recent Labs  Lab 06/21/19 0154 06/22/19 0506 06/23/19 0317 06/24/19 0633 06/25/19 0718  NA 139 132* 136 138 139  K 4.8 4.9 4.5 4.1 3.8  CL 104 100 102 103 105  CO2 21* 22 18* 23 23  GLUCOSE 233* 417* 118* 141* 127*  BUN 55* 82* 90* 65* 64*  CREATININE 3.88* 4.53* 4.62* 3.40* 3.36*  CALCIUM 8.7* 8.7* 8.4* 8.1* 8.1*  PHOS  --   --   --  4.1 4.1    Liver Function Tests: Recent Labs  Lab 06/24/19 0633 06/25/19 0718  ALBUMIN 2.7* 2.4*   No results for input(s): LIPASE, AMYLASE in the last 168 hours. No results for input(s): AMMONIA in the last 168 hours.  CBC: Recent Labs  Lab 06/21/19 0154  06/22/19 0506 06/23/19 0630 06/24/19 0632 06/25/19 0718  WBC 12.5* 13.3* 19.4* 13.4* 10.2  NEUTROABS 10.2*  --   --   --   --   HGB 9.1* 8.6* 9.1* 8.8* 8.3*  HCT 28.3* 26.4* 26.8* 26.6* 24.8*  MCV 90.4 90.1 87.0 90.2 87.9  PLT 349 350 375 366 336    Cardiac Enzymes: No results for input(s): CKTOTAL, CKMB, CKMBINDEX, TROPONINI in the last 168 hours.  BNP: Invalid input(s): POCBNP  CBG: Recent Labs  Lab 06/23/19 2129 06/24/19 1139 06/24/19 1701 06/24/19 2137 06/25/19 0801  GLUCAP 213* 212* 261* 259* 112*    Microbiology: Results for orders placed or performed during the hospital encounter of 06/21/19  Blood Culture (routine x 2)     Status: None (Preliminary result)   Collection Time: 06/21/19  2:08 AM   Specimen: BLOOD  Result Value Ref Range Status   Specimen Description BLOOD BLOOD RIGHT FOREARM  Final   Special Requests   Final    BOTTLES DRAWN AEROBIC AND ANAEROBIC Blood Culture adequate volume   Culture   Final    NO GROWTH 4 DAYS Performed at [facility], [address] [place], [place] [address]    Report Status PENDING  Incomplete  Blood Culture (routine x 2)     Status: None (Preliminary result)   Collection  Time: 06/21/19  2:13 AM   Specimen: BLOOD  Result Value Ref Range Status   Specimen Description BLOOD LEFT ANTECUBITAL  Final   Special Requests   Final    BOTTLES DRAWN AEROBIC AND ANAEROBIC Blood Culture results may not be optimal due to an inadequate volume of blood received in culture bottles   Culture   Final    NO GROWTH 4 DAYS Performed at [facility], [address] [place], [place] [address]    Report Status PENDING  Incomplete  Respiratory Panel by PCR     Status: None   Collection Time: 06/21/19 11:56 AM   Specimen: Nasopharyngeal Swab; Respiratory  Result Value Ref Range Status   Adenovirus NOT DETECTED NOT DETECTED Final   Coronavirus 229E NOT DETECTED NOT DETECTED Final    Comment: (NOTE) The Coronavirus  on the Respiratory Panel, DOES NOT test for the novel  Coronavirus (2019 nCoV)    Coronavirus HKU1 NOT DETECTED NOT DETECTED Final   Coronavirus NL63 NOT DETECTED NOT DETECTED Final   Coronavirus OC43 NOT DETECTED NOT DETECTED Final   Metapneumovirus NOT DETECTED NOT DETECTED Final   Rhinovirus / Enterovirus NOT DETECTED NOT DETECTED Final   Influenza A NOT DETECTED NOT DETECTED Final   Influenza B NOT DETECTED NOT DETECTED Final   Parainfluenza Virus 1 NOT DETECTED NOT DETECTED Final   Parainfluenza Virus 2 NOT DETECTED NOT DETECTED Final   Parainfluenza Virus 3 NOT DETECTED NOT DETECTED Final   Parainfluenza Virus 4 NOT DETECTED NOT DETECTED Final   Respiratory Syncytial Virus NOT DETECTED NOT DETECTED Final   Bordetella pertussis NOT DETECTED NOT DETECTED Final   Chlamydophila pneumoniae NOT DETECTED NOT DETECTED Final   Mycoplasma pneumoniae NOT DETECTED NOT DETECTED Final    Comment: Performed at [facility], [place]. [address] [place], [place] [address]  Urine culture     Status: None   Collection Time: 06/23/19  9:55 AM   Specimen: In/Out Cath Urine  Result Value Ref Range Status   Specimen Description   Final    IN/OUT CATH URINE Performed at [facility], [address], [place], [place] [address]    Special Requests   Final    NONE Performed at [facility], [address], [place], [place] [address]    Culture   Final    NO GROWTH Performed at [facility], [place] [address], [place], [place] [address]    Report Status 06/25/2019 FINAL  Final    Coagulation Studies: No results for input(s): LABPROT, INR in the last 72 hours.  Urinalysis: No results for input(s): COLORURINE, LABSPEC, PHURINE, GLUCOSEU, HGBUR, BILIRUBINUR, KETONESUR, PROTEINUR, UROBILINOGEN, NITRITE, LEUKOCYTESUR in the last 72 hours.  Invalid input(s): APPERANCEUR    Imaging: No results found.   Medications:   . cefTRIAXone (ROCEPHIN)  IV 2 g (06/24/19 1815)   . heparin 1,800 Units/hr (06/25/19 1045)   . albuterol  2 puff Inhalation Q6H  . amLODipine  10 mg Oral Daily  . aspirin EC  81 mg Oral Daily  . atorvastatin  80 mg Oral Daily  . carvedilol  25 mg Oral BID  . Chlorhexidine Gluconate Cloth  6 each Topical Daily  . DULoxetine  30 mg Oral Daily  . insulin aspart  0-20 Units Subcutaneous TID WC  . insulin aspart  0-5 Units Subcutaneous QHS  . insulin aspart  4 Units Subcutaneous TID WC  . insulin detemir  25 Units Subcutaneous QHS  .  levothyroxine  100 mcg Oral Daily  . pentoxifylline  400 mg Oral TID  . tuberculin  5 Units Intradermal Once   alum & mag hydroxide-simeth, ipratropium-albuterol, traMADol, traZODone  Assessment/ Plan:  Mr. THAILER MUCKER is a 68 y.o. white male with insulin dependent diabetes mellitus type II, hypertension, coronary artery disease status post CABG, COPD, COVID-19 infection 04/2019, chronic venous stasis ulcer, , who was admitted to [facility] on 06/21/2019 for acute exacerbation of systolic congestive heart failure. Echocardiogram 3/8 at [facility] with EF of 50%.   1. Acute kidney failure on chronic kidney disease stage IV with proteinuria: baseline creatinine of 2.85, GFR of 22 on 12/18/18.  Chronic kidney disease secondary to diabetic nephropathy. Followed by [facility], Dr. Alfonse Alpers Acute kidney injury secondary to acute cardiorenal syndrome. Not on an ACE-I/ARB at home.  - Patient requiring temporary dialysis. First treatment on 12/16. No indication for dialysis today.  Urine output has improved. Evaluate daily for dialysis need.   2. Hypertension with acute exacerbation of diastolic congestive heart failure.  Significant lower extremity edema/volume overload -Recommend restarting IV furosemide.  3. Anemia with chronic kidney disease: normocytic.  Lab Results  Component Value Date   HGB 8.3 (L) 06/25/2019       LOS: 4 Trae Bovenzi 12/28/202012:05 PM

## 2019-06-25 NOTE — Progress Notes (Signed)
Progress Note  Patient Name: Bryce Garcia Date of Encounter: 06/25/2019  Primary Cardiologist: [facility]  Subjective   Breathing much improved today though not back to baseline.  No chest pain or palpitations.  Some lower extremity edema still present.  No plans for dialysis today.  Inpatient Medications    Scheduled Meds: . albuterol  2 puff Inhalation Q6H  . amLODipine  10 mg Oral Daily  . aspirin EC  81 mg Oral Daily  . atorvastatin  80 mg Oral Daily  . carvedilol  25 mg Oral BID  . Chlorhexidine Gluconate Cloth  6 each Topical Daily  . DULoxetine  30 mg Oral Daily  . furosemide  40 mg Intravenous BID  . insulin aspart  0-20 Units Subcutaneous TID WC  . insulin aspart  0-5 Units Subcutaneous QHS  . insulin aspart  4 Units Subcutaneous TID WC  . insulin detemir  25 Units Subcutaneous QHS  . levothyroxine  100 mcg Oral Daily  . pentoxifylline  400 mg Oral TID  . tuberculin  5 Units Intradermal Once   Continuous Infusions: . cefTRIAXone (ROCEPHIN)  IV 2 g (06/24/19 1815)  . heparin 1,800 Units/hr (06/25/19 1045)   PRN Meds: alum & mag hydroxide-simeth, ipratropium-albuterol, traMADol, traZODone   Vital Signs    Vitals:   06/24/19 2146 06/25/19 0557 06/25/19 0832 06/25/19 1230  BP: (!) 155/67 (!) 149/75 (!) 158/68 138/74  Pulse: 75 75 68 69  Resp: 19 18 18 15   Temp: 98.1 F (36.7 C) 98 F (36.7 C) 98.8 F (37.1 C) (!) 97.4 F (36.3 C)  TempSrc: Oral Oral Oral Oral  SpO2: 96% 95% 96% 94%  Weight:      Height:        Intake/Output Summary (Last 24 hours) at 06/25/2019 1529 Last data filed at 06/25/2019 1020 Gross per 24 hour  Intake 240 ml  Output 1075 ml  Net -835 ml   Last 3 Weights 06/23/2019 06/21/2019 01/15/2016  Weight (lbs) 197 lb 5 oz 195 lb 182 lb  Weight (kg) 89.5 kg 88.451 kg 82.555 kg      Telemetry    Normal sinus rhythm - Personally Reviewed  ECG    No new tracing  Physical Exam   GEN: No acute distress.   Neck:  JVP of 6 to 8  cm. Cardiac: RRR, no murmurs, rubs, or gallops.  Respiratory:  Coarse breath sounds with scattered rhonchi.  No wheezes or crackles. GI: Soft, nontender, non-distended  MS:  1+ pretibial edema bilaterally; No deformity. Neuro:  Nonfocal  Psych: Normal affect   Labs    High Sensitivity Troponin:   Recent Labs  Lab 06/21/19 0156 06/21/19 0412 06/21/19 0433 06/21/19 1439  TROPONINIHS 1,369* 2,780* 5,300* 8,795*      Chemistry Recent Labs  Lab 06/23/19 0317 06/24/19 0633 06/25/19 0718  NA 136 138 139  K 4.5 4.1 3.8  CL 102 103 105  CO2 18* 23 23  GLUCOSE 118* 141* 127*  BUN 90* 65* 64*  CREATININE 4.62* 3.40* 3.36*  CALCIUM 8.4* 8.1* 8.1*  ALBUMIN  --  2.7* 2.4*  GFRNONAA 12* 18* 18*  GFRAA 14* 21* 21*  ANIONGAP 16* 12 11     Hematology Recent Labs  Lab 06/23/19 0630 06/24/19 0632 06/25/19 0718  WBC 19.4* 13.4* 10.2  RBC 3.08* 2.95* 2.82*  HGB 9.1* 8.8* 8.3*  HCT 26.8* 26.6* 24.8*  MCV 87.0 90.2 87.9  MCH 29.5 29.8 29.4  MCHC 34.0 33.1 33.5  RDW 13.9 14.2 14.3  PLT 375 366 336    BNP Recent Labs  Lab 06/21/19 0155  BNP 1,167.0*     DDimer No results for input(s): DDIMER in the last 168 hours.   Radiology    No results found.  Cardiac Studies   TTE (06/21/2019):  1. Left ventricular ejection fraction, by visual estimation, is 50 to 55%. The left ventricle has normal function. There is no left ventricular hypertrophy.  2. Left ventricular diastolic parameters are consistent with Grade II diastolic dysfunction (pseudonormalization).  3. The left ventricle has no regional wall motion abnormalities.  4. Global right ventricle has normal systolic function.The right ventricular size is normal. No increase in right ventricular wall thickness.  5. Left atrial size was mildly dilated.  6. Moderate mitral valve regurgitation.  7. TR signal is inadequate for assessing pulmonary artery systolic pressure.  Patient Profile     68 y.o. male history of  CAD status post CABG and PCI, CKD stage III, diabetes mellitus, COPD, and COVID-19 infection in 04/2019, admitted with worsening shortness of breath.  Assessment & Plan    NSTEMI: No chest pain reported, though Mr. Dupin has experienced considerable dyspnea during this admission.  High-sensitivity troponin still uptrending on last check in the afternoon of 12/24 (HS-TnI 8,795).  Catheterization deferred in the setting of acute on chronic renal insufficiency.  Unclear if troponin elevation and dyspnea are due to acute plaque rupture MI versus demand ischemia.  Approximately 4 days of IV heparin have been completed; I think it is reasonable to discontinue therapeutic heparin at this time.  Consider ischemia evaluation in the future.  Based on renal function, catheterization versus myocardial perfusion stress testing will need to be discussed.  Mr. Dircks wishes to pursue this through his primary cardiologist at [facility], which I think is reasonable, as his tenuous pulmonary and renal status increase the risk of stress testing and/or catheterization at this time.  Defer addition of clopidogrel in the setting of anemia.  Continue to trend hemoglobin and consider PRBC transfusion for worsening symptoms, objective signs of ongoing ischemia, or hemoglobin below 8.  Continue statin for secondary prevention.  Carvedilol and amlodipine for BP and anginal therapy.  Acute on chronic HFpEF: Mr. Berkshire still appears somewhat volume overloaded, though breathing has improved with dialysis yesterday.  No plans for dialysis today, with daily clinical reassessment to determine the need for further hemodialysis.  Furosemide ordered by nephrology this morning.  I agree with a trial of furosemide, which has been ordered at 40 mg IV twice daily.  Dose escalation may be necessary based on urine output response.  Acute on chronic kidney disease: Concern for acute tubular necrosis in the setting of acute hypoxic respiratory  failure.  Patient received hemodialysis yesterday.  Dialysis management per nephrology.  Avoid nephrotoxic drugs.  For questions or updates, please contact [place] Please consult [email or web address] for contact info under [facility].     Signed, Nelva Bush, MD  06/25/2019, 3:29 PM

## 2019-06-26 LAB — GLUCOSE, CAPILLARY
Glucose-Capillary: 149 mg/dL — ABNORMAL HIGH (ref 70–99)
Glucose-Capillary: 216 mg/dL — ABNORMAL HIGH (ref 70–99)
Glucose-Capillary: 276 mg/dL — ABNORMAL HIGH (ref 70–99)
Glucose-Capillary: 238 mg/dL — ABNORMAL HIGH (ref 70–99)

## 2019-06-26 LAB — BASIC METABOLIC PANEL
Anion gap: 11 (ref 5–15)
BUN: 64 mg/dL — ABNORMAL HIGH (ref 8–23)
CO2: 23 mmol/L (ref 22–32)
Calcium: 8.4 mg/dL — ABNORMAL LOW (ref 8.9–10.3)
Chloride: 104 mmol/L (ref 98–111)
Creatinine, Ser: 3.52 mg/dL — ABNORMAL HIGH (ref 0.61–1.24)
GFR calc Af Amer: 20 mL/min — ABNORMAL LOW (ref >60)
GFR calc non Af Amer: 17 mL/min — ABNORMAL LOW (ref >60)
Glucose, Bld: 188 mg/dL — ABNORMAL HIGH (ref 70–99)
Potassium: 4.3 mmol/L (ref 3.5–5.1)
Sodium: 138 mmol/L (ref 135–145)

## 2019-06-26 LAB — CBC
HCT: 24.8 % — ABNORMAL LOW (ref 39.0–52.0)
Hemoglobin: 8.1 g/dL — ABNORMAL LOW (ref 13.0–17.0)
MCH: 29.3 pg (ref 26.0–34.0)
MCHC: 32.7 g/dL (ref 30.0–36.0)
MCV: 89.9 fL (ref 80.0–100.0)
Platelets: 356 10*3/uL (ref 150–400)
RBC: 2.76 MIL/uL — ABNORMAL LOW (ref 4.22–5.81)
RDW: 14.3 % (ref 11.5–15.5)
WBC: 10.3 10*3/uL (ref 4.0–10.5)
nRBC: 0 % (ref 0.0–0.2)

## 2019-06-26 LAB — IRON AND TIBC
Iron: 23 ug/dL — ABNORMAL LOW (ref 45–182)
Saturation Ratios: 15 % — ABNORMAL LOW (ref 17.9–39.5)
TIBC: 152 ug/dL — ABNORMAL LOW (ref 250–450)
UIBC: 129 ug/dL

## 2019-06-26 LAB — CULTURE, BLOOD (ROUTINE X 2)
Culture: NO GROWTH
Culture: NO GROWTH
Special Requests: ADEQUATE

## 2019-06-26 LAB — FERRITIN: Ferritin: 124 ng/mL (ref 24–336)

## 2019-06-26 MED ORDER — TAB-A-VITE/IRON PO TABS
1.0000 | ORAL_TABLET | Freq: Every day | ORAL | Status: DC
  Administered 2019-06-26 – 2019-06-28 (×3): 1 via ORAL
  Filled 2019-06-26 (×3): qty 1

## 2019-06-26 MED ORDER — TORSEMIDE 20 MG PO TABS
40.0000 mg | ORAL_TABLET | Freq: Every day | ORAL | Status: DC
  Administered 2019-06-27 – 2019-06-28 (×2): 40 mg via ORAL
  Filled 2019-06-26 (×2): qty 2

## 2019-06-26 NOTE — Progress Notes (Signed)
[facility]  ROUNDING NOTE   Subjective:   Patient is doing fair today Sitting up on the side of bed.  Denies any nausea or vomiting States his appetite is good Urine output has increased with IV Lasix administration Patient does report some difficulty and hesitancy with voiding No shortness of breath   Objective:  Vital signs in last 24 hours:  Temp:  [97.4 F (36.3 C)-98.1 F (36.7 C)] 98.1 F (36.7 C) (12/29 0421) Pulse Rate:  [69-74] 69 (12/29 0421) Resp:  [15-20] 20 (12/29 0421) BP: (138-155)/(64-74) 148/64 (12/29 0421) SpO2:  [94 %-98 %] 98 % (12/29 0421)  Weight change:  Filed Weights   06/21/19 0150 06/23/19 1530  Weight: 88.5 kg 89.5 kg    Intake/Output: I/O last 3 completed shifts: In: 240 [P.O.:240] Out: 2825 [Urine:2825]   Intake/Output this shift:  No intake/output data recorded.  Physical Exam: General: NAD, sitting up in side of bed  Head: Normocephalic, atraumatic. Moist oral mucosal membranes  Eyes: Anicteric,   Lungs:   Clear to auscultation bilaterally  Heart: Regular rate and rhythm, 2/6 murmur  Abdomen:  Soft, nontender,   Extremities:  peripheral edema up to knees  Neurologic:  Alert, oriented  Skin: Left lower extremity ulcer    Access Right temp femoral HD catheter 12/26 Dr. Mortimer Fries    Basic Metabolic Panel: Recent Labs  Lab 06/22/19 0506 06/23/19 0317 06/24/19 0633 06/25/19 0718 06/26/19 0309  NA 132* 136 138 139 138  K 4.9 4.5 4.1 3.8 4.3  CL 100 102 103 105 104  CO2 22 18* 23 23 23   GLUCOSE 417* 118* 141* 127* 188*  BUN 82* 90* 65* 64* 64*  CREATININE 4.53* 4.62* 3.40* 3.36* 3.52*  CALCIUM 8.7* 8.4* 8.1* 8.1* 8.4*  PHOS  --   --  4.1 4.1  --     Liver Function Tests: Recent Labs  Lab 06/24/19 0633 06/25/19 0718  ALBUMIN 2.7* 2.4*   No results for input(s): LIPASE, AMYLASE in the last 168 hours. No results for input(s): AMMONIA in the last 168 hours.  CBC: Recent Labs  Lab 06/21/19 0154  06/22/19 0506 06/23/19 0630 06/24/19 0632 06/25/19 0718 06/26/19 0309  WBC 12.5* 13.3* 19.4* 13.4* 10.2 10.3  NEUTROABS 10.2*  --   --   --   --   --   HGB 9.1* 8.6* 9.1* 8.8* 8.3* 8.1*  HCT 28.3* 26.4* 26.8* 26.6* 24.8* 24.8*  MCV 90.4 90.1 87.0 90.2 87.9 89.9  PLT 349 350 375 366 336 356    Cardiac Enzymes: No results for input(s): CKTOTAL, CKMB, CKMBINDEX, TROPONINI in the last 168 hours.  BNP: Invalid input(s): POCBNP  CBG: Recent Labs  Lab 06/25/19 0801 06/25/19 1231 06/25/19 1643 06/25/19 2201 06/26/19 0810  GLUCAP 112* 205* 212* 175* 149*    Microbiology: Results for orders placed or performed during the hospital encounter of 06/21/19  Blood Culture (routine x 2)     Status: None   Collection Time: 06/21/19  2:08 AM   Specimen: BLOOD  Result Value Ref Range Status   Specimen Description BLOOD BLOOD RIGHT FOREARM  Final   Special Requests   Final    BOTTLES DRAWN AEROBIC AND ANAEROBIC Blood Culture adequate volume   Culture   Final    NO GROWTH 5 DAYS Performed at [facility], [address] [place], [place] [address]    Report Status 06/26/2019 FINAL  Final  Blood Culture (routine x 2)     Status:  None   Collection Time: 06/21/19  2:13 AM   Specimen: BLOOD  Result Value Ref Range Status   Specimen Description BLOOD LEFT ANTECUBITAL  Final   Special Requests   Final    BOTTLES DRAWN AEROBIC AND ANAEROBIC Blood Culture results may not be optimal due to an inadequate volume of blood received in culture bottles   Culture   Final    NO GROWTH 5 DAYS Performed at [facility], [address] [place], [place] [address]    Report Status 06/26/2019 FINAL  Final  Respiratory Panel by PCR     Status: None   Collection Time: 06/21/19 11:56 AM   Specimen: Nasopharyngeal Swab; Respiratory  Result Value Ref Range Status   Adenovirus NOT DETECTED NOT DETECTED Final   Coronavirus 229E NOT DETECTED NOT DETECTED Final    Comment: (NOTE) The  Coronavirus on the Respiratory Panel, DOES NOT test for the novel  Coronavirus (2019 nCoV)    Coronavirus HKU1 NOT DETECTED NOT DETECTED Final   Coronavirus NL63 NOT DETECTED NOT DETECTED Final   Coronavirus OC43 NOT DETECTED NOT DETECTED Final   Metapneumovirus NOT DETECTED NOT DETECTED Final   Rhinovirus / Enterovirus NOT DETECTED NOT DETECTED Final   Influenza A NOT DETECTED NOT DETECTED Final   Influenza B NOT DETECTED NOT DETECTED Final   Parainfluenza Virus 1 NOT DETECTED NOT DETECTED Final   Parainfluenza Virus 2 NOT DETECTED NOT DETECTED Final   Parainfluenza Virus 3 NOT DETECTED NOT DETECTED Final   Parainfluenza Virus 4 NOT DETECTED NOT DETECTED Final   Respiratory Syncytial Virus NOT DETECTED NOT DETECTED Final   Bordetella pertussis NOT DETECTED NOT DETECTED Final   Chlamydophila pneumoniae NOT DETECTED NOT DETECTED Final   Mycoplasma pneumoniae NOT DETECTED NOT DETECTED Final    Comment: Performed at [facility], [place] [address], [place], [place] [address]  Urine culture     Status: None   Collection Time: 06/23/19  9:55 AM   Specimen: In/Out Cath Urine  Result Value Ref Range Status   Specimen Description   Final    IN/OUT CATH URINE Performed at [facility], [address], [place], [place] [address]    Special Requests   Final    NONE Performed at [facility], [address], [place], [place] [address]    Culture   Final    NO GROWTH Performed at [facility], [place] [address], [place], [place] [address]    Report Status 06/25/2019 FINAL  Final    Coagulation Studies: No results for input(s): LABPROT, INR in the last 72 hours.  Urinalysis: No results for input(s): COLORURINE, LABSPEC, PHURINE, GLUCOSEU, HGBUR, BILIRUBINUR, KETONESUR, PROTEINUR, UROBILINOGEN, NITRITE, LEUKOCYTESUR in the last 72 hours.  Invalid input(s): APPERANCEUR    Imaging: No results found.   Medications:    . albuterol  2 puff Inhalation Q6H   . amLODipine  10 mg Oral Daily  . aspirin EC  81 mg Oral Daily  . atorvastatin  80 mg Oral Daily  . carvedilol  25 mg Oral BID  . Chlorhexidine Gluconate Cloth  6 each Topical Daily  . DULoxetine  30 mg Oral Daily  . furosemide  40 mg Intravenous BID  . heparin injection (subcutaneous)  5,000 Units Subcutaneous Q8H  . insulin aspart  0-20 Units Subcutaneous TID WC  . insulin aspart  0-5 Units Subcutaneous QHS  . insulin aspart  4 Units Subcutaneous TID WC  . insulin detemir  25 Units Subcutaneous QHS  . levothyroxine  100 mcg Oral Daily  . pentoxifylline  400 mg Oral TID   alum & mag hydroxide-simeth, ipratropium-albuterol, traMADol, traZODone  Assessment/ Plan:  Mr. BLAN SHOCKLEY is a 68 y.o. white male with insulin dependent diabetes mellitus type II, hypertension, coronary artery disease status post CABG, COPD, COVID-19 infection 04/2019, chronic venous stasis ulcer, , who was admitted to [facility] on 06/21/2019 for acute exacerbation of systolic congestive heart failure. Echocardiogram 3/8 at [facility] with EF of 50%.   1. Acute kidney failure on chronic kidney disease stage IV with proteinuria: baseline creatinine of 2.85, GFR of 22 on 12/18/18.  Chronic kidney disease secondary to diabetic nephropathy. Followed by [facility], Dr. Alfonse Alpers Acute kidney injury secondary to acute cardiorenal syndrome. Not on an ACE-I/ARB at home.  - Patient requiring temporary dialysis. First treatment on 12/16. Urine output has improved with IV furosemide Serum creatinine slightly worse today Electrolytes and volume status are acceptable.  No acute indication for dialysis at present.  We will remove the temporary dialysis catheter to assist with mobility for the patient.  If he needs more dialysis, we will likely request a tunneled dialysis catheter.   2. Hypertension with acute exacerbation of diastolic congestive heart failure.  Significant lower extremity edema/volume overload -Recommend continuing  IV furosemide today and changing to oral torsemide 40 mg tomorrow.   3. Anemia with chronic kidney disease: normocytic.  Lab Results  Component Value Date   HGB 8.1 (L) 06/26/2019  iron studies tomorrow     LOS: 5 Barbarajean Kinzler 12/29/20209:48 AM

## 2019-06-26 NOTE — Progress Notes (Signed)
PROGRESS NOTE    Bryce Garcia  [id number] DOB: 05/08/1951 DOA: 06/21/2019 PCP: [facility]    Brief Narrative:  Bryce Garcia a 68 y.o.malewith medical history significanttype 1 diabetes, CAD status post CABG, systolic heart failure, last EF 50%, CKD 4, type 1 diabetes, hypertension, who had COVID-19 infection over 4 weeks ago from which he states he had completely resolved, who presents with a 4-day history of progressively worsening shortness of breath. Denies chest pain and denies lower extremity pain or swelling. He denies having a cough or fever. Denies nausea vomiting or diaphoresis, denies abdominal pain or change in bowel habits. On arrival in the emergency room his O2 sat was 68% on room air, improving to 94% on 4 L. Temperature was 99 with blood pressure 157/62 heart rate 83 and respirations 18/min. His chest x-ray showed multifocal airspace opacities with consolidation of the left midlung. On his blood work BNP was elevated at 1167. EKG showed no acute ST-T wave changes. Other blood work remarkable for creatinine of 3.88 up from his baseline of 2.87, blood sugar of 233, hemoglobin of 9.1, down from baseline of 11.7 and white cell count of 12.5. Baseline labs were obtained from review of care everywhere from [facility] where patient gets most of his care. RepeatCovid test in the ER was negative. She was started on Rocephin and azithromycin and hospitalization requested. Status discussed and patient elects to be DNR,was confirmed with son on phone.    Consultants:   Cardiology, nephrology  Procedures:  Echo 12/24 1. Left ventricular ejection fraction, by visual estimation, is 50 to 55%. The left ventricle has normal function. There is no left ventricular hypertrophy.  2. Left ventricular diastolic parameters are consistent with Grade II diastolic dysfunction (pseudonormalization).  3. The left ventricle has no regional  wall motion abnormalities.  4. Global right ventricle has normal systolic function.The right ventricular size is normal. No increase in right ventricular wall thickness.  5. Left atrial size was mildly dilated.  6. Moderate mitral valve regurgitation.  7. TR signal is inadequate for assessing pulmonary artery systolic pressure.  Antimicrobials:   Azithromycin and ceftriaxone    Subjective: Less sob. No new complaints. Still not at baseline  Objective: Vitals:   06/25/19 1952 06/26/19 0421 06/26/19 1044 06/26/19 1207  BP: (!) 155/67 (!) 148/64  125/78  Pulse: 74 69  69  Resp: 20 20  16   Temp: 97.9 F (36.6 C) 98.1 F (36.7 C)  98.4 F (36.9 C)  TempSrc: Oral Oral  Oral  SpO2: 96% 98% (!) 89% 95%  Weight:      Height:        Intake/Output Summary (Last 24 hours) at 06/26/2019 1806 Last data filed at 06/26/2019 1600 Gross per 24 hour  Intake --  Output 2225 ml  Net -2225 ml   Filed Weights   06/21/19 0150 06/23/19 1530  Weight: 88.5 kg 89.5 kg    Examination: General exam: Appears calm and comfortable, NAD Respiratory system: Decreased breath sounds at bases, no rales rhonchi's or wheezing Cardiovascular system: S1 & S2 heard, RRR.  No murmurs, rubs, gallops. Gastrointestinal system: Abdomen NT/ND, +bs Central nervous system: Alert and oriented x3. No focal neurological deficits. Extremities: bilateral  LE Edema, less than yesterday up to mid thigh. ue no edema Skin: Warm dry Psychiatry: Judgement and insight appear normal. Mood & affect appropriate.      Data Reviewed: I have personally reviewed following labs and  imaging studies  CBC: Recent Labs  Lab 06/21/19 0154 06/22/19 0506 06/23/19 0630 06/24/19 0632 06/25/19 0718 06/26/19 0309  WBC 12.5* 13.3* 19.4* 13.4* 10.2 10.3  NEUTROABS 10.2*  --   --   --   --   --   HGB 9.1* 8.6* 9.1* 8.8* 8.3* 8.1*  HCT 28.3* 26.4* 26.8* 26.6* 24.8* 24.8*  MCV 90.4 90.1 87.0 90.2 87.9 89.9  PLT 349 350 375 366 336  [id number]   Basic Metabolic Panel: Recent Labs  Lab 06/22/19 0506 06/23/19 0317 06/24/19 0633 06/25/19 0718 06/26/19 0309  NA 132* 136 138 139 138  K 4.9 4.5 4.1 3.8 4.3  CL 100 102 103 105 104  CO2 22 18* 23 23 23   GLUCOSE 417* 118* 141* 127* 188*  BUN 82* 90* 65* 64* 64*  CREATININE 4.53* 4.62* 3.40* 3.36* 3.52*  CALCIUM 8.7* 8.4* 8.1* 8.1* 8.4*  PHOS  --   --  4.1 4.1  --    GFR: Estimated Creatinine Clearance: 22.4 mL/min (A) (by C-G formula based on SCr of 3.52 mg/dL (H)). Liver Function Tests: Recent Labs  Lab 06/24/19 [id number] 06/25/19 0718  ALBUMIN 2.7* 2.4*   No results for input(s): LIPASE, AMYLASE in the last 168 hours. No results for input(s): AMMONIA in the last 168 hours. Coagulation Profile: Recent Labs  Lab 06/21/19 0155  INR 1.0   Cardiac Enzymes: No results for input(s): CKTOTAL, CKMB, CKMBINDEX, TROPONINI in the last 168 hours. BNP (last 3 results) No results for input(s): PROBNP in the last 8760 hours. HbA1C: No results for input(s): HGBA1C in the last 72 hours. CBG: Recent Labs  Lab 06/25/19 1643 06/25/19 2201 06/26/19 0810 06/26/19 1202 06/26/19 1722  GLUCAP 212* 175* 149* 216* 276*   Lipid Profile: No results for input(s): CHOL, HDL, LDLCALC, TRIG, CHOLHDL, LDLDIRECT in the last 72 hours. Thyroid Function Tests: No results for input(s): TSH, T4TOTAL, FREET4, T3FREE, THYROIDAB in the last 72 hours. Anemia Panel: Recent Labs    06/26/19 0309  FERRITIN 124  TIBC 152*  IRON 23*   Sepsis Labs: Recent Labs  Lab 06/21/19 0155 06/21/19 0412 06/21/19 0433  PROCALCITON  --   --  0.30  LATICACIDVEN 1.4 0.8  --     Recent Results (from the past 240 hour(s))  Blood Culture (routine x 2)     Status: None   Collection Time: 06/21/19  2:08 AM   Specimen: BLOOD  Result Value Ref Range Status   Specimen Description BLOOD BLOOD RIGHT FOREARM  Final   Special Requests   Final    BOTTLES DRAWN AEROBIC AND ANAEROBIC Blood Culture adequate  volume   Culture   Final    NO GROWTH 5 DAYS Performed at [facility], [address] [place], [place] [address]    Report Status 06/26/2019 FINAL  Final  Blood Culture (routine x 2)     Status: None   Collection Time: 06/21/19  2:13 AM   Specimen: BLOOD  Result Value Ref Range Status   Specimen Description BLOOD LEFT ANTECUBITAL  Final   Special Requests   Final    BOTTLES DRAWN AEROBIC AND ANAEROBIC Blood Culture results may not be optimal due to an inadequate volume of blood received in culture bottles   Culture   Final    NO GROWTH 5 DAYS Performed at [facility], [address], [place], [place] [address]    Report Status 06/26/2019 FINAL  Final  Respiratory Panel by PCR  Status: None   Collection Time: 06/21/19 11:56 AM   Specimen: Nasopharyngeal Swab; Respiratory  Result Value Ref Range Status   Adenovirus NOT DETECTED NOT DETECTED Final   Coronavirus 229E NOT DETECTED NOT DETECTED Final    Comment: (NOTE) The Coronavirus on the Respiratory Panel, DOES NOT test for the novel  Coronavirus (2019 nCoV)    Coronavirus HKU1 NOT DETECTED NOT DETECTED Final   Coronavirus NL63 NOT DETECTED NOT DETECTED Final   Coronavirus OC43 NOT DETECTED NOT DETECTED Final   Metapneumovirus NOT DETECTED NOT DETECTED Final   Rhinovirus / Enterovirus NOT DETECTED NOT DETECTED Final   Influenza A NOT DETECTED NOT DETECTED Final   Influenza B NOT DETECTED NOT DETECTED Final   Parainfluenza Virus 1 NOT DETECTED NOT DETECTED Final   Parainfluenza Virus 2 NOT DETECTED NOT DETECTED Final   Parainfluenza Virus 3 NOT DETECTED NOT DETECTED Final   Parainfluenza Virus 4 NOT DETECTED NOT DETECTED Final   Respiratory Syncytial Virus NOT DETECTED NOT DETECTED Final   Bordetella pertussis NOT DETECTED NOT DETECTED Final   Chlamydophila pneumoniae NOT DETECTED NOT DETECTED Final   Mycoplasma pneumoniae NOT DETECTED NOT DETECTED Final    Comment: Performed at [facility], [place]. [address], [place], [place] [address]  Urine culture     Status: None   Collection Time: 06/23/19  9:55 AM   Specimen: In/Out Cath Urine  Result Value Ref Range Status   Specimen Description   Final    IN/OUT CATH URINE Performed at [facility], [address], [place], [place] [address]    Special Requests   Final    NONE Performed at [facility], [address], [place], [place] [address]    Culture   Final    NO GROWTH Performed at [facility], [place] [address], [place],  [address]    Report Status 06/25/2019 FINAL  Final         Radiology Studies: No results found.      Scheduled Meds: . albuterol  2 puff Inhalation Q6H  . amLODipine  10 mg Oral Daily  . aspirin EC  81 mg Oral Daily  . atorvastatin  80 mg Oral Daily  . carvedilol  25 mg Oral BID  . Chlorhexidine Gluconate Cloth  6 each Topical Daily  . DULoxetine  30 mg Oral Daily  . furosemide  40 mg Intravenous BID  . heparin injection (subcutaneous)  5,000 Units Subcutaneous Q8H  . insulin aspart  0-20 Units Subcutaneous TID WC  . insulin aspart  0-5 Units Subcutaneous QHS  . insulin aspart  4 Units Subcutaneous TID WC  . insulin detemir  25 Units Subcutaneous QHS  . levothyroxine  100 mcg Oral Daily  . multivitamins with iron  1 tablet Oral Daily  . pentoxifylline  400 mg Oral TID   Continuous Infusions:   Assessment & Plan:   Principal Problem:   Acute respiratory failure with hypoxia (HCC) Active Problems:   CAP (community acquired pneumonia)   Venous ulcer of left leg (HCC)   COPD with chronic bronchitis (HCC)   CKD (chronic kidney disease) stage 4, GFR 15-29 ml/min (HCC)   AKI (acute kidney injury) ([place])   Chronic systolic CHF (congestive heart failure) ([place])   History of 2019 novel coronavirus disease (COVID-19)   Anemia   Acute respiratory failure (HCC)   Hyperglycemia due to type 1 diabetes mellitus (HCC)   Hx of CABG   Non-ST elevation (NSTEMI)  myocardial infarction ([place])   Acute on chronic heart failure with preserved ejection fraction (HFpEF) (HCC)   Acute renal failure with acute renal cortical necrosis superimposed on stage 3 chronic kidney disease ([place])   NSTEMI --In setting of acute respiratory distress and pneumonia  elevated troponin Echo with EF of 50 to 55%, grossly no wall motion abnormality Cardiology following -recommend continuing heparin until tomorrow to finish 48 hours.   Needs ischemic w/u however not cardiac cath as renal function worsening at this time.  Maybe after HD starts or pharmacologic myoview Continue with beta-blockers and aspirin, statin   Acute respiratory failure with hypoxia (HCC) --Most likely related to community-acquired pneumonia and CHF exacerbation. Imroving with abx and start of HD (first HD on 12/26) --O2 sat was 68% on room air on arrival improving to 94% on 4 L patient has no prior history of home oxygen use --Supplemental oxygen to keep sats over 90% Stopped Decadron as he does not require at this time Continue  IV antibiotics  HD evaluation daily-nephrology following.Marland Kitchen>No HD today Try to wean off 02 if can Continue lasix 40 iv bid per nephrology, start torsemide 40 mg daily in a.m. Monitor UO   Community acquired pneumonia in the setting of recent history of COVID-19 infection about 4 weeks ago-- --Patient has no prior history of supplemental oxygen use ---Chest x-ray showing multifocal pneumonia with consolidative opacity in the left midlung --Repeat Covid test /respiratory panel negative --IV Rocephin and azithromycin for 5 to 7 days --Albuterol every 6 hours and as needed --Supplemental oxygen to keep sats over 90% --Blood cultures pending.-so far negative.  -respiratory panel negative  Acute onChronic Diastolic and systolic CHF (congestive heart failure) ([place]) --Patient followed at [facility].Last EF on file was 50%on 3/2020improved from 40% Echo here EF  50-55% with Grade II diastolic dysfunction. -- Chest x-ray shows small bilateral pleural effusion --Appeared volume overloaded, started on  Lasix IV- with worsening of renal function and not great response with diuretics, improving with temporary HD. Resumed IV lasix on 12/28...>start torsemid 40mg  on 12/30 Strict I/O Daily weight   Anemia,uncertain acuity-likely ACKD --Hemoglobin 9.1. Most hemoglobin on file on chart everywhere was 11.7 --Suspect related to worsening kidney function --Stool for occult blood to evaluate for GI etiology-pending --Monitor H&H  Hyperglycemia due to type 1 diabetes mellitus ([employer]) Decadron stopped Now BG better continue Lantus to 25Units . continue novolog to 4u with meals carb control  Diet with low sodium HA1C 6.8   Venous ulcer of left leg (HCC) --Recently seen by PCP on 06/19/2019 --Daily wound care  COPD with chronic bronchitis ([place]) --Does not appear to be acutely exacerbated --Albuterol prn   Acute kidney injury superimposed onCKD (chronic kidney disease) stage 4, GFR 15-29 ml/min (HCC) with metabolic acidosis --BUN creatinine of55/3.88,most recent creatinine of 2.85 in June 2020 per Care Everywhere Was worsening, despite diuretic.  Nephrology started temporary HD , first on 12/26. Evaluate daily for need of HD. Dialysis catheter placed on 12/26.   history ofCABG --Patient has history of CABG in 2005 -Continue with aspirin, statin, beta-blockers   PT-recommend SNF  DVT prophylaxis Heparin Code Status: DNR Family Communication: None at bedside Disposition Plan: SNF on d/c. Will likely be here 2 MN days as he is not medically stable.     LOS: 5 days   Time spent: 45 minutes with more than 50% COC    Nolberto Hanlon, MD [facility] Pager [phone]  If 7PM-7AM, please contact night-coverage [email or web address] Password  [email or web address] 06/26/2019, 6:06 PM Patient ID: [id number], male   DOB: 1950-12-26, 68  y.o.   MRN: [id number]

## 2019-06-26 NOTE — Progress Notes (Signed)
Progress Note  Patient Name: Bryce Garcia Date of Encounter: 06/26/2019  Primary [facility]  Subjective   Patient denies chest pain, racing heart rate, palpitations, shortness of breath, and lower extremity edema.  Denies any nausea or emesis and reportedly ate breakfast without difficulty.  Reports his cough as nonproductive today. States he is eager to go home.  Inpatient Medications    Scheduled Meds: . albuterol  2 puff Inhalation Q6H  . amLODipine  10 mg Oral Daily  . aspirin EC  81 mg Oral Daily  . atorvastatin  80 mg Oral Daily  . carvedilol  25 mg Oral BID  . Chlorhexidine Gluconate Cloth  6 each Topical Daily  . DULoxetine  30 mg Oral Daily  . furosemide  40 mg Intravenous BID  . heparin injection (subcutaneous)  5,000 Units Subcutaneous Q8H  . insulin aspart  0-20 Units Subcutaneous TID WC  . insulin aspart  0-5 Units Subcutaneous QHS  . insulin aspart  4 Units Subcutaneous TID WC  . insulin detemir  25 Units Subcutaneous QHS  . levothyroxine  100 mcg Oral Daily  . multivitamins with iron  1 tablet Oral Daily  . pentoxifylline  400 mg Oral TID   Continuous Infusions:  PRN Meds: alum & mag hydroxide-simeth, ipratropium-albuterol, traMADol, traZODone   Vital Signs    Vitals:   06/25/19 1952 06/26/19 0421 06/26/19 1044 06/26/19 1207  BP: (!) 155/67 (!) 148/64  125/78  Pulse: 74 69  69  Resp: 20 20  16   Temp: 97.9 F (36.6 C) 98.1 F (36.7 C)  98.4 F (36.9 C)  TempSrc: Oral Oral  Oral  SpO2: 96% 98% (!) 89% 95%  Weight:      Height:        Intake/Output Summary (Last 24 hours) at 06/26/2019 1403 Last data filed at 06/26/2019 1300 Gross per 24 hour  Intake 240 ml  Output 2350 ml  Net -2110 ml   Last 3 Weights 06/23/2019 06/21/2019 01/15/2016  Weight (lbs) 197 lb 5 oz 195 lb 182 lb  Weight (kg) 89.5 kg 88.451 kg 82.555 kg      Telemetry    NSR, artifact- Personally Reviewed  ECG    No new tracing- Personally  Reviewed  Physical Exam   GEN: No acute distress.  Sitting at the edge of the bed. Neck: No JVD Cardiac: RRR, no murmurs, rubs, or gallops.  Respiratory:  Bibasilar rhonchi, clearing with cough. GI: Soft, nontender, non-distended  MS:  Mild bilateral edema; No deformity. Neuro:  Nonfocal  Psych: Normal affect   Labs    High Sensitivity Troponin:   Recent Labs  Lab 06/21/19 0156 06/21/19 0412 06/21/19 0433 06/21/19 1439  TROPONINIHS 1,369* 2,780* 5,300* 8,795*      Cardiac EnzymesNo results for input(s): TROPONINI in the last 168 hours. No results for input(s): TROPIPOC in the last 168 hours.   Chemistry Recent Labs  Lab 06/24/19 0633 06/25/19 0718 06/26/19 0309  NA 138 139 138  K 4.1 3.8 4.3  CL 103 105 104  CO2 23 23 23   GLUCOSE 141* 127* 188*  BUN 65* 64* 64*  CREATININE 3.40* 3.36* 3.52*  CALCIUM 8.1* 8.1* 8.4*  ALBUMIN 2.7* 2.4*  --   GFRNONAA 18* 18* 17*  GFRAA 21* 21* 20*  ANIONGAP 12 11 11      Hematology Recent Labs  Lab 06/24/19 [id number] 06/25/19 0718 06/26/19 0309  WBC 13.4* 10.2 10.3  RBC 2.95* 2.82* 2.76*  HGB 8.8* 8.3*  8.1*  HCT 26.6* 24.8* 24.8*  MCV 90.2 87.9 89.9  MCH 29.8 29.4 29.3  MCHC 33.1 33.5 32.7  RDW 14.2 14.3 14.3  PLT 366 336 356    BNP Recent Labs  Lab 06/21/19 0155  BNP 1,167.0*     DDimer No results for input(s): DDIMER in the last 168 hours.   Radiology    No results found.  Cardiac Studies   TTE (06/21/2019): 1. Left ventricular ejection fraction, by visual estimation, is 50 to 55%. The left ventricle has normal function. There is no left ventricular hypertrophy. 2. Left ventricular diastolic parameters are consistent with Grade II diastolic dysfunction (pseudonormalization). 3. The left ventricle has no regional wall motion abnormalities. 4. Global right ventricle has normal systolic function.The right ventricular size is normal. No increase in right ventricular wall thickness. 5. Left atrial size was  mildly dilated. 6. Moderate mitral valve regurgitation. 7. TR signal is inadequate for assessing pulmonary artery systolic pressure.  Patient Profile     68 y.o. male with history of CAD s/p CABG and PCI, CKD stage III, DM 2, COPD, and COVID-19 infection in 04/2019, and admitted with worsening shortness of breath.  Assessment & Plan    NSTEMI --No chest pain or shortness of breath reported today. HS Tn trended up to 8795.0 on 12/24 but not cycled to ensure peaked and down-trending. Most recent echo above with EF 50-55% and G2DD.  As previously noted, etiology of troponin elevation and previous dyspnea unknown and could be 2/2 acute ischemic event vs. supply demand ischemia. Patient not an ideal candidate for cardiac catheterization this admission in the setting of acute on chronic kidney disease and respiratory distress with volume overload at presentation. S/p 4 days of IV heparin.  Recommend future outpatient ischemic evaluation with catheterization versus NM Myoview Lexiscan study after discharge by primary cardiologist /  [facility] cardiologist and per patient preference.  Addition of Plavix has been deferred in the setting of anemia and pending further workup with recommendation for start with stable Hgb.  Daily CBC.  Continue to trend hemoglobin with recommendation for transfusion if hemoglobin below 8.0 (currently 8.1).  Continue statin for aggressive secondary prevention, as well as glycemic control.  Continue carvedilol and amlodipine for BP and anginal therapy. BP today improved. Continue PRN SL nitro. Given renal function, no ACE/ARB.  Acute on chronic HFpEF --Denies any further shortness of breath despite SpO2 documented as dipping into the 80s.  Echo as above with EF 50 to 55% and G2DD.  S/p temporary dialysis with first treatment 12/16.  Started on IV diuresis with report of improved urine output.  Net -2 L over the last 24 hours.  Per nephrology, current plan to remove temporary dialysis  catheter to assist with mobility for the patient and also to transition to oral torsemide 40 mg tomorrow. For now, continue IV lasix. Could consider ReDS vest at discharge. As above, continue to defer ACE/ARB. Daily BMET with Cr slightly worse today but BUN stable. Continue to monitor closely.  Acute on chronic kidney disease --Concern for acute tubular necrosis in the setting of acute hypoxic respiratory failure.  S/p temporary hemodialysis with plan to remove temporary dialysis catheter and no indication for acute dialysis at this time.  Avoid nephrotoxins.  Recommend close follow-up with [facility] nephrology, Dr. Alfonse Alpers.   For questions or updates, please contact [place] Please consult [email or web address] for contact info under        Signed, Arvil Chaco, PA-C  06/26/2019,  2:03 PM

## 2019-06-27 DIAGNOSIS — I5033 Acute on chronic diastolic (congestive) heart failure: Secondary | ICD-10-CM

## 2019-06-27 LAB — GLUCOSE, CAPILLARY
Glucose-Capillary: 177 mg/dL — ABNORMAL HIGH (ref 70–99)
Glucose-Capillary: 305 mg/dL — ABNORMAL HIGH (ref 70–99)
Glucose-Capillary: 233 mg/dL — ABNORMAL HIGH (ref 70–99)
Glucose-Capillary: 160 mg/dL — ABNORMAL HIGH (ref 70–99)

## 2019-06-27 LAB — RENAL FUNCTION PANEL
Albumin: 2.4 g/dL — ABNORMAL LOW (ref 3.5–5.0)
Anion gap: 12 (ref 5–15)
BUN: 63 mg/dL — ABNORMAL HIGH (ref 8–23)
CO2: 22 mmol/L (ref 22–32)
Calcium: 8.6 mg/dL — ABNORMAL LOW (ref 8.9–10.3)
Chloride: 104 mmol/L (ref 98–111)
Creatinine, Ser: 3.47 mg/dL — ABNORMAL HIGH (ref 0.61–1.24)
GFR calc Af Amer: 20 mL/min — ABNORMAL LOW (ref >60)
GFR calc non Af Amer: 17 mL/min — ABNORMAL LOW (ref >60)
Glucose, Bld: 217 mg/dL — ABNORMAL HIGH (ref 70–99)
Phosphorus: 4.1 mg/dL (ref 2.5–4.6)
Potassium: 4.1 mmol/L (ref 3.5–5.1)
Sodium: 138 mmol/L (ref 135–145)

## 2019-06-27 LAB — TRANSFERRIN: Transferrin: 115 mg/dL — ABNORMAL LOW (ref 180–329)

## 2019-06-27 MED ORDER — OXYCODONE HCL 5 MG PO TABS
5.0000 mg | ORAL_TABLET | Freq: Four times a day (QID) | ORAL | Status: DC | PRN
  Administered 2019-06-27 (×2): 5 mg via ORAL
  Filled 2019-06-27 (×3): qty 1

## 2019-06-27 MED ORDER — INSULIN ASPART 100 UNIT/ML ~~LOC~~ SOLN
8.0000 [IU] | Freq: Three times a day (TID) | SUBCUTANEOUS | Status: DC
  Administered 2019-06-28 (×2): 8 [IU] via SUBCUTANEOUS
  Filled 2019-06-27 (×2): qty 1

## 2019-06-27 NOTE — Progress Notes (Signed)
Physical Therapy Treatment Patient Details Name: Bryce Garcia MRN: [id number] DOB: August 28, 1950 Today's Date: 06/27/2019    History of Present Illness This 68 y/o male was adm for PNA post-covid. PMH includes:  Covid infection, COPD, CAD s/p 4 vessel CABG, DM, CHF, CKD4, EF 50%. Of note, Pt recieving HD via R femoral vein central line while in acute setting, removed 12/29.    PT Comments    Patient alert, sitting EOB at start of session, denied pain initially. Pt does have large wound on his L shin that he stated the MD is aware of;reported he dropped wood at home on his leg. Fem cath removed yesterday per RN and pt, cleared for mobility. The patient was able to ambulate ~53ft total (seated rest breaks every 20-79ft) with and without RW, improved safety noted with walker though it did further decrease his gait velocity. SpO2 monitored heavily during session; trialed on room air, dropped to 81% with ambulation. 3-4L utilized for mobility, 89-91% after ambulation. RN notified of patient status, pt up in chair at end of session all needs in reach. Discharge recommendation updated to HHPT with 24/7 supervision, pt adamant about returning home. Educated on importance of oxygen compliance, pt verbalized understanding.     Follow Up Recommendations  Home health PT;Supervision/Assistance - 24 hour     Equipment Recommendations  Rolling walker with 5" wheels;Other (comment)(Pt reported he can utilize his mothers old walker)    Recommendations for Other Services       Precautions / Restrictions Precautions Precautions: Fall Restrictions Weight Bearing Restrictions: No    Mobility  Bed Mobility Overal bed mobility: Modified Independent             General bed mobility comments: sitting EOB at start of session  Transfers Overall transfer level: Needs assistance Equipment used: Rolling walker (2 wheeled);None Transfers: Sit to/from Stand Sit to Stand: Supervision             Ambulation/Gait   Gait Distance (Feet): 90 Feet Assistive device: Rolling walker (2 wheeled);None(several bouts of ambulation trials)       General Gait Details: Pt able to ambulate without RW, improve safety though decreased gait velocity with RW. Pt agreeable to utilize RW at home until stronger.   Stairs             Wheelchair Mobility    Modified Rankin (Stroke Patients Only)       Balance Overall balance assessment: Needs assistance Sitting-balance support: Feet supported Sitting balance-Leahy Scale: Good       Standing balance-Leahy Scale: Fair Standing balance comment: able to ambulate but reaches for external support, able to wash hands at sink                            Cognition Arousal/Alertness: Awake/alert Behavior During Therapy: WFL for tasks assessed/performed Overall Cognitive Status: Within Functional Limits for tasks assessed                                        Exercises Other Exercises Other Exercises: Pt was able to use commode and perform self care with supervision Other Exercises: Pt educated on PLB especially with mobility Other Exercises: SpO2 monitored throughout session, pt on wide range of O2 1L at rest and 3-4L with mobility    General Comments  Pertinent Vitals/Pain Pain Assessment: No/denies pain    Home Living                      Prior Function            PT Goals (current goals can now be found in the care plan section) Progress towards PT goals: Progressing toward goals    Frequency    Min 2X/week      PT Plan Discharge plan needs to be updated    Co-evaluation              AM-PAC PT "6 Clicks" Mobility   Outcome Measure  Help needed turning from your back to your side while in a flat bed without using bedrails?: A Little Help needed moving from lying on your back to sitting on the side of a flat bed without using bedrails?: A Little Help needed  moving to and from a bed to a chair (including a wheelchair)?: A Little Help needed standing up from a chair using your arms (e.g., wheelchair or bedside chair)?: A Little Help needed to walk in hospital room?: A Little Help needed climbing 3-5 steps with a railing? : A Little 6 Click Score: 18    End of Session Equipment Utilized During Treatment: Oxygen;Gait belt(1-4L) Activity Tolerance: Patient tolerated treatment well Patient left: in chair;with call bell/phone within reach Nurse Communication: Mobility status PT Visit Diagnosis: Muscle weakness (generalized) (M62.81);Difficulty in walking, not elsewhere classified (R26.2)     Time: [id number] PT Time Calculation (min) (ACUTE ONLY): 29 min  Charges:  $Therapeutic Exercise: 8-22 mins $Therapeutic Activity: 8-22 mins                     Lieutenant Diego PT, DPT 10:40 AM,06/27/19

## 2019-06-27 NOTE — TOC Progression Note (Signed)
Transition of Care [facility]) - Progression Note    Patient Details  Name: Bryce Garcia MRN: [id number] Date of Birth: [birth year]  [facility] CM/SW Contact  Beverly Sessions, RN Phone Number: 06/27/2019, 3:51 PM  Clinical Narrative:    RNCM follow up with PT recommendation of home health PT.   Patient in agreement.  States he does not have a preference of home health agency.  Referral made and accepted to [place] with [facility].   Per MD patient will need home O2 at discharge.  Brad with [employer] notified.  Requested for bedside RN to get qualifying sats    Expected Discharge Plan: [place] Barriers to Discharge: Continued Medical Work up  Expected Discharge Plan and Services Expected Discharge Plan: [place]   Discharge Planning Services: CM Consult Post Acute Care Choice: [place] arrangements for the past 2 months: Single Family Home                           HH Arranged: RN, PT [facility] Agency: [facility] Date [facility] Agency Contacted: 06/27/19 Time [place]: 1550 Representative spoke with at [place]: [place] ([place]) Interventions    Readmission Risk Interventions No flowsheet data found.

## 2019-06-27 NOTE — Progress Notes (Signed)
PROGRESS NOTE  Bryce Garcia [id number] DOB: 08/30/1950 DOA: 06/21/2019 PCP: [place]  Acute kidney injury superimposed on chronic kidney disease stage IV with proteinuria, secondary to diabetic nephropathy, acute kidney injury secondary to acute cardiorenal syndrome, required temporary dialysis starting 12/16 --Currently not on dialysis.  Follow-up with [facility] nephrology as an outpatient. --Continue torsemide 40 mg daily  Acute hypoxic respiratory failure, dropped to 81% with ambulation, utilize 3-4 L with mobility. --needs home oxygen, will arrange.  Presumably secondary to CHF, NSTEMI  Anemia of CKD --Stable.  Follow-up as an outpatient.  NSTEMI --Asymptomatic.  Continue aspirin, Lipitor, carvedilol, amlodipine.  Clopidogrel deferred secondary to anemia.  Cardiology will arrange outpatient follow-up with [facility].  Acute on chronic diastolic CHF --Continue torsemide as per cardiology and nephrology.  Diabetes mellitus type 2 with CKD --NovoLog 8 units 3 times daily with meals.  Continue Levemir sliding scale insulin.  Status post COVID-19 infection 04/2019  DVT prophylaxis: heparin Code Status: DNR Family Communication: none Disposition Plan: Home with home health PT, rolling walker 5 inch wheels   Murray Hodgkins, MD  [facility] Direct contact: see [email or web address] (further directions at bottom of note if needed) 7PM-7AM contact night coverage as at bottom of note 06/27/2019, 2:31 PM  LOS: 6 days   Significant Hospital Events   .    Consults:  .    Procedures:  .   Significant Diagnostic Tests:  Marland Kitchen    Micro Data:  .    Antimicrobials:  .   Interval History/Subjective  Feels well, breathing well, no complaints.  Objective   Vitals:  Vitals:   06/27/19 1046 06/27/19 1152  BP: (!) 149/61 (!) 157/85  Pulse: 68 74  Resp:  18  Temp:  97.8 F (36.6 C)  SpO2:  93%    Exam: Constitutional appears  calm and comfortable. Cardiovascular.  Regular rate and rhythm.  No murmur, rub,. Respiratory.  Clear to auscultation bilaterally.  No wheezes, rales or rhonchi.  Normal respiratory effort. Abdomen.  Soft, nontender, nondistended. Psychiatric.  Grossly normal mood and affect.  Speech fluent and appropriate.  I have personally reviewed the following:   Today's Data  . CBG stable . BUN/creatinine stable at 63 and 3.47.  Gap within normal limits.  Scheduled Meds: . albuterol  2 puff Inhalation Q6H  . amLODipine  10 mg Oral Daily  . aspirin EC  81 mg Oral Daily  . atorvastatin  80 mg Oral Daily  . carvedilol  25 mg Oral BID  . DULoxetine  30 mg Oral Daily  . heparin injection (subcutaneous)  5,000 Units Subcutaneous Q8H  . insulin aspart  0-20 Units Subcutaneous TID WC  . insulin aspart  0-5 Units Subcutaneous QHS  . insulin aspart  4 Units Subcutaneous TID WC  . insulin detemir  25 Units Subcutaneous QHS  . levothyroxine  100 mcg Oral Daily  . multivitamins with iron  1 tablet Oral Daily  . pentoxifylline  400 mg Oral TID  . torsemide  40 mg Oral Daily   Continuous Infusions:  Principal Problem:   Acute respiratory failure with hypoxia (HCC) Active Problems:   CAP (community acquired pneumonia)   Venous ulcer of left leg (HCC)   COPD with chronic bronchitis (HCC)   CKD (chronic kidney disease) stage 4, GFR 15-29 ml/min (HCC)   AKI (acute kidney injury) (HCC)   Chronic systolic CHF (congestive heart failure) ([place])   History  of 2019 novel coronavirus disease (COVID-19)   Anemia   Acute respiratory failure (HCC)   Hyperglycemia due to type 1 diabetes mellitus (HCC)   Hx of CABG   Non-ST elevation (NSTEMI) myocardial infarction (HCC)   Acute on chronic heart failure with preserved ejection fraction (HFpEF) (HCC)   Acute renal failure with acute renal cortical necrosis superimposed on stage 3 chronic kidney disease ([place])   LOS: 6 days   How to contact the [facility] Attending or  Consulting provider [place] or covering provider during after hours [place], for this patient?  1. Check the care team in [facility] and look for a) attending/consulting TRH provider listed and b) the [facility] team listed 2. Log into [email or web address] and use [place]'s universal password to access. If you do not have the password, please contact the hospital operator. 3. Locate the [facility] provider you are looking for under [facility] and page to a number that you can be directly reached. 4. If you still have difficulty reaching the provider, please page the [facility] (Director on Call) for the Hospitalists listed on amion for assistance.

## 2019-06-27 NOTE — Progress Notes (Signed)
Inpatient Diabetes Program Recommendations  AACE/ADA: New Consensus Statement on Inpatient Glycemic Control (2015)  Target Ranges:  Prepandial:   less than 140 mg/dL      Peak postprandial:   less than 180 mg/dL (1-2 hours)      Critically ill patients:  140 - 180 mg/dL   Lab Results  Component Value Date   GLUCAP 305 (H) 06/27/2019   HGBA1C 6.8 (H) 06/21/2019    Review of Glycemic Control  Results for Bryce Garcia, Bryce Garcia (MRN [id number]) as of 06/27/2019 13:49  Ref. Range 06/26/2019 12:02 06/26/2019 17:22 06/26/2019 22:17 06/27/2019 07:54 06/27/2019 11:27  Glucose-Capillary Latest Ref Range: 70 - 99 mg/dL 216 (H) 276 (H) 238 (H) 177 (H) 305 (H)    Diabetes history: T1D Outpatient Diabetes medications:NPH Insulin 5 units AM/ 19 units QHS                             Regular Insulin 5-20 units TID with meals  Current orders for Inpatient glycemic control: Levemir 25 units QHS                            Novolog Resistant Correction Scale/ SSI (0-20 units) TID;                             0-5 HS;  Novolog 4 units TID with meals  Please consider- -Novolog 8 units with meals TID if eats at least 50% of meal  Thank you, Geoffry Paradise, RN, BSN Diabetes Coordinator Inpatient Diabetes Program [phone] (team pager from 8a-5p)   Inpatient Diabetes Program Recommendations:

## 2019-06-27 NOTE — Progress Notes (Signed)
[facility]  ROUNDING NOTE   Subjective:   Patient is doing fair today Sitting up in chair.   States his appetite is good. Denies any nausea or vomiting No shortness of breath LE edema appears to be improving slowly   Objective:  Vital signs in last 24 hours:  Temp:  [98.4 F (36.9 C)-98.5 F (36.9 C)] 98.5 F (36.9 C) (12/30 0421) Pulse Rate:  [69-86] 86 (12/30 0421) Resp:  [16-20] 20 (12/29 2221) BP: (125-153)/(67-78) 153/69 (12/30 0421) SpO2:  [74 %-95 %] 93 % (12/30 0421)  Weight change:  Filed Weights   06/21/19 0150 06/23/19 1530  Weight: 88.5 kg 89.5 kg    Intake/Output: I/O last 3 completed shifts: In: 240 [P.O.:240] Out: 2475 [Urine:2475]   Intake/Output this shift:  Total I/O In: -  Out: 300 [Urine:300]  Physical Exam: General: NAD, sitting up in side of bed  Head: Normocephalic, atraumatic. Moist oral mucosal membranes  Eyes: Anicteric,   Lungs:   Clear to auscultation bilaterally, [place] O2  Heart: Regular rate and rhythm, 2/6 murmur  Abdomen:  Soft, nontender,   Extremities:  peripheral edema up to mid shins  Neurologic:  Alert, oriented  Skin: Left lower extremity ulcer        Basic Metabolic Panel: Recent Labs  Lab 06/23/19 0317 06/24/19 0633 06/25/19 0718 06/26/19 0309 06/27/19 0128  NA 136 138 139 138 138  K 4.5 4.1 3.8 4.3 4.1  CL 102 103 105 104 104  CO2 18* 23 23 23 22   GLUCOSE 118* 141* 127* 188* 217*  BUN 90* 65* 64* 64* 63*  CREATININE 4.62* 3.40* 3.36* 3.52* 3.47*  CALCIUM 8.4* 8.1* 8.1* 8.4* 8.6*  PHOS  --  4.1 4.1  --  4.1    Liver Function Tests: Recent Labs  Lab 06/24/19 0633 06/25/19 0718 06/27/19 0128  ALBUMIN 2.7* 2.4* 2.4*   No results for input(s): LIPASE, AMYLASE in the last 168 hours. No results for input(s): AMMONIA in the last 168 hours.  CBC: Recent Labs  Lab 06/21/19 0154 06/22/19 0506 06/23/19 0630 06/24/19 0632 06/25/19 0718 06/26/19 0309  WBC 12.5* 13.3* 19.4* 13.4* 10.2 10.3   NEUTROABS 10.2*  --   --   --   --   --   HGB 9.1* 8.6* 9.1* 8.8* 8.3* 8.1*  HCT 28.3* 26.4* 26.8* 26.6* 24.8* 24.8*  MCV 90.4 90.1 87.0 90.2 87.9 89.9  PLT 349 350 375 366 336 356    Cardiac Enzymes: No results for input(s): CKTOTAL, CKMB, CKMBINDEX, TROPONINI in the last 168 hours.  BNP: Invalid input(s): POCBNP  CBG: Recent Labs  Lab 06/26/19 0810 06/26/19 1202 06/26/19 1722 06/26/19 2217 06/27/19 0754  GLUCAP 149* 216* 276* 238* 177*    Microbiology: Results for orders placed or performed during the hospital encounter of 06/21/19  Blood Culture (routine x 2)     Status: None   Collection Time: 06/21/19  2:08 AM   Specimen: BLOOD  Result Value Ref Range Status   Specimen Description BLOOD BLOOD RIGHT FOREARM  Final   Special Requests   Final    BOTTLES DRAWN AEROBIC AND ANAEROBIC Blood Culture adequate volume   Culture   Final    NO GROWTH 5 DAYS Performed at [facility], [address] [place], [place] [address]    Report Status 06/26/2019 FINAL  Final  Blood Culture (routine x 2)     Status: None   Collection Time: 06/21/19  2:13 AM   Specimen:  BLOOD  Result Value Ref Range Status   Specimen Description BLOOD LEFT ANTECUBITAL  Final   Special Requests   Final    BOTTLES DRAWN AEROBIC AND ANAEROBIC Blood Culture results may not be optimal due to an inadequate volume of blood received in culture bottles   Culture   Final    NO GROWTH 5 DAYS Performed at [facility] [address] [place], [place] [address]    Report Status 06/26/2019 FINAL  Final  Respiratory Panel by PCR     Status: None   Collection Time: 06/21/19 11:56 AM   Specimen: Nasopharyngeal Swab; Respiratory  Result Value Ref Range Status   Adenovirus NOT DETECTED NOT DETECTED Final   Coronavirus 229E NOT DETECTED NOT DETECTED Final    Comment: (NOTE) The Coronavirus on the Respiratory Panel, DOES NOT test for the novel  Coronavirus (2019 nCoV)    Coronavirus HKU1  NOT DETECTED NOT DETECTED Final   Coronavirus NL63 NOT DETECTED NOT DETECTED Final   Coronavirus OC43 NOT DETECTED NOT DETECTED Final   Metapneumovirus NOT DETECTED NOT DETECTED Final   Rhinovirus / Enterovirus NOT DETECTED NOT DETECTED Final   Influenza A NOT DETECTED NOT DETECTED Final   Influenza B NOT DETECTED NOT DETECTED Final   Parainfluenza Virus 1 NOT DETECTED NOT DETECTED Final   Parainfluenza Virus 2 NOT DETECTED NOT DETECTED Final   Parainfluenza Virus 3 NOT DETECTED NOT DETECTED Final   Parainfluenza Virus 4 NOT DETECTED NOT DETECTED Final   Respiratory Syncytial Virus NOT DETECTED NOT DETECTED Final   Bordetella pertussis NOT DETECTED NOT DETECTED Final   Chlamydophila pneumoniae NOT DETECTED NOT DETECTED Final   Mycoplasma pneumoniae NOT DETECTED NOT DETECTED Final    Comment: Performed at [facility], [place]. [address], [place], [place] [address]  Urine culture     Status: None   Collection Time: 06/23/19  9:55 AM   Specimen: In/Out Cath Urine  Result Value Ref Range Status   Specimen Description   Final    IN/OUT CATH URINE Performed at [facility], [address] [place], [place] [address]    Special Requests   Final    NONE Performed at [facility], [address], [place], [place] [address]    Culture   Final    NO GROWTH Performed at [facility], [place] [address], [place], [place] [address]    Report Status 06/25/2019 FINAL  Final    Coagulation Studies: No results for input(s): LABPROT, INR in the last 72 hours.  Urinalysis: No results for input(s): COLORURINE, LABSPEC, PHURINE, GLUCOSEU, HGBUR, BILIRUBINUR, KETONESUR, PROTEINUR, UROBILINOGEN, NITRITE, LEUKOCYTESUR in the last 72 hours.  Invalid input(s): APPERANCEUR    Imaging: No results found.   Medications:    . albuterol  2 puff Inhalation Q6H  . amLODipine  10 mg Oral Daily  . aspirin EC  81 mg Oral Daily  . atorvastatin  80 mg Oral Daily  . carvedilol   25 mg Oral BID  . DULoxetine  30 mg Oral Daily  . heparin injection (subcutaneous)  5,000 Units Subcutaneous Q8H  . insulin aspart  0-20 Units Subcutaneous TID WC  . insulin aspart  0-5 Units Subcutaneous QHS  . insulin aspart  4 Units Subcutaneous TID WC  . insulin detemir  25 Units Subcutaneous QHS  . levothyroxine  100 mcg Oral Daily  . multivitamins with iron  1 tablet Oral Daily  . pentoxifylline  400 mg Oral TID  .  torsemide  40 mg Oral Daily   alum & mag hydroxide-simeth, ipratropium-albuterol, oxyCODONE, traMADol, traZODone  Assessment/ Plan:  [name] is a 68 y.o. white male with insulin dependent diabetes mellitus type II, hypertension, coronary artery disease status post CABG, COPD, COVID-19 infection 04/2019, chronic venous stasis ulcer, , who was admitted to [facility] on 06/21/2019 for acute exacerbation of systolic congestive heart failure. Echocardiogram 3/8 at [facility] with EF of 50%.   1. Acute kidney failure on chronic kidney disease stage IV with proteinuria: baseline creatinine of 2.85, GFR of 22 on 12/18/18.  Chronic kidney disease secondary to diabetic nephropathy. Followed by [facility], Dr. Alfonse Alpers Acute kidney injury secondary to acute cardiorenal syndrome. Not on an ACE-I/ARB at home.  - Patient requiring temporary dialysis. First treatment on 12/16.   Electrolytes and volume status are acceptable.  No acute indication for dialysis at present.   Follow up outpatient with [facility] nephrology   2. Hypertension with acute exacerbation of diastolic congestive heart failure.  Significant lower extremity edema/volume overload -  torsemide 40 mg daily - PT noted drop in sats with ambulation on room air yesterday   3. Anemia with chronic kidney disease: normocytic.  Lab Results  Component Value Date   HGB 8.1 (L) 06/26/2019  iron studies pending   4. DM-2 with CKD Insulin dependent Lab Results  Component Value Date   HGBA1C 6.8 (H) 06/21/2019      LOS:  6 Bailynn Dyk 12/30/202010:36 AM

## 2019-06-27 NOTE — Progress Notes (Signed)
SATURATION QUALIFICATIONS: (This note is used to comply with regulatory documentation for home oxygen)  Patient Saturations on Room Air at Rest = 92%  Patient Saturations on Room Air while Ambulating = 81%  Patient Saturations on 3 Liters of oxygen while Ambulating = 91%  Please briefly explain why patient needs home oxygen:

## 2019-06-28 LAB — GLUCOSE, CAPILLARY
Glucose-Capillary: 223 mg/dL — ABNORMAL HIGH (ref 70–99)
Glucose-Capillary: 307 mg/dL — ABNORMAL HIGH (ref 70–99)

## 2019-06-28 MED ORDER — INSULIN ASPART 100 UNIT/ML ~~LOC~~ SOLN
SUBCUTANEOUS | Status: AC
  Filled 2019-06-28: qty 1

## 2019-06-28 MED ORDER — TORSEMIDE 20 MG PO TABS: 40.0000 mg | ORAL_TABLET | Freq: Every day | ORAL | 1 refills | Status: DC

## 2019-06-28 NOTE — Discharge Summary (Addendum)
Physician Discharge Summary  Bryce Garcia [id number] DOB: June 23, 1951 DOA: 06/21/2019  PCP: [place] date: 06/21/2019 Discharge date: 06/28/2019  Recommendations for Outpatient Follow-up:  1. Patient will arrange follow-up with his cardiologist and nephrologist to follow-up acute kidney injury, acute hypoxic respiratory failure and NSTEMI 2. Wean oxygen as tolerated  Follow-up Information    [place], [place] .   Contact information: [place] [address] [phone]            Discharge Diagnoses: Principal diagnosis is #1 1. Acute kidney injury superimposed on chronic kidney disease stage IV with proteinuria, secondary to diabetic nephropathy, acute kidney injury secondary to acute cardiorenal syndrome, required temporary dialysis  2. Acute hypoxic respiratory failure, dropped to 81% with ambulation, utilize 3-4 L with mobility.  Community-acquired pneumonia treated with IV antibiotics. 3. Anemia of CKD 4. NSTEMI in the setting of acute respiratory distress. 5. Acute on chronic diastolic CHF 6. Diabetes mellitus type 2 with CKD 7. Status post COVID-19 infection 04/2019   Discharge Condition: improved Disposition: home  Diet recommendation: Heart healthy, diabetic diet  Filed Weights   06/21/19 0150 06/23/19 1530  Weight: 88.5 kg 89.5 kg    History of present illness:  68 year old man complex past medical history including COVID-19 infection approximately 4 weeks or more prior to presentation, presented with progressive shortness of breath, found to be hypoxic 68% on room air which improved to 94% on 4 L.  Patient admitted for NSTEMI, acute hypoxic respiratory failure, community-acquired pneumonia, acute on chronic systolic heart failure.   Hospital Course:   He was treated conservatively with heparin and pharmacotherapy per cardiology.  Catheterization  deferred secondary to renal failure nuclear stress testing deferred secondary to inability to lie flat.  Treated both by cardiology and nephrology for heart failure and worsening acute injury that progressed to failure requiring temporary dialysis.  Fortunately renal function stabilized and dialysis was stopped.  Renal function has been stable on diuretics, he was followed by cardiology and nephrology and cleared for discharge with outpatient follow-up.  Individual issues as below.  Acute kidney injury superimposed on chronic kidney disease stage IV with proteinuria, secondary to diabetic nephropathy, acute kidney injury secondary to acute cardiorenal syndrome, required temporary dialysis starting 12/16 --Currently not on dialysis.  Follow-up with [facility] nephrology as an outpatient. --Continue torsemide 40 mg daily  Acute hypoxic respiratory failure, dropped to 81% with ambulation, utilize 3-4 L with mobility.  Community-acquired pneumonia treated with IV antibiotics. --needs home oxygen, will arrange.  Presumably secondary to CHF, NSTEMI  Anemia of CKD --Stable.  Follow-up as an outpatient.  NSTEMI in the setting of acute respiratory distress.  PMH includes CABG, PCI.  Echocardiogram LVEF 50-55%, no regional wall motion abnormalities.  Intervention was deferred given minimal symptoms of angina and renal failure.  Treated with heparin, beta-blocker, aspirin, statin. --Asymptomatic.  Continue aspirin 81 daily, atorvastatin 80 mg daily, carvedilol 25 mg twice daily, amlodipine 10 mg daily.  Clopidogrel deferred secondary to anemia.  Cardiology will arrange outpatient follow-up with [facility].  Acute on chronic diastolic CHF --Continue torsemide as per cardiology and nephrology.  Diabetes mellitus type 2 with CKD --NovoLog 8 units 3 times daily with meals.  Continue Levemir sliding scale insulin.  Left lower extremity wound present on admission secondary to an accident with wood --Continue wound  care  Status post COVID-19 infection 04/2019  Significant Hospital Events  12/24 admitted for acute hypoxic respiratory failure, multifactorial, NSTEMI  12/26 with worsening renal failure, started temporary hemodialysis  Consults:   12/24 cardiology  12/24 nephrology  Procedures:   12/26 triple-lumen dialysis catheter placement  Significant Diagnostic Tests:   12/24 echocardiogram LVEF 50-55%.  Normal LV function.  Grade 2 diastolic dysfunction.  Global right ventricular systolic function normal  12/24 renal ultrasound no hydronephrosis, medical renal disease  Micro Data:   Blood cultures no growth, final  SARS Covid negative   Today's assessment: S: Feels well, ambulating well, eating well.  No complaints.  Breathing fine. O: Vitals:  Vitals:   06/28/19 1236 06/28/19 1423  BP: (!) 155/70   Pulse: 96   Resp: 18   Temp: 98.6 F (37 C)   SpO2: 96% 94%    Constitutional:  . Appears calm and comfortable Respiratory:  . CTA bilaterally, no w/r/r.  . Respiratory effort normal. No retractions or accessory muscle use Cardiovascular:  . RRR, no m/r/g . No significant LE extremity edema   Psychiatric:  . Mental status o Mood, affect appropriate  CBG stable Creatinine stable at 3.52, BUN stable at 64  Discharge Instructions  Discharge Instructions    (HEART FAILURE PATIENTS) Call MD:  Anytime you have any of the following symptoms: 1) 3 pound weight gain in 24 hours or 5 pounds in 1 week 2) shortness of breath, with or without a dry hacking cough 3) swelling in the hands, feet or stomach 4) if you have to sleep on extra pillows at night in order to breathe.   Complete by: As directed    Diet - low sodium heart healthy   Complete by: As directed    Diet Carb Modified   Complete by: As directed    Discharge instructions   Complete by: As directed    Call your physician or seek immediate medical attention for swelling, pain, weight gain, shortness of  breath, confusion, or worsening of condition.   Increase activity slowly   Complete by: As directed      Allergies as of 06/28/2019      Reactions   Other Other (See Comments)   Hypoglycemia unawareness Hypoglycemia unawareness      Medication List    STOP taking these medications   furosemide 80 MG tablet Commonly known as: LASIX   gabapentin 300 MG capsule Commonly known as: NEURONTIN     TAKE these medications   amLODipine 10 MG tablet Commonly known as: NORVASC Take 10 mg by mouth daily.   aspirin 81 MG EC tablet Take by mouth.   atorvastatin 80 MG tablet Commonly known as: LIPITOR Take 80 mg by mouth daily.   carvedilol 25 MG tablet Commonly known as: COREG Take 25 mg by mouth 2 (two) times daily.   clopidogrel 75 MG tablet Commonly known as: PLAVIX Take 75 mg by mouth daily.   DULoxetine 30 MG capsule Commonly known as: CYMBALTA Take 30 mg by mouth daily.   Euthyrox 100 MCG tablet Generic drug: levothyroxine Take 100 mcg by mouth daily.   insulin NPH Human 100 UNIT/ML injection Commonly known as: NOVOLIN N Inject 5 units sq qam and 19 units sq q hs, adjust as directed up to 30 units daily   insulin regular 100 units/mL injection Commonly known as: NOVOLIN R Inject 5-20 Units into the skin 3 (three) times daily before meals. Depending on blood glucose level   pentoxifylline 400 MG CR tablet Commonly known as: TRENTAL Take 400 mg  by mouth 3 (three) times daily.   torsemide 20 MG tablet Commonly known as: DEMADEX Take 2 tablets (40 mg total) by mouth daily. Start taking on: June 29, 2019   traMADol 50 MG tablet Commonly known as: ULTRAM Take 50 mg by mouth every 6 (six) hours as needed.            Durable Medical Equipment  (From admission, onward)         Start     Ordered   06/28/19 0717  For home use only DME oxygen  Once    Question Answer Comment  Length of Need 6 Months   Mode or (Route) Nasal cannula   Liters per Minute  3   Frequency Continuous (stationary and portable oxygen unit needed)   Oxygen delivery system Gas      06/28/19 0717         Allergies  Allergen Reactions  . Other Other (See Comments)    Hypoglycemia unawareness Hypoglycemia unawareness     The results of significant diagnostics from this hospitalization (including imaging, microbiology, ancillary and laboratory) are listed below for reference.    Significant Diagnostic Studies: US RENAL  Result Date: 06/21/2019 CLINICAL DATA:  Acute kidney injury. Stage 4 chronic kidney disease. EXAM: RENAL / URINARY TRACT ULTRASOUND COMPLETE COMPARISON:  Chest CTA dated 10/27/2013. FINDINGS: Right Kidney: Renal measurements: 9.5 x 4.6 x 4.6 cm = volume: 106 mL. Mildly echogenic. No hydronephrosis. Diffuse cortical thinning with prominent renal sinus fat. Left Kidney: Renal measurements: 8.7 x 4.6 x 4.5 cm = volume: 94 mL. Borderline echogenic. No hydronephrosis. Bladder: Appears normal for degree of bladder distention. Other: None. IMPRESSION: 1. Mildly echogenic right kidney and borderline echogenic left kidney, compatible with medical renal disease. 2. No hydronephrosis. 3. Bilateral renal atrophy, greater on the right. Electronically Signed   By: Claudie Revering M.D.   On: 06/21/2019 11:56   DG Chest Portable 1 View  Result Date: 06/21/2019 CLINICAL DATA:  COVID-19 diagnosed 1 month prior, abnormal breathing since EXAM: PORTABLE CHEST 1 VIEW COMPARISON:  Chest radiograph 01/07/2014 FINDINGS: Diffuse interstitial and multifocal areas of airspace opacity with more dense consolidative opacity in the left mid lung. No pneumothorax. Small bilateral effusions. Prominence of the cardiac silhouette likely related to low volumes and portable technique. Postsurgical changes related to prior CABG including intact and aligned sternotomy wires and multiple surgical clips projecting over the mediastinum. No acute osseous or soft tissue abnormality. Telemetry leads  overlie the chest. IMPRESSION: 1. Diffuse interstitial and multifocal areas of airspace opacity with more dense consolidative opacity in the left mid lung, compatible with multifocal pneumonia. 2. Small bilateral effusions. Electronically Signed   By: Lovena Le M.D.   On: 06/21/2019 02:47   ECHOCARDIOGRAM COMPLETE  Result Date: 06/21/2019   ECHOCARDIOGRAM REPORT   Patient Name:   HATEM PARLAPIANO Date of Exam: 06/21/2019 Medical Rec #:  [id number]      Height:       68.0 in Accession #:    [id number]     Weight:       195.0 lb Date of Birth:  06-May-1951      BSA:          2.02 m Patient Age:    88 years       BP:           139/65 mmHg Patient Gender: M              HR:  72 bpm. Exam Location:  [facility] Procedure: 2D Echo, Cardiac Doppler and Color Doppler Indications:     NSTEMI I21.4  History:         Patient has no prior history of Echocardiogram examinations.                  CAD; Arrythmias:LBBB.  Sonographer:     Alyse Low Roar Referring Phys:  [id number] Athena Masse Diagnosing Phys: Ida Rogue MD IMPRESSIONS  1. Left ventricular ejection fraction, by visual estimation, is 50 to 55%. The left ventricle has normal function. There is no left ventricular hypertrophy.  2. Left ventricular diastolic parameters are consistent with Grade II diastolic dysfunction (pseudonormalization).  3. The left ventricle has no regional wall motion abnormalities.  4. Global right ventricle has normal systolic function.The right ventricular size is normal. No increase in right ventricular wall thickness.  5. Left atrial size was mildly dilated.  6. Moderate mitral valve regurgitation.  7. TR signal is inadequate for assessing pulmonary artery systolic pressure. FINDINGS  Left Ventricle: Left ventricular ejection fraction, by visual estimation, is 50 to 55%. The left ventricle has normal function. The left ventricle has no regional wall motion abnormalities. There is no left ventricular hypertrophy. Left ventricular  diastolic parameters are consistent with Grade II diastolic dysfunction (pseudonormalization). Normal left atrial pressure. Right Ventricle: The right ventricular size is normal. No increase in right ventricular wall thickness. Global RV systolic function is has normal systolic function. Left Atrium: Left atrial size was mildly dilated. Right Atrium: Right atrial size was normal in size Pericardium: There is no evidence of pericardial effusion. Mitral Valve: The mitral valve is normal in structure. Moderate mitral valve regurgitation. No evidence of mitral valve stenosis by observation. Tricuspid Valve: The tricuspid valve is normal in structure. Tricuspid valve regurgitation is not demonstrated. Aortic Valve: The aortic valve is normal in structure. Aortic valve regurgitation is not visualized. Mild to moderate aortic valve sclerosis/calcification is present, without any evidence of aortic stenosis. Aortic valve mean gradient measures 3.0 mmHg. Aortic valve peak gradient measures 5.9 mmHg. Aortic valve area, by VTI measures 2.43 cm. Pulmonic Valve: The pulmonic valve was normal in structure. Pulmonic valve regurgitation is not visualized. Pulmonic regurgitation is not visualized. Aorta: The aortic root, ascending aorta and aortic arch are all structurally normal, with no evidence of dilitation or obstruction. Venous: The inferior vena cava is dilated in size with greater than 50% respiratory variability, suggesting right atrial pressure of 8 mmHg. IAS/Shunts: No atrial level shunt detected by color flow Doppler. There is no evidence of a patent foramen ovale. No ventricular septal defect is seen or detected. There is no evidence of an atrial septal defect.  LEFT VENTRICLE PLAX 2D LVIDd:         4.72 cm  Diastology LVIDs:         3.81 cm  LV e' lateral:   10.40 cm/s LV PW:         1.15 cm  LV E/e' lateral: 10.8 LV IVS:        1.02 cm  LV e' medial:    4.82 cm/s LVOT diam:     2.00 cm  LV E/e' medial:  23.2 LV SV:          41 ml LV SV Index:   19.70 LVOT Area:     3.14 cm  RIGHT VENTRICLE RV Basal diam:  2.89 cm LEFT ATRIUM  Index       RIGHT ATRIUM           Index LA diam:      4.50 cm  2.23 cm/m  RA Area:     16.60 cm LA Vol (A2C): 77.8 ml  38.48 ml/m RA Volume:   45.40 ml  22.45 ml/m LA Vol (A4C): 106.0 ml 52.43 ml/m  AORTIC VALVE                   PULMONIC VALVE AV Area (Vmax):    2.08 cm    PV Vmax:        0.98 m/s AV Area (Vmean):   2.49 cm    PV Peak grad:   3.8 mmHg AV Area (VTI):     2.43 cm    RVOT Peak grad: 1 mmHg AV Vmax:           121.00 cm/s AV Vmean:          76.100 cm/s AV VTI:            0.226 m AV Peak Grad:      5.9 mmHg AV Mean Grad:      3.0 mmHg LVOT Vmax:         80.00 cm/s LVOT Vmean:        60.300 cm/s LVOT VTI:          0.175 m LVOT/AV VTI ratio: 0.77  AORTA Ao Root diam: 2.90 cm MITRAL VALVE MV Area (PHT): 3.60 cm              SHUNTS MV PHT:        61.19 msec            Systemic VTI:  0.18 m MV Decel Time: 211 msec              Systemic Diam: 2.00 cm MV E velocity: 112.00 cm/s 103 cm/s MV A velocity: 72.20 cm/s  70.3 cm/s MV E/A ratio:  1.55        1.5  Ida Rogue MD Electronically signed by Ida Rogue MD Signature Date/Time: 06/21/2019/2:10:13 PM    Final     Microbiology: Recent Results (from the past 240 hour(s))  Blood Culture (routine x 2)     Status: None   Collection Time: 06/21/19  2:08 AM   Specimen: BLOOD  Result Value Ref Range Status   Specimen Description BLOOD BLOOD RIGHT FOREARM  Final   Special Requests   Final    BOTTLES DRAWN AEROBIC AND ANAEROBIC Blood Culture adequate volume   Culture   Final    NO GROWTH 5 DAYS Performed at [facility], [address] [place], [place] [address]    Report Status 06/26/2019 FINAL  Final  Blood Culture (routine x 2)     Status: None   Collection Time: 06/21/19  2:13 AM   Specimen: BLOOD  Result Value Ref Range Status   Specimen Description BLOOD LEFT ANTECUBITAL  Final   Special Requests    Final    BOTTLES DRAWN AEROBIC AND ANAEROBIC Blood Culture results may not be optimal due to an inadequate volume of blood received in culture bottles   Culture   Final    NO GROWTH 5 DAYS Performed at [facility], [address] [place], [place] [address]    Report Status 06/26/2019 FINAL  Final  Respiratory Panel by PCR     Status: None   Collection Time: 06/21/19 11:56 AM  Specimen: Nasopharyngeal Swab; Respiratory  Result Value Ref Range Status   Adenovirus NOT DETECTED NOT DETECTED Final   Coronavirus 229E NOT DETECTED NOT DETECTED Final    Comment: (NOTE) The Coronavirus on the Respiratory Panel, DOES NOT test for the novel  Coronavirus (2019 nCoV)    Coronavirus HKU1 NOT DETECTED NOT DETECTED Final   Coronavirus NL63 NOT DETECTED NOT DETECTED Final   Coronavirus OC43 NOT DETECTED NOT DETECTED Final   Metapneumovirus NOT DETECTED NOT DETECTED Final   Rhinovirus / Enterovirus NOT DETECTED NOT DETECTED Final   Influenza A NOT DETECTED NOT DETECTED Final   Influenza B NOT DETECTED NOT DETECTED Final   Parainfluenza Virus 1 NOT DETECTED NOT DETECTED Final   Parainfluenza Virus 2 NOT DETECTED NOT DETECTED Final   Parainfluenza Virus 3 NOT DETECTED NOT DETECTED Final   Parainfluenza Virus 4 NOT DETECTED NOT DETECTED Final   Respiratory Syncytial Virus NOT DETECTED NOT DETECTED Final   Bordetella pertussis NOT DETECTED NOT DETECTED Final   Chlamydophila pneumoniae NOT DETECTED NOT DETECTED Final   Mycoplasma pneumoniae NOT DETECTED NOT DETECTED Final    Comment: Performed at [facility], [place]. [address] [place], [place] [address]  Urine culture     Status: None   Collection Time: 06/23/19  9:55 AM   Specimen: In/Out Cath Urine  Result Value Ref Range Status   Specimen Description   Final    IN/OUT CATH URINE Performed at [facility], [address], [place], [place] [address]    Special Requests   Final    NONE Performed at [facility], [address], [place], [place] [address]    Culture   Final    NO GROWTH Performed at [facility], [place] [address], [place], [place] [address]    Report Status 06/25/2019 FINAL  Final     Labs: Basic Metabolic Panel: Recent Labs  Lab 06/23/19 0317 06/24/19 0633 06/25/19 0718 06/26/19 0309 06/27/19 0128  NA 136 138 139 138 138  K 4.5 4.1 3.8 4.3 4.1  CL 102 103 105 104 104  CO2 18* 23 23 23 22   GLUCOSE 118* 141* 127* 188* 217*  BUN 90* 65* 64* 64* 63*  CREATININE 4.62* 3.40* 3.36* 3.52* 3.47*  CALCIUM 8.4* 8.1* 8.1* 8.4* 8.6*  PHOS  --  4.1 4.1  --  4.1   Liver Function Tests: Recent Labs  Lab 06/24/19 0633 06/25/19 0718 06/27/19 0128  ALBUMIN 2.7* 2.4* 2.4*   CBC: Recent Labs  Lab 06/22/19 0506 06/23/19 0630 06/24/19 0632 06/25/19 0718 06/26/19 0309  WBC 13.3* 19.4* 13.4* 10.2 10.3  HGB 8.6* 9.1* 8.8* 8.3* 8.1*  HCT 26.4* 26.8* 26.6* 24.8* 24.8*  MCV 90.1 87.0 90.2 87.9 89.9  PLT 350 375 366 336 356    Recent Labs    06/21/19 0155  BNP 1,167.0*    CBG: Recent Labs  Lab 06/27/19 1127 06/27/19 1647 06/27/19 2143 06/28/19 0729 06/28/19 1129  GLUCAP 305* 233* 160* 223* 307*    Principal Problem:   Acute respiratory failure with hypoxia (HCC) Active Problems:   CAP (community acquired pneumonia)   Venous ulcer of left leg (HCC)   COPD with chronic bronchitis (HCC)   CKD (chronic kidney disease) stage 4, GFR 15-29 ml/min (HCC)   AKI (acute kidney injury) (HCC)   Chronic systolic CHF (congestive heart failure) ([place])   History of 2019 novel coronavirus disease (COVID-19)   Anemia   Acute respiratory failure (HCC)  Hyperglycemia due to type 1 diabetes mellitus (HCC)   Hx of CABG   Non-ST elevation (NSTEMI) myocardial infarction (HCC)   Acute on chronic heart failure with preserved ejection fraction (HFpEF) ([place])   Acute renal failure with acute renal cortical necrosis superimposed on stage 3 chronic kidney disease  ([place])   Time coordinating discharge: 35 minutes  Signed:  Murray Hodgkins, MD  [facility]  06/28/2019, 7:32 PM

## 2019-06-28 NOTE — TOC Transition Note (Signed)
Transition of Care [facility]) - CM/SW Discharge Note   Patient Details  Name: LOCHLYN PALLETT MRN: [id number] Date of Birth: [birth year]  Transition of [facility]) CM/SW Contact:  Beverly Sessions, RN Phone Number: 06/28/2019, 5:42 PM   Clinical Narrative:     Patient discharge home today.  Portable O2 was delivered prior to discharge  [place] with [facility] notified of discharge    Barriers to Discharge: Continued Medical Work up   Patient Goals and CMS Choice Patient states their goals for this hospitalization and ongoing recovery are:: to get back home once I am feeling better      Discharge Placement                       Discharge Plan and Services   Discharge Planning Services: CM Consult Post Acute Care Choice: Home Health                    HH Arranged: RN, PT [facility] Agency: [facility] Date [facility] Agency Contacted: 06/27/19 Time [place]: 1550 Representative spoke with at [place]: [place]) Interventions     Readmission Risk Interventions No flowsheet data found.

## 2019-06-28 NOTE — Care Management Important Message (Signed)
Important Message  Patient Details  Name: Bryce Garcia MRN: [id number] Date of Birth: 01/17/1951   Medicare Important Message Given:  Yes     Dannette Barbara 06/28/2019, 11:20 AM

## 2019-06-28 NOTE — Progress Notes (Signed)
Patient given education instruction on his new medication torsemide. PIV x2 removed. Excess wound supplies sent home with patient in bag. Patient transported to lobby via wheelchair and will be picked up by son Bryce Garcia.

## 2019-06-28 NOTE — Progress Notes (Signed)
Physical Therapy Treatment Patient Details Name: Bryce Garcia MRN: [id number] DOB: 02/14/1951 Today's Date: 06/28/2019    History of Present Illness This 68 y/o male was adm for PNA post-covid. PMH includes:  Covid infection, COPD, CAD s/p 4 vessel CABG, DM, CHF, CKD4, EF 50%. Of note, Pt recieving HD via R femoral vein central line while in acute setting, removed 12/29.    PT Comments    Patient received in bed, agrees to PT session. Reports he hopes to go home today. Patient performed supine to sit without assistance. Transfers sit to stand with supervision. Ambulated 100 feet with RW and min guard. O2 donned at 2 lpm. Sats at 94% when returned to sitting on side of bed. MD present for end of session and with patient when I left. Patient will benefit from continued skilled PT while here to improve strength and functional independence.     Follow Up Recommendations  Home health PT;Supervision for mobility/OOB     Equipment Recommendations  None recommended by PT    Recommendations for Other Services       Precautions / Restrictions Precautions Precautions: Fall Restrictions Weight Bearing Restrictions: No    Mobility  Bed Mobility Overal bed mobility: Independent Bed Mobility: Supine to Sit     Supine to sit: Modified independent (Device/Increase time)        Transfers Overall transfer level: Needs assistance Equipment used: Rolling walker (2 wheeled) Transfers: Sit to/from Stand Sit to Stand: Supervision            Ambulation/Gait Ambulation/Gait assistance: Min guard Gait Distance (Feet): 100 Feet Assistive device: Rolling walker (2 wheeled) Gait Pattern/deviations: Step-through pattern Gait velocity: decreased       Stairs             Wheelchair Mobility    Modified Rankin (Stroke Patients Only)       Balance Overall balance assessment: Needs assistance Sitting-balance support: Feet supported Sitting balance-Leahy Scale: Good      Standing balance support: Bilateral upper extremity supported;During functional activity Standing balance-Leahy Scale: Good                              Cognition Arousal/Alertness: Awake/alert Behavior During Therapy: WFL for tasks assessed/performed Overall Cognitive Status: Within Functional Limits for tasks assessed                                        Exercises      General Comments        Pertinent Vitals/Pain Pain Assessment: Faces Faces Pain Scale: Hurts a little bit Pain Location: leg/wound Pain Descriptors / Indicators: Sore Pain Intervention(s): Monitored during session    Home Living                      Prior Function            PT Goals (current goals can now be found in the care plan section) Acute Rehab PT Goals Patient Stated Goal: to get home and get stronger PT Goal Formulation: With patient Time For Goal Achievement: 07/07/19 Potential to Achieve Goals: Good Progress towards PT goals: Progressing toward goals    Frequency    Min 2X/week      PT Plan Current plan remains appropriate    Co-evaluation  AM-PAC PT "6 Clicks" Mobility   Outcome Measure  Help needed turning from your back to your side while in a flat bed without using bedrails?: None Help needed moving from lying on your back to sitting on the side of a flat bed without using bedrails?: None Help needed moving to and from a bed to a chair (including a wheelchair)?: A Little Help needed standing up from a chair using your arms (e.g., wheelchair or bedside chair)?: A Little Help needed to walk in hospital room?: A Little Help needed climbing 3-5 steps with a railing? : A Little 6 Click Score: 20    End of Session Equipment Utilized During Treatment: Gait belt;Oxygen Activity Tolerance: Patient tolerated treatment well Patient left: in bed;with call bell/phone within reach;Other (comment)(MD present) Nurse  Communication: Mobility status PT Visit Diagnosis: Muscle weakness (generalized) (M62.81);Difficulty in walking, not elsewhere classified (R26.2)     Time: [id number] PT Time Calculation (min) (ACUTE ONLY): 15 min  Charges:  $Gait Training: 8-22 mins                     Torie Priebe, PT, GCS 06/28/19,2:29 PM

## 2019-06-28 NOTE — Progress Notes (Signed)
Occupational Therapy Treatment Patient Details Name: Bryce Garcia MRN: [id number] DOB: 20-Apr-1951 Today's Date: 06/28/2019    History of present illness This 68 y/o male was adm for PNA post-covid. PMH includes:  Covid infection, COPD, CAD s/p 4 vessel CABG, DM, CHF, CKD4, EF 50%. Of note, Pt recieving HD via R femoral vein central line while in acute setting, removed 12/29.   OT comments  Pt seen for ADL retraining and to discuss set up at home with rec for a shower chair with back, how to ambulate safely with O2 tubing and rec he use a bucket and urinal set up at night time since his bathroom is on the other side of the house.  His son lives with him about 50% of the time and can help provide assist and supervision as needed.  Also rec he have a reacher at home to pick up items on the floor and demonstrated how to use for LB dressing skills when SOB is worse (currently on 2L of O2).  Given adpative equip catalog as a reference on where to purchase items and price.  Also reviewed energy conserv tech and pursed lip breathing.   Follow Up Recommendations  Home health OT;Supervision/Assistance - 24 hour    Equipment Recommendations  3 in 1 bedside commode    Recommendations for Other Services      Precautions / Restrictions Precautions Precautions: Fall Restrictions Weight Bearing Restrictions: No       Mobility Bed Mobility                  Transfers                      Balance                                           ADL either performed or assessed with clinical judgement   ADL Overall ADL's : Needs assistance/impaired                                       General ADL Comments: Pt seen to discuss set up at home with rec for a shower chair with back, how to ambulate safely with O2 tubing and rec he use a bucket and urinal set up at night time since his bathroom is on the other side of the house.  His son lives with him  about 50% of the time and can help provide assist and supervision as needed.  Also rec he have a reacher at home to pick up items on the floor and demonstrated how to use for LB dressing skills when SOB is worse (currently on 2L of O2).  Given adpative equip catalog as a reference on where to purchase items and price.  Also reviewed energy conserv tech and pursed lip breathing.     Vision Patient Visual Report: No change from baseline     Perception     Praxis      Cognition Arousal/Alertness: Awake/alert Behavior During Therapy: WFL for tasks assessed/performed Overall Cognitive Status: Within Functional Limits for tasks assessed  Exercises     Shoulder Instructions       General Comments      Pertinent Vitals/ Pain       Pain Assessment: No/denies pain  Home Living                                          Prior Functioning/Environment              Frequency  Min 1X/week        Progress Toward Goals  OT Goals(current goals can now be found in the care plan section)  Progress towards OT goals: Progressing toward goals  Acute Rehab OT Goals Patient Stated Goal: to get home and get stronger OT Goal Formulation: With patient Time For Goal Achievement: 07/09/19 Potential to Achieve Goals: Good  Plan Discharge plan remains appropriate    Co-evaluation                 AM-PAC OT "6 Clicks" Daily Activity     Outcome Measure   Help from another person eating meals?: None Help from another person taking care of personal grooming?: None Help from another person toileting, which includes using toliet, bedpan, or urinal?: A Little Help from another person bathing (including washing, rinsing, drying)?: A Little Help from another person to put on and taking off regular upper body clothing?: None Help from another person to put on and taking off regular lower body clothing?: None 6  Click Score: 22    End of Session Equipment Utilized During Treatment: Gait belt  OT Visit Diagnosis: Unsteadiness on feet (R26.81);Muscle weakness (generalized) (M62.81)   Activity Tolerance Patient tolerated treatment well   Patient Left with call bell/phone within reach   Nurse Communication Mobility status        Time: [id number] OT Time Calculation (min): 25 min  Charges: OT General Charges $OT Visit: 1 Visit OT Treatments $Self Care/Home Management : 23-37 mins  Chrys Racer, OTR/L, [place] as[employer] [phone] 06/28/19, 2:05 PM

## 2019-07-22 ENCOUNTER — Other Ambulatory Visit: Payer: Self-pay

## 2019-07-22 ENCOUNTER — Emergency Department: Payer: Medicare HMO

## 2019-07-22 ENCOUNTER — Inpatient Hospital Stay
Admission: EM | Admit: 2019-07-22 | Discharge: 2019-07-25 | DRG: 291 | Discharge status: Against Medical Advice | Payer: Medicare HMO | Attending: Family Medicine | Admitting: Family Medicine

## 2019-07-22 DIAGNOSIS — N184 Chronic kidney disease, stage 4 (severe): Secondary | ICD-10-CM | POA: Diagnosis present

## 2019-07-22 DIAGNOSIS — J441 Chronic obstructive pulmonary disease with (acute) exacerbation: Secondary | ICD-10-CM | POA: Diagnosis present

## 2019-07-22 DIAGNOSIS — L97929 Non-pressure chronic ulcer of unspecified part of left lower leg with unspecified severity: Secondary | ICD-10-CM | POA: Diagnosis present

## 2019-07-22 DIAGNOSIS — E1122 Type 2 diabetes mellitus with diabetic chronic kidney disease: Secondary | ICD-10-CM

## 2019-07-22 DIAGNOSIS — I129 Hypertensive chronic kidney disease with stage 1 through stage 4 chronic kidney disease, or unspecified chronic kidney disease: Secondary | ICD-10-CM | POA: Diagnosis not present

## 2019-07-22 DIAGNOSIS — Z833 Family history of diabetes mellitus: Secondary | ICD-10-CM

## 2019-07-22 DIAGNOSIS — H538 Other visual disturbances: Secondary | ICD-10-CM | POA: Diagnosis present

## 2019-07-22 DIAGNOSIS — J440 Chronic obstructive pulmonary disease with acute lower respiratory infection: Secondary | ICD-10-CM | POA: Diagnosis present

## 2019-07-22 DIAGNOSIS — I130 Hypertensive heart and chronic kidney disease with heart failure and stage 1 through stage 4 chronic kidney disease, or unspecified chronic kidney disease: Principal | ICD-10-CM | POA: Diagnosis present

## 2019-07-22 DIAGNOSIS — Z8349 Family history of other endocrine, nutritional and metabolic diseases: Secondary | ICD-10-CM

## 2019-07-22 DIAGNOSIS — I2510 Atherosclerotic heart disease of native coronary artery without angina pectoris: Secondary | ICD-10-CM | POA: Diagnosis present

## 2019-07-22 DIAGNOSIS — Z8249 Family history of ischemic heart disease and other diseases of the circulatory system: Secondary | ICD-10-CM | POA: Diagnosis not present

## 2019-07-22 DIAGNOSIS — E039 Hypothyroidism, unspecified: Secondary | ICD-10-CM | POA: Diagnosis present

## 2019-07-22 DIAGNOSIS — J189 Pneumonia, unspecified organism: Secondary | ICD-10-CM

## 2019-07-22 DIAGNOSIS — R0603 Acute respiratory distress: Secondary | ICD-10-CM | POA: Diagnosis not present

## 2019-07-22 DIAGNOSIS — I447 Left bundle-branch block, unspecified: Secondary | ICD-10-CM | POA: Diagnosis present

## 2019-07-22 DIAGNOSIS — Z7902 Long term (current) use of antithrombotics/antiplatelets: Secondary | ICD-10-CM

## 2019-07-22 DIAGNOSIS — I83029 Varicose veins of left lower extremity with ulcer of unspecified site: Secondary | ICD-10-CM | POA: Diagnosis present

## 2019-07-22 DIAGNOSIS — Z79891 Long term (current) use of opiate analgesic: Secondary | ICD-10-CM

## 2019-07-22 DIAGNOSIS — Z8701 Personal history of pneumonia (recurrent): Secondary | ICD-10-CM

## 2019-07-22 DIAGNOSIS — J181 Lobar pneumonia, unspecified organism: Secondary | ICD-10-CM | POA: Diagnosis present

## 2019-07-22 DIAGNOSIS — Z66 Do not resuscitate: Secondary | ICD-10-CM | POA: Diagnosis present

## 2019-07-22 DIAGNOSIS — Z951 Presence of aortocoronary bypass graft: Secondary | ICD-10-CM

## 2019-07-22 DIAGNOSIS — I252 Old myocardial infarction: Secondary | ICD-10-CM | POA: Diagnosis not present

## 2019-07-22 DIAGNOSIS — J9621 Acute and chronic respiratory failure with hypoxia: Secondary | ICD-10-CM | POA: Diagnosis present

## 2019-07-22 DIAGNOSIS — Z5329 Procedure and treatment not carried out because of patient's decision for other reasons: Secondary | ICD-10-CM | POA: Diagnosis present

## 2019-07-22 DIAGNOSIS — I5043 Acute on chronic combined systolic (congestive) and diastolic (congestive) heart failure: Secondary | ICD-10-CM | POA: Diagnosis present

## 2019-07-22 DIAGNOSIS — Z79890 Hormone replacement therapy (postmenopausal): Secondary | ICD-10-CM

## 2019-07-22 DIAGNOSIS — I5023 Acute on chronic systolic (congestive) heart failure: Secondary | ICD-10-CM

## 2019-07-22 DIAGNOSIS — I5033 Acute on chronic diastolic (congestive) heart failure: Secondary | ICD-10-CM | POA: Diagnosis not present

## 2019-07-22 DIAGNOSIS — Z9981 Dependence on supplemental oxygen: Secondary | ICD-10-CM

## 2019-07-22 DIAGNOSIS — J9601 Acute respiratory failure with hypoxia: Secondary | ICD-10-CM

## 2019-07-22 DIAGNOSIS — D631 Anemia in chronic kidney disease: Secondary | ICD-10-CM | POA: Diagnosis present

## 2019-07-22 DIAGNOSIS — Z794 Long term (current) use of insulin: Secondary | ICD-10-CM

## 2019-07-22 DIAGNOSIS — Z8616 Personal history of COVID-19: Secondary | ICD-10-CM

## 2019-07-22 DIAGNOSIS — Z79899 Other long term (current) drug therapy: Secondary | ICD-10-CM

## 2019-07-22 DIAGNOSIS — Z87891 Personal history of nicotine dependence: Secondary | ICD-10-CM

## 2019-07-22 DIAGNOSIS — Z7982 Long term (current) use of aspirin: Secondary | ICD-10-CM

## 2019-07-22 DIAGNOSIS — I872 Venous insufficiency (chronic) (peripheral): Secondary | ICD-10-CM | POA: Diagnosis present

## 2019-07-22 LAB — CBC
HCT: 31.7 % — ABNORMAL LOW (ref 39.0–52.0)
Hemoglobin: 9.7 g/dL — ABNORMAL LOW (ref 13.0–17.0)
MCH: 27.9 pg (ref 26.0–34.0)
MCHC: 30.6 g/dL (ref 30.0–36.0)
MCV: 91.1 fL (ref 80.0–100.0)
Platelets: 394 10*3/uL (ref 150–400)
RBC: 3.48 MIL/uL — ABNORMAL LOW (ref 4.22–5.81)
RDW: 14.6 % (ref 11.5–15.5)
WBC: 9.1 10*3/uL (ref 4.0–10.5)
nRBC: 0 % (ref 0.0–0.2)

## 2019-07-22 LAB — TROPONIN I (HIGH SENSITIVITY)
Troponin I (High Sensitivity): 23 ng/L — ABNORMAL HIGH (ref <18)
Troponin I (High Sensitivity): 24 ng/L — ABNORMAL HIGH (ref <18)

## 2019-07-22 LAB — BRAIN NATRIURETIC PEPTIDE: B Natriuretic Peptide: 590 pg/mL — ABNORMAL HIGH (ref 0.0–100.0)

## 2019-07-22 LAB — COMPREHENSIVE METABOLIC PANEL
ALT: 12 U/L (ref 0–44)
AST: 18 U/L (ref 15–41)
Albumin: 2.7 g/dL — ABNORMAL LOW (ref 3.5–5.0)
Alkaline Phosphatase: 150 U/L — ABNORMAL HIGH (ref 38–126)
Anion gap: 10 (ref 5–15)
BUN: 35 mg/dL — ABNORMAL HIGH (ref 8–23)
CO2: 22 mmol/L (ref 22–32)
Calcium: 8.9 mg/dL (ref 8.9–10.3)
Chloride: 108 mmol/L (ref 98–111)
Creatinine, Ser: 3.02 mg/dL — ABNORMAL HIGH (ref 0.61–1.24)
GFR calc Af Amer: 24 mL/min — ABNORMAL LOW (ref >60)
GFR calc non Af Amer: 21 mL/min — ABNORMAL LOW (ref >60)
Glucose, Bld: 113 mg/dL — ABNORMAL HIGH (ref 70–99)
Potassium: 4.2 mmol/L (ref 3.5–5.1)
Sodium: 140 mmol/L (ref 135–145)
Total Bilirubin: 0.7 mg/dL (ref 0.3–1.2)
Total Protein: 6.7 g/dL (ref 6.5–8.1)

## 2019-07-22 LAB — GLUCOSE, CAPILLARY: Glucose-Capillary: 140 mg/dL — ABNORMAL HIGH (ref 70–99)

## 2019-07-22 MED ORDER — HEPARIN (PORCINE) 25000 UT/250ML-% IV SOLN
1300.0000 [IU]/h | INTRAVENOUS | Status: DC
  Administered 2019-07-22: 1500 [IU]/h via INTRAVENOUS
  Filled 2019-07-22: qty 250

## 2019-07-22 MED ORDER — SODIUM CHLORIDE 0.9 % IV SOLN
500.0000 mg | INTRAVENOUS | Status: DC
  Administered 2019-07-23 (×2): 500 mg via INTRAVENOUS
  Filled 2019-07-22 (×3): qty 500

## 2019-07-22 MED ORDER — CARVEDILOL 25 MG PO TABS: 25.0000 mg | ORAL_TABLET | Freq: Two times a day (BID) | ORAL | Status: DC

## 2019-07-22 MED ORDER — SODIUM CHLORIDE 0.9% FLUSH
3.0000 mL | Freq: Two times a day (BID) | INTRAVENOUS | Status: DC
  Administered 2019-07-23 – 2019-07-25 (×5): 3 mL via INTRAVENOUS

## 2019-07-22 MED ORDER — ACETAMINOPHEN 325 MG PO TABS: 650.0000 mg | ORAL_TABLET | ORAL | Status: DC | PRN

## 2019-07-22 MED ORDER — FUROSEMIDE 10 MG/ML IJ SOLN
60.0000 mg | Freq: Two times a day (BID) | INTRAMUSCULAR | Status: DC
  Administered 2019-07-23 – 2019-07-24 (×3): 60 mg via INTRAVENOUS
  Filled 2019-07-22 (×3): qty 6

## 2019-07-22 MED ORDER — ONDANSETRON HCL 4 MG/2ML IJ SOLN: 4.0000 mg | Freq: Four times a day (QID) | INTRAMUSCULAR | Status: DC | PRN

## 2019-07-22 MED ORDER — HEPARIN BOLUS VIA INFUSION
6000.0000 [IU] | Freq: Once | INTRAVENOUS | Status: AC
  Administered 2019-07-22: 6000 [IU] via INTRAVENOUS
  Filled 2019-07-22: qty 6000

## 2019-07-22 MED ORDER — SODIUM CHLORIDE 0.9 % IV SOLN
2.0000 g | INTRAVENOUS | Status: DC
  Administered 2019-07-23 – 2019-07-24 (×3): 2 g via INTRAVENOUS
  Filled 2019-07-22 (×2): qty 20
  Filled 2019-07-22: qty 2
  Filled 2019-07-22: qty 20

## 2019-07-22 MED ORDER — INSULIN ASPART 100 UNIT/ML ~~LOC~~ SOLN
0.0000 [IU] | Freq: Three times a day (TID) | SUBCUTANEOUS | Status: DC
  Administered 2019-07-23 (×3): 5 [IU] via SUBCUTANEOUS
  Administered 2019-07-24: 17:00:00 15 [IU] via SUBCUTANEOUS
  Administered 2019-07-24: 13:00:00 11 [IU] via SUBCUTANEOUS
  Administered 2019-07-25 (×2): 15 [IU] via SUBCUTANEOUS
  Filled 2019-07-22 (×7): qty 1

## 2019-07-22 MED ORDER — ENOXAPARIN SODIUM 40 MG/0.4ML ~~LOC~~ SOLN: 30.0000 mg | SUBCUTANEOUS | Status: DC

## 2019-07-22 MED ORDER — FUROSEMIDE 10 MG/ML IJ SOLN
80.0000 mg | Freq: Once | INTRAMUSCULAR | Status: AC
  Administered 2019-07-22: 20:00:00 80 mg via INTRAVENOUS
  Filled 2019-07-22: qty 8

## 2019-07-22 MED ORDER — SODIUM CHLORIDE 0.9 % IV SOLN: 250.0000 mL | INTRAVENOUS | Status: DC | PRN

## 2019-07-22 MED ORDER — ASPIRIN EC 81 MG PO TBEC: 81.0000 mg | DELAYED_RELEASE_TABLET | Freq: Every day | ORAL | Status: DC

## 2019-07-22 MED ORDER — LEVOFLOXACIN IN D5W 750 MG/150ML IV SOLN
750.0000 mg | Freq: Once | INTRAVENOUS | Status: AC
  Administered 2019-07-22: 21:00:00 750 mg via INTRAVENOUS
  Filled 2019-07-22: qty 150

## 2019-07-22 MED ORDER — SODIUM CHLORIDE 0.9% FLUSH: 3.0000 mL | INTRAVENOUS | Status: DC | PRN

## 2019-07-22 MED ORDER — INSULIN ASPART 100 UNIT/ML ~~LOC~~ SOLN
0.0000 [IU] | Freq: Every day | SUBCUTANEOUS | Status: DC
  Administered 2019-07-23 – 2019-07-24 (×2): 5 [IU] via SUBCUTANEOUS
  Filled 2019-07-22 (×2): qty 1

## 2019-07-22 NOTE — ED Notes (Signed)
Admitting MD at bedside.

## 2019-07-22 NOTE — ED Notes (Signed)
MD Williams at bedside.  

## 2019-07-22 NOTE — H&P (Signed)
History and Physical    [name] [id number] DOB: [birth year] DOA: 07/22/2019  PCP: [facility], [place]   Patient coming from: home I have personally briefly reviewed patient's old medical records in [place]  Chief Complaint: respiratory distress  HPI: Bryce Garcia is a 69 y.o. male with medical history significant for diastolic heart failure, CAD with history of MI, CKD 4, type 2 diabetes on insulin, chronic anemia, history of COVID-19 pneumonia in November 2020, on 'as needed' O2 at 3L/min at home since 06/21/2019 when he was hospitalized for acute respiratory distress related to pneumonia and heart failure exacerbation, who was brought in by EMS in respiratory distress requiring CPAP. ED Course: On arrival in the emergency room he was transition from CPAP to BiPAP.  O2 sat was 100% on BiPAP with respirations 19.  He was afebrile at 98.3 and blood pressure was 168/70 with heart rate of 83.  Venous blood gas on BiPAP was 7.45 with a PCO2 of 40.  BNP was elevated at 590.  Troponin was 24 and remained flat with second set.  EKG showed normal sinus rhythm with no acute ST-T wave changes..  Chest x-ray showed patchy bilateral airspace disease, right greater than left most compatible with pneumonia.  Patient had rales on exam and was treated with IV Lasix and also given IV Levaquin.  Patient improved with BiPAP and Lasix and was transitioned to O2 via nasal cannula at 5 L by the time of admission. At the time of my evaluation, patient was sitting upright on the stretcher and appeared comfortable on high flow O2 via nasal cannula.  He stated that he was in his usual state of health prior to the sudden onset of the shortness of breath.  He states that he has been compliant with his home medication.  He denied chest pain, nausea vomiting or diaphoresis.  Denied cough fever or chills.  Stated that he had been doing well since his discharge from the hospital  on 06/21/2019.  Review of Systems: As per HPI otherwise 10 point review of systems negative.    Past Medical History:  Diagnosis Date  . CAD (coronary artery disease)   . CKD (chronic kidney disease), stage III   . COPD (chronic obstructive pulmonary disease) ([place])   . COVID-19   . Diabetes mellitus without complication ([place])   . HFrEF (heart failure with reduced ejection fraction) ([place])   . LBBB (left bundle branch block)   . Venous ulcer ([place])     History reviewed. No pertinent surgical history.   reports that he has quit smoking. He has never used smokeless tobacco. He reports previous alcohol use. He reports that he does not use drugs.  Allergies  Allergen Reactions  . Other Other (See Comments)    Hypoglycemia unawareness Hypoglycemia unawareness     Family History  Problem Relation Age of Onset  . Diabetes Mother   . Hypertension Mother   . Hyperlipidemia Mother   . Cancer Father      Prior to Admission medications   Medication Sig Start Date End Date Taking? Authorizing Provider  amLODipine (NORVASC) 10 MG tablet Take 10 mg by mouth daily. 04/09/19   [provider]  aspirin 81 MG EC tablet Take by mouth. 12/08/06   [provider]  atorvastatin (LIPITOR) 80 MG tablet Take 80 mg by mouth daily. 04/30/19   [provider]  carvedilol (COREG) 25 MG tablet Take 25 mg  by mouth 2 (two) times daily. 04/05/19   [provider]  clopidogrel (PLAVIX) 75 MG tablet Take 75 mg by mouth daily. 04/30/19   [provider]  DULoxetine (CYMBALTA) 30 MG capsule Take 30 mg by mouth daily. 05/22/19   [provider]  EUTHYROX 100 MCG tablet Take 100 mcg by mouth daily. 03/18/19   [provider]  insulin NPH Human (NOVOLIN N) 100 UNIT/ML injection Inject 5 units sq qam and 19 units sq q hs, adjust as directed up to 30 units daily 04/17/18   [provider]  insulin regular (NOVOLIN R) 100 units/mL injection Inject  5-20 Units into the skin 3 (three) times daily before meals. Depending on blood glucose level 04/17/18   [provider]  pentoxifylline (TRENTAL) 400 MG CR tablet Take 400 mg by mouth 3 (three) times daily. 06/12/19   [provider]  torsemide (DEMADEX) 20 MG tablet Take 2 tablets (40 mg total) by mouth daily. 06/29/19   Samuella Cota, MD  traMADol (ULTRAM) 50 MG tablet Take 50 mg by mouth every 6 (six) hours as needed. 05/17/19   [provider]    Physical Exam: Vitals:   07/22/19 1946 07/22/19 2000 07/22/19 2030 07/22/19 2100  BP: (!) 169/70 (!) 150/68 (!) 156/71 (!) 149/72  Pulse: 83 68 68 64  Resp: 19 17 17 17   Temp:      TempSrc:      SpO2: 95% 94% 90% 94%  Weight:         Vitals:   07/22/19 1946 07/22/19 2000 07/22/19 2030 07/22/19 2100  BP: (!) 169/70 (!) 150/68 (!) 156/71 (!) 149/72  Pulse: 83 68 68 64  Resp: 19 17 17 17   Temp:      TempSrc:      SpO2: 95% 94% 90% 94%  Weight:        Constitutional: NAD, alert and oriented x 3 Eyes: PERRL, lids and conjunctivae normal ENMT: Mucous membranes are moist.  Neck: normal, supple, no masses, no thyromegaly Respiratory: diminished bilateral bases, no wheezing, no crackles. Normal respiratory effort. No accessory muscle use.  Cardiovascular: Regular rate and rhythm, no murmurs / rubs / gallops. 2+ extremity edema. 2+ pedal pulses. No carotid bruits.  Abdomen: no tenderness, no masses palpated. No hepatosplenomegaly. Bowel sounds positive.  Musculoskeletal: no clubbing / cyanosis. No joint deformity upper and lower extremities.  Skin:venous stasis dermatitis bilateral.left ankle in bandage, has venous stasis ulcer Neurologic: No gross focal neurologic deficit. Psychiatric: Normal mood and affect.   Labs on Admission: I have personally reviewed following labs and imaging studies  CBC: Recent Labs  Lab 07/22/19 1927  WBC 9.1  HGB 9.7*  HCT 31.7*  MCV 91.1  PLT [id number]   Basic Metabolic  Panel: Recent Labs  Lab 07/22/19 1927  NA 140  K 4.2  CL 108  CO2 22  GLUCOSE 113*  BUN 35*  CREATININE 3.02*  CALCIUM 8.9   GFR: Estimated Creatinine Clearance: 26.1 mL/min (A) (by C-G formula based on SCr of 3.02 mg/dL (H)). Liver Function Tests: Recent Labs  Lab 07/22/19 1927  AST 18  ALT 12  ALKPHOS 150*  BILITOT 0.7  PROT 6.7  ALBUMIN 2.7*   No results for input(s): LIPASE, AMYLASE in the last 168 hours. No results for input(s): AMMONIA in the last 168 hours. Coagulation Profile: No results for input(s): INR, PROTIME in the last 168 hours. Cardiac Enzymes: No results for input(s): CKTOTAL, CKMB, CKMBINDEX, TROPONINI in  the last 168 hours. BNP (last 3 results) No results for input(s): PROBNP in the last 8760 hours. HbA1C: No results for input(s): HGBA1C in the last 72 hours. CBG: No results for input(s): GLUCAP in the last 168 hours. Lipid Profile: No results for input(s): CHOL, HDL, LDLCALC, TRIG, CHOLHDL, LDLDIRECT in the last 72 hours. Thyroid Function Tests: No results for input(s): TSH, T4TOTAL, FREET4, T3FREE, THYROIDAB in the last 72 hours. Anemia Panel: No results for input(s): VITAMINB12, FOLATE, FERRITIN, TIBC, IRON, RETICCTPCT in the last 72 hours. Urine analysis:    Component Value Date/Time   COLORURINE Yellow 01/07/2014 1900   APPEARANCEUR Clear 01/07/2014 1900   LABSPEC 1.015 01/07/2014 1900   PHURINE 6.0 01/07/2014 1900   GLUCOSEU >=500 01/07/2014 1900   HGBUR Negative 01/07/2014 1900   BILIRUBINUR Negative 01/07/2014 1900   KETONESUR Negative 01/07/2014 1900   PROTEINUR >=500 01/07/2014 1900   NITRITE Negative 01/07/2014 1900   LEUKOCYTESUR Negative 01/07/2014 1900    Radiological Exams on Admission: DG Chest Portable 1 View  Result Date: 07/22/2019 CLINICAL DATA:  Respiratory distress, shortness of breath EXAM: PORTABLE CHEST 1 VIEW COMPARISON:  06/21/2019 FINDINGS: Patchy airspace disease in the lungs bilaterally, right greater  than left compatible with pneumonia. Heart is mildly enlarged. Prior CABG. No effusions or acute bony abnormality. IMPRESSION: Patchy bilateral airspace disease, right greater than left most compatible with pneumonia. Cardiomegaly. Electronically Signed   By: Rolm Baptise M.D.   On: 07/22/2019 19:50    EKG: Independently reviewed.   Assessment/Plan Principal Problem:   Acute respiratory failure with hypoxia (HCC) Etiology believe more likely related to heart failure exacerbation, based on chest x-ray might be related to pneumonia as well -At baseline uses O2'as needed' starting since he was hospitalized in December Initially required BiPAP but now weaned to O2 via nasal cannula at 5 L Continue supplemental oxygen to maintain sats above 92 Treat each etiology as outlined below    Acute on chronic heart failure with preserved ejection fraction (HFpEF) (HCC) Last echocardiogram on 06/21/2019 showed grade 2 diastolic dysfunction, EF 50 to 55% Continue IV Lasix, home carvedilol 25 twice daily.  No ACE inhibitor in view of renal function Salt and fluid restriction and daily weights Continue to cycle troponins to evaluate for ACS   Recurrent pneumonia, possible HCAP History of COVID-19 in November 2020 Patient had COVID-19 pneumonia in November 2020, was hospitalized for non-Covid pneumonia in December 2020 and the chest x-ray now shows pneumonia WBC was normal Follow procalcitonin to evaluate for bacterial etiology IV Rocephin and azithromycin. Hold off on vancomycin pending MRSA, especially in view of poor renal function Supplemental oxygen keep sats over 92    CKD (chronic kidney disease) stage 4, GFR 15-29 ml/min (HCC) Currently at baseline Monitor for worsening in view of diuretic therapy Patient required temporary dialysis during his hospitalization in December 2020    Hx of CABG No complaints of chest pain and troponin 24 with flat trend Continue to cycle New home antiplatelets,  beta-blockers, statins    Anemia of chronic kidney failure, stage 4 (severe) (HCC) Hemoglobin 9.7 which is above baseline of 8.8    Type 2 diabetes mellitus with stage 4 chronic kidney disease ([place]) Hold home insulin regimen Supplemental coverage for now  Chronic left lower extremity venous stasis ulcer Follows up with wound care [facility] Continue wound care         DVT prophylaxis: lovenox  Code Status: Elects to be DNR  Family Communication: none  Disposition Plan: Back to previous home environment Consults called: none     Athena Masse MD [facility]     07/22/2019, 10:58 PM

## 2019-07-22 NOTE — ED Triage Notes (Signed)
Patient coming in [facility] from home for respiratory distress on CPAP at arrival. Patient dx with COVID in [place], has since been symptoms free and tested negative. Patient wears 3L [place] O2 at home prn. Patient received originally had expiratory wheezes at EMS arrival, with oxygen saturation of 96% on 3L [place]. Patient received 1 duoneb then respiratory status worsened. Patient's respirations became labored, with respirations of over 30/minute and accessory muscle usage. Patient placed on CPAP.

## 2019-07-22 NOTE — ED Notes (Signed)
RT at bedside. Patient taken off of Bi-pap by RT and placed on 6L [place] bubble. Patient tolerating well. Oxygen saturation level currently 97%. Respirations even and unlabored. RN will continue to monitor.

## 2019-07-22 NOTE — Progress Notes (Signed)
ANTICOAGULATION CONSULT NOTE - Initial Consult  Pharmacy Consult for Heparin Indication: pulmonary embolus  Allergies  Allergen Reactions  . Other Other (See Comments)    Hypoglycemia unawareness Hypoglycemia unawareness     Patient Measurements: Weight: 197 lb 5 oz (89.5 kg)  Vital Signs: Temp: 98.3 F (36.8 C) (01/24 1915) Temp Source: Axillary (01/24 1915) BP: 149/72 (01/24 2100) Pulse Rate: 64 (01/24 2100)  Labs: Recent Labs    07/22/19 1927 07/22/19 2118  HGB 9.7*  --   HCT 31.7*  --   PLT 394  --   CREATININE 3.02*  --   TROPONINIHS 23* 24*    Estimated Creatinine Clearance: 26.1 mL/min (A) (by C-G formula based on SCr of 3.02 mg/dL (H)).   Medical History: Past Medical History:  Diagnosis Date  . CAD (coronary artery disease)   . CKD (chronic kidney disease), stage III   . COPD (chronic obstructive pulmonary disease) ([place])   . COVID-19   . Diabetes mellitus without complication ([place])   . HFrEF (heart failure with reduced ejection fraction) ([place])   . LBBB (left bundle branch block)   . Venous ulcer ([place])     Medications:  (Not in a hospital admission)   Assessment: No anticoagulants listed on PTA med list.  Baseline labs ordered.  Pharmacy asked to initiate Heparin for PE.   Goal of Therapy:  Heparin level 0.3-0.7 units/ml Monitor platelets by anticoagulation protocol: Yes   Plan:  Heparin 6000 units bolus x 1 then infusion at 1500 units/hr, check HL in 8 hours Monitor for s/sx bleeding complications  Hart Robinsons A 07/22/2019,11:10 PM

## 2019-07-22 NOTE — ED Provider Notes (Signed)
[facility] Emergency Department Provider Note       Time seen: ----------------------------------------- 7:18 PM on 07/22/2019 -----------------------------------------   I have reviewed the triage vital signs and the nursing notes.  HISTORY   Chief Complaint Respiratory Distress    HPI Bryce Garcia is a 69 y.o. male with a history of coronary artery disease, CKD, COPD, diabetes, low bundle branch block who presents to the ED for respiratory distress.  Patient describes a sudden onset of shortness of breath today and was placed on CPAP by EMS.  Patient denies any other recent sickness or complaints.  Patient states he has been taking his diuretics as prescribed.  Past Medical History:  Diagnosis Date  . CAD (coronary artery disease)   . CKD (chronic kidney disease), stage III   . COPD (chronic obstructive pulmonary disease) ([place])   . COVID-19   . Diabetes mellitus without complication ([place])   . HFrEF (heart failure with reduced ejection fraction) ([place])   . LBBB (left bundle branch block)   . Venous ulcer [facility])     Patient Active Problem List   Diagnosis Date Noted  . Acute on chronic heart failure with preserved ejection fraction (HFpEF) ([place])   . Acute renal failure with acute renal cortical necrosis superimposed on stage 3 chronic kidney disease ([place])   . CAP (community acquired pneumonia) 06/21/2019  . Acute respiratory failure with hypoxia ([place]) 06/21/2019  . Venous ulcer of left leg ([place]) 06/21/2019  . COPD with chronic bronchitis ([place]) 06/21/2019  . CKD (chronic kidney disease) stage 4, GFR 15-29 ml/min (HCC) 06/21/2019  . AKI (acute kidney injury) ([place]) 06/21/2019  . Chronic systolic CHF (congestive heart failure) ([place]) 06/21/2019  . History of 2019 novel coronavirus disease (COVID-19) 06/21/2019  . Anemia 06/21/2019  . Acute respiratory failure ([place]) 06/21/2019  . Hyperglycemia due to type 1 diabetes mellitus ([place]) 06/21/2019  . Hx of  CABG 06/21/2019  . Non-ST elevation (NSTEMI) myocardial infarction ([place]) 06/21/2019    No past surgical history on file.  Allergies Other  Social History Social History   Tobacco Use  . Smoking status: Never Smoker  . Smokeless tobacco: Never Used  Substance Use Topics  . Alcohol use: Not Currently  . Drug use: Never    Review of Systems Constitutional: Negative for fever. Cardiovascular: Negative for chest pain. Respiratory: Positive for shortness of breath Gastrointestinal: Negative for abdominal pain, vomiting and diarrhea. Musculoskeletal: Negative for back pain. Skin: Negative for rash. Neurological: Negative for headaches, focal weakness or numbness.  All systems negative/normal/unremarkable except as stated in the HPI  ____________________________________________   PHYSICAL EXAM:  VITAL SIGNS: ED Triage Vitals [07/22/19 1915]  Enc Vitals Group     BP      Pulse      Resp (!) 29     Temp 98.3 F (36.8 C)     Temp Source Axillary     SpO2      Weight 197 lb 5 oz (89.5 kg)     Height      Head Circumference      Peak Flow      Pain Score 0     Pain Loc      Pain Edu?      Excl. in [place]?    Constitutional: Alert and oriented.  Mild distress Eyes: Conjunctivae are normal. Normal extraocular movements. ENT      Head: Normocephalic and atraumatic.      Nose: No congestion/rhinnorhea.  Mouth/Throat: Mucous membranes are moist.      Neck: No stridor. Cardiovascular: Normal rate, regular rhythm. No murmurs, rubs, or gallops. Respiratory: Mild tachypnea with rales bilaterally Gastrointestinal: Soft and nontender. Normal bowel sounds Musculoskeletal: Nontender with normal range of motion in extremities.  Pitting edema bilaterally Neurologic:  Normal speech and language. No gross focal neurologic deficits are appreciated.  Skin:  Skin is warm, dry and intact. No rash noted. Psychiatric: Mood and affect are normal. Speech and behavior are normal.   ____________________________________________  EKG: Interpreted by me.  Sinus rhythm with rate of 71 bpm, left bundle branch block, short PR interval, borderline long QT  ____________________________________________  ED COURSE:  As part of my medical decision making, I reviewed the following data within the [place] History obtained from family if available, nursing notes, old chart and ekg, as well as notes from prior ED visits. Patient presented for respiratory distress, we will assess with labs and imaging as indicated at this time.   Procedures  Bryce Garcia was evaluated in Emergency Department on 07/22/2019 for the symptoms described in the history of present illness. He was evaluated in the context of the global COVID-19 pandemic, which necessitated consideration that the patient might be at risk for infection with the SARS-CoV-2 virus that causes COVID-19. Institutional protocols and algorithms that pertain to the evaluation of patients at risk for COVID-19 are in a state of rapid change based on information released by regulatory bodies including the CDC and federal and state organizations. These policies and algorithms were followed during the patient's care in the ED.  ____________________________________________   LABS (pertinent positives/negatives)  Labs Reviewed  CBC - Abnormal; Notable for the following components:      Result Value   RBC 3.48 (*)    Hemoglobin 9.7 (*)    HCT 31.7 (*)    All other components within normal limits  COMPREHENSIVE METABOLIC PANEL - Abnormal; Notable for the following components:   Glucose, Bld 113 (*)    BUN 35 (*)    Creatinine, Ser 3.02 (*)    Albumin 2.7 (*)    Alkaline Phosphatase 150 (*)    GFR calc non Af Amer 21 (*)    GFR calc Af Amer 24 (*)    All other components within normal limits  BRAIN NATRIURETIC PEPTIDE - Abnormal; Notable for the following components:   B Natriuretic Peptide 590.0 (*)    All other  components within normal limits  TROPONIN I (HIGH SENSITIVITY) - Abnormal; Notable for the following components:   Troponin I (High Sensitivity) 23 (*)    All other components within normal limits  CULTURE, BLOOD (ROUTINE X 2)  CULTURE, BLOOD (ROUTINE X 2)  BLOOD GAS, VENOUS  LACTIC ACID, PLASMA  TROPONIN I (HIGH SENSITIVITY)   CRITICAL CARE Performed by: Laurence Aly   Total critical care time: 30 minutes  Critical care time was exclusive of separately billable procedures and treating other patients.  Critical care was necessary to treat or prevent imminent or life-threatening deterioration.  Critical care was time spent personally by me on the following activities: development of treatment plan with patient and/or surrogate as well as nursing, discussions with consultants, evaluation of patient's response to treatment, examination of patient, obtaining history from patient or surrogate, ordering and performing treatments and interventions, ordering and review of laboratory studies, ordering and review of radiographic studies, pulse oximetry and re-evaluation of patient's condition.  RADIOLOGY Images were viewed by me  Chest x-ray IMPRESSION: Patchy bilateral airspace disease, right greater than left most compatible with pneumonia.  Cardiomegaly. ____________________________________________   DIFFERENTIAL DIAGNOSIS   CHF, hypertensive emergency, COVID-19, pneumonia, PE, pneumothorax  FINAL ASSESSMENT AND PLAN  Community-acquired pneumonia, CHF exacerbation   Plan: The patient had presented for acute respiratory distress.  Patient was immediately placed on BiPAP on arrival and given IV Lasix.  Patient's labs did not reveal any acute abnormalities, he has chronic kidney disease, chronic CHF and chronic elevations in his troponin. Patient's imaging revealed bilateral airspace disease compatible with pneumonia.  He did have Covid 2 months ago which makes his diagnosis  challenging.  As dictated previously he was given IV Lasix, I have ordered IV Levaquin for him.  I will discuss with the hospitalist for admission.   Laurence Aly, MD    Note: This note was generated in part or whole with voice recognition software. Voice recognition is usually quite accurate but there are transcription errors that can and very often do occur. I apologize for any typographical errors that were not detected and corrected.     Earleen Newport, MD 07/22/19 [phone]

## 2019-07-22 NOTE — ED Provider Notes (Signed)
Patient appears to have been transitioned off of BiPAP and is tolerating it well.   Bryce Newport, MD 07/22/19 2157

## 2019-07-23 DIAGNOSIS — E1122 Type 2 diabetes mellitus with diabetic chronic kidney disease: Secondary | ICD-10-CM

## 2019-07-23 DIAGNOSIS — J181 Lobar pneumonia, unspecified organism: Secondary | ICD-10-CM

## 2019-07-23 DIAGNOSIS — I129 Hypertensive chronic kidney disease with stage 1 through stage 4 chronic kidney disease, or unspecified chronic kidney disease: Secondary | ICD-10-CM

## 2019-07-23 DIAGNOSIS — N184 Chronic kidney disease, stage 4 (severe): Secondary | ICD-10-CM

## 2019-07-23 DIAGNOSIS — I5033 Acute on chronic diastolic (congestive) heart failure: Secondary | ICD-10-CM

## 2019-07-23 DIAGNOSIS — H538 Other visual disturbances: Secondary | ICD-10-CM

## 2019-07-23 DIAGNOSIS — J441 Chronic obstructive pulmonary disease with (acute) exacerbation: Secondary | ICD-10-CM

## 2019-07-23 LAB — CBC
HCT: 29.3 % — ABNORMAL LOW (ref 39.0–52.0)
Hemoglobin: 9 g/dL — ABNORMAL LOW (ref 13.0–17.0)
MCH: 28.1 pg (ref 26.0–34.0)
MCHC: 30.7 g/dL (ref 30.0–36.0)
MCV: 91.6 fL (ref 80.0–100.0)
Platelets: 349 10*3/uL (ref 150–400)
RBC: 3.2 MIL/uL — ABNORMAL LOW (ref 4.22–5.81)
RDW: 14.6 % (ref 11.5–15.5)
WBC: 7.5 10*3/uL (ref 4.0–10.5)
nRBC: 0 % (ref 0.0–0.2)

## 2019-07-23 LAB — BASIC METABOLIC PANEL
Anion gap: 12 (ref 5–15)
BUN: 32 mg/dL — ABNORMAL HIGH (ref 8–23)
CO2: 21 mmol/L — ABNORMAL LOW (ref 22–32)
Calcium: 8.6 mg/dL — ABNORMAL LOW (ref 8.9–10.3)
Chloride: 107 mmol/L (ref 98–111)
Creatinine, Ser: 3 mg/dL — ABNORMAL HIGH (ref 0.61–1.24)
GFR calc Af Amer: 24 mL/min — ABNORMAL LOW (ref >60)
GFR calc non Af Amer: 21 mL/min — ABNORMAL LOW (ref >60)
Glucose, Bld: 191 mg/dL — ABNORMAL HIGH (ref 70–99)
Potassium: 4.2 mmol/L (ref 3.5–5.1)
Sodium: 140 mmol/L (ref 135–145)

## 2019-07-23 LAB — PROTIME-INR
INR: 1 (ref 0.8–1.2)
Prothrombin Time: 13.3 seconds (ref 11.4–15.2)

## 2019-07-23 LAB — GLUCOSE, CAPILLARY
Glucose-Capillary: 230 mg/dL — ABNORMAL HIGH (ref 70–99)
Glucose-Capillary: 203 mg/dL — ABNORMAL HIGH (ref 70–99)
Glucose-Capillary: 246 mg/dL — ABNORMAL HIGH (ref 70–99)
Glucose-Capillary: 410 mg/dL — ABNORMAL HIGH (ref 70–99)

## 2019-07-23 LAB — HIV ANTIBODY (ROUTINE TESTING W REFLEX): HIV Screen 4th Generation wRfx: NONREACTIVE

## 2019-07-23 LAB — LACTIC ACID, PLASMA: Lactic Acid, Venous: 0.7 mmol/L (ref 0.5–1.9)

## 2019-07-23 LAB — PROCALCITONIN
Procalcitonin: <0.1 ng/mL
Procalcitonin: <0.1 ng/mL

## 2019-07-23 LAB — APTT: aPTT: 36 seconds (ref 24–36)

## 2019-07-23 LAB — HEPARIN LEVEL (UNFRACTIONATED): Heparin Unfractionated: 0.83 IU/mL — ABNORMAL HIGH (ref 0.30–0.70)

## 2019-07-23 LAB — BRAIN NATRIURETIC PEPTIDE: B Natriuretic Peptide: 555 pg/mL — ABNORMAL HIGH (ref 0.0–100.0)

## 2019-07-23 MED ORDER — ALBUTEROL SULFATE HFA 108 (90 BASE) MCG/ACT IN AERS
2.0000 | INHALATION_SPRAY | RESPIRATORY_TRACT | Status: DC | PRN
  Filled 2019-07-23: qty 6.7

## 2019-07-23 MED ORDER — LEVOTHYROXINE SODIUM 100 MCG PO TABS
100.0000 ug | ORAL_TABLET | Freq: Every day | ORAL | Status: DC
  Administered 2019-07-23 – 2019-07-25 (×3): 100 ug via ORAL
  Filled 2019-07-23 (×3): qty 1

## 2019-07-23 MED ORDER — AMLODIPINE BESYLATE 10 MG PO TABS
10.0000 mg | ORAL_TABLET | Freq: Every day | ORAL | Status: DC
  Administered 2019-07-23 – 2019-07-25 (×3): 10 mg via ORAL
  Filled 2019-07-23 (×3): qty 1

## 2019-07-23 MED ORDER — TRAZODONE HCL 50 MG PO TABS
50.0000 mg | ORAL_TABLET | Freq: Every evening | ORAL | Status: DC | PRN
  Administered 2019-07-23: 21:00:00 50 mg via ORAL
  Filled 2019-07-23: qty 1

## 2019-07-23 MED ORDER — IPRATROPIUM-ALBUTEROL 0.5-2.5 (3) MG/3ML IN SOLN
3.0000 mL | Freq: Four times a day (QID) | RESPIRATORY_TRACT | Status: DC
  Administered 2019-07-23 – 2019-07-25 (×8): 3 mL via RESPIRATORY_TRACT
  Filled 2019-07-23 (×9): qty 3

## 2019-07-23 MED ORDER — INSULIN DETEMIR 100 UNIT/ML ~~LOC~~ SOLN
25.0000 [IU] | Freq: Every day | SUBCUTANEOUS | Status: DC
  Administered 2019-07-23 – 2019-07-24 (×2): 25 [IU] via SUBCUTANEOUS
  Filled 2019-07-23 (×3): qty 0.25

## 2019-07-23 MED ORDER — ATORVASTATIN CALCIUM 80 MG PO TABS
80.0000 mg | ORAL_TABLET | Freq: Every day | ORAL | Status: DC
  Administered 2019-07-23 – 2019-07-25 (×3): 80 mg via ORAL
  Filled 2019-07-23 (×3): qty 1

## 2019-07-23 MED ORDER — PENTOXIFYLLINE ER 400 MG PO TBCR
400.0000 mg | EXTENDED_RELEASE_TABLET | Freq: Three times a day (TID) | ORAL | Status: DC
  Administered 2019-07-23 – 2019-07-25 (×8): 400 mg via ORAL
  Filled 2019-07-23 (×9): qty 1

## 2019-07-23 MED ORDER — ASPIRIN EC 81 MG PO TBEC
81.0000 mg | DELAYED_RELEASE_TABLET | Freq: Every day | ORAL | Status: DC
  Administered 2019-07-23 – 2019-07-25 (×3): 81 mg via ORAL
  Filled 2019-07-23 (×3): qty 1

## 2019-07-23 MED ORDER — TRAMADOL HCL 50 MG PO TABS
50.0000 mg | ORAL_TABLET | Freq: Four times a day (QID) | ORAL | Status: DC | PRN
  Administered 2019-07-23 – 2019-07-25 (×4): 50 mg via ORAL
  Filled 2019-07-23 (×4): qty 1

## 2019-07-23 MED ORDER — CARVEDILOL 25 MG PO TABS
25.0000 mg | ORAL_TABLET | Freq: Two times a day (BID) | ORAL | Status: DC
  Administered 2019-07-23 – 2019-07-25 (×5): 25 mg via ORAL
  Filled 2019-07-23 (×5): qty 1

## 2019-07-23 MED ORDER — FLUOCINONIDE 0.05 % EX CREA
1.0000 "application " | TOPICAL_CREAM | Freq: Two times a day (BID) | CUTANEOUS | Status: DC
  Administered 2019-07-23 – 2019-07-25 (×4): 1 via TOPICAL
  Filled 2019-07-23 (×2): qty 30

## 2019-07-23 MED ORDER — CLOPIDOGREL BISULFATE 75 MG PO TABS
75.0000 mg | ORAL_TABLET | Freq: Every day | ORAL | Status: DC
  Administered 2019-07-23 – 2019-07-25 (×3): 75 mg via ORAL
  Filled 2019-07-23 (×3): qty 1

## 2019-07-23 MED ORDER — INSULIN ASPART 100 UNIT/ML ~~LOC~~ SOLN
6.0000 [IU] | Freq: Three times a day (TID) | SUBCUTANEOUS | Status: DC
  Administered 2019-07-24 – 2019-07-25 (×5): 6 [IU] via SUBCUTANEOUS
  Filled 2019-07-23 (×5): qty 1

## 2019-07-23 MED ORDER — DULOXETINE HCL 30 MG PO CPEP
30.0000 mg | ORAL_CAPSULE | Freq: Every day | ORAL | Status: DC
  Administered 2019-07-23 – 2019-07-25 (×3): 30 mg via ORAL
  Filled 2019-07-23 (×3): qty 1

## 2019-07-23 MED ORDER — METHYLPREDNISOLONE SODIUM SUCC 40 MG IJ SOLR
40.0000 mg | Freq: Every day | INTRAMUSCULAR | Status: DC
  Administered 2019-07-23 – 2019-07-25 (×3): 40 mg via INTRAVENOUS
  Filled 2019-07-23 (×3): qty 1

## 2019-07-23 NOTE — Plan of Care (Signed)

## 2019-07-23 NOTE — Progress Notes (Signed)
Inpatient Diabetes Program Recommendations  AACE/ADA: New Consensus Statement on Inpatient Glycemic Control (2015)  Target Ranges:  Prepandial:   less than 140 mg/dL      Peak postprandial:   less than 180 mg/dL (1-2 hours)      Critically ill patients:  140 - 180 mg/dL   Lab Results  Component Value Date   GLUCAP 203 (H) 07/23/2019   HGBA1C 6.8 (H) 06/21/2019    Review of Glycemic Control Results for Bryce Garcia, Bryce Garcia (MRN [id number]) as of 07/23/2019 12:39  Ref. Range 07/22/2019 23:42 07/23/2019 08:10 07/23/2019 11:51  Glucose-Capillary Latest Ref Range: 70 - 99 mg/dL 140 (H) 230 (H) 203 (H)   Diabetes history: DM2 Outpatient Diabetes medications: Novolin N 5 units q am + 19 units q pm + Novolin R 5-20 units tid meal coverage Current orders for Inpatient glycemic control: Novolog moderate correction tid + hs  Inpatient Diabetes Program Recommendations:   While in the hospital: -Levemir 25 units q hs -Novolog 6 units tid meal coverage if eats 50% meals  Thank you, Nani Gasser. Denzal Meir, RN, MSN, CDE  Diabetes Coordinator Inpatient Glycemic Control Team Team Pager [phone] (8am-5pm) 07/23/2019 12:39 PM

## 2019-07-23 NOTE — ED Notes (Signed)
Attempted to call report to floor. Primary RN unavailable. Was informed that RN would call back as soon as able.

## 2019-07-23 NOTE — Clinical Social Work Note (Signed)
1.  Do you have a scale? Yes 2.  Do you weigh yourself daily? Yes 3.  Two pounds over night or 5 in one week call your doctor because it could be fluid overload? yes  4.  Do you go to the Heart Failure Clinic? Heart doctor located in [place] 5. An appointment with heart failure clinic will be made before discharge if. Apt scheduled for Feb. 19th 6. If patient is home bound, home health can be arranged with an agency that provides a heart failure protocol. Pt receiving services through [facility]; RN   Loletha Grayer, [place]

## 2019-07-23 NOTE — Progress Notes (Signed)
Patient BG 410. Covering Provider, [place] Notified. She has ordered Insulin per diabetes note and has told me to go ahead and give the 5 of Novolog and the [place] she just ordered.

## 2019-07-23 NOTE — ED Notes (Signed)
IV team RN at beside to attempt PIV access

## 2019-07-23 NOTE — Progress Notes (Signed)
Patient ID: Bryce Garcia, male   DOB: 1950-08-04, 69 y.o.   MRN: [id number] [facility] PROGRESS NOTE  Bryce Garcia [id number] DOB: December 02, 1950 DOA: 07/22/2019 PCP: [place]  HPI/Subjective: Patient was feeling a little bit better than yesterday.  He is breathing a little bit better.  Still with some cough and shortness of breath.  Still with some blurred vision that is been going on since the last time he was in the hospital.  Objective: Vitals:   07/23/19 1354 07/23/19 1538  BP:  127/67  Pulse:  68  Resp:  18  Temp:  97.7 F (36.5 C)  SpO2: 98% 99%    Intake/Output Summary (Last 24 hours) at 07/23/2019 1545 Last data filed at 07/23/2019 1433 Gross per 24 hour  Intake 1317.68 ml  Output 2790 ml  Net -1472.32 ml   Filed Weights   07/22/19 1915 07/23/19 0225  Weight: 89.5 kg 83.8 kg    ROS: Review of Systems  Constitutional: Negative for chills and fever.  Eyes: Negative for blurred vision.  Respiratory: Positive for cough and shortness of breath.   Cardiovascular: Negative for chest pain.  Gastrointestinal: Negative for abdominal pain, constipation, diarrhea, nausea and vomiting.  Genitourinary: Negative for dysuria.  Musculoskeletal: Negative for joint pain.  Neurological: Negative for dizziness and headaches.   Exam: Physical Exam  Constitutional: He is oriented to person, place, and time.  HENT:  Nose: No mucosal edema.  Mouth/Throat: No oropharyngeal exudate or posterior oropharyngeal edema.  Eyes: Pupils are equal, round, and reactive to light. Conjunctivae, EOM and lids are normal.  Vision right eye 20/200 without glasses Vision left eye 20/70 without glasses  Neck: Carotid bruit is not present.  Cardiovascular: S1 normal and S2 normal. Exam reveals no gallop.  No murmur heard. Pulses:      Dorsalis pedis pulses are 2+ on the right side and 2+ on the left side.  Respiratory: No respiratory distress. He  has decreased breath sounds in the right lower field and the left lower field. He has no wheezes. He has no rhonchi. He has rales in the right lower field and the left lower field.  GI: Soft. Bowel sounds are normal. There is no abdominal tenderness.  Musculoskeletal:     Right ankle: No swelling.     Left ankle: No swelling.  Lymphadenopathy:    He has no cervical adenopathy.  Neurological: He is alert and oriented to person, place, and time. No cranial nerve deficit.  Skin: Skin is warm. Nails show no clubbing.  Left leg and bilateral arms covered with pressure dressing.  Chronic lower extremity discoloration bilateral lower extremities  Psychiatric: He has a normal mood and affect.      Data Reviewed: Basic Metabolic Panel: Recent Labs  Lab 07/22/19 1927 07/23/19 0416  NA 140 140  K 4.2 4.2  CL 108 107  CO2 22 21*  GLUCOSE 113* 191*  BUN 35* 32*  CREATININE 3.02* 3.00*  CALCIUM 8.9 8.6*   Liver Function Tests: Recent Labs  Lab 07/22/19 1927  AST 18  ALT 12  ALKPHOS 150*  BILITOT 0.7  PROT 6.7  ALBUMIN 2.7*   CBC: Recent Labs  Lab 07/22/19 1927 07/22/19 2321  WBC 9.1 7.5  HGB 9.7* 9.0*  HCT 31.7* 29.3*  MCV 91.1 91.6  PLT 394 349   BNP (last 3 results) Recent Labs    06/21/19 0155 07/22/19 1927 07/22/19 2321  BNP 1,167.0* 590.0*  555.0*     CBG: Recent Labs  Lab 07/22/19 2342 07/23/19 0810 07/23/19 1151  GLUCAP 140* 230* 203*    Recent Results (from the past 240 hour(s))  Blood culture (routine x 2)     Status: None (Preliminary result)   Collection Time: 07/22/19  7:27 PM   Specimen: BLOOD  Result Value Ref Range Status   Specimen Description BLOOD RIGHT ANTECUBITAL  Final   Special Requests   Final    BOTTLES DRAWN AEROBIC AND ANAEROBIC Blood Culture adequate volume   Culture   Final    NO GROWTH < 12 HOURS Performed at [facility], [address], [place], [place] [address]    Report Status PENDING  Incomplete  Blood  culture (routine x 2)     Status: None (Preliminary result)   Collection Time: 07/22/19  7:27 PM   Specimen: BLOOD  Result Value Ref Range Status   Specimen Description BLOOD LEFT FOREARM  Final   Special Requests   Final    BOTTLES DRAWN AEROBIC AND ANAEROBIC Blood Culture adequate volume   Culture   Final    NO GROWTH < 12 HOURS Performed at [facility], [address], [place], [place] [address]    Report Status PENDING  Incomplete     Studies: DG Chest Portable 1 View  Result Date: 07/22/2019 CLINICAL DATA:  Respiratory distress, shortness of breath EXAM: PORTABLE CHEST 1 VIEW COMPARISON:  06/21/2019 FINDINGS: Patchy airspace disease in the lungs bilaterally, right greater than left compatible with pneumonia. Heart is mildly enlarged. Prior CABG. No effusions or acute bony abnormality. IMPRESSION: Patchy bilateral airspace disease, right greater than left most compatible with pneumonia. Cardiomegaly. Electronically Signed   By: Rolm Baptise M.D.   On: 07/22/2019 19:50    Scheduled Meds: . amLODipine  10 mg Oral Daily  . aspirin EC  81 mg Oral Daily  . atorvastatin  80 mg Oral Daily  . carvedilol  25 mg Oral BID  . clopidogrel  75 mg Oral Daily  . DULoxetine  30 mg Oral Daily  . fluocinonide cream  1 application Topical BID  . furosemide  60 mg Intravenous Q12H  . insulin aspart  0-15 Units Subcutaneous TID WC  . insulin aspart  0-5 Units Subcutaneous QHS  . ipratropium-albuterol  3 mL Nebulization Q6H  . levothyroxine  100 mcg Oral Daily  . methylPREDNISolone (SOLU-MEDROL) injection  40 mg Intravenous Daily  . pentoxifylline  400 mg Oral TID  . sodium chloride flush  3 mL Intravenous Q12H   Continuous Infusions: . sodium chloride    . azithromycin 500 mg (07/23/19 0350)  . cefTRIAXone (ROCEPHIN)  IV Stopped (07/23/19 0215)    Assessment/Plan:  1. Acute on chronic hypoxic respiratory failure.  Patient on high flow nasal cannula 6 L this morning and tapered  down to 4 L.  Chronically wears 3 L at home. 2. Acute on chronic diastolic congestive heart failure.  Continue IV Lasix and Coreg. 3. Pneumonia with normal procalcitonin.  Will give short course of antibiotic. 4. COPD exacerbation start Solu-Medrol and nebulizer treatments 5. Type 2 diabetes with essential hypertension and chronic kidney disease stage IV.  Monitor creatinine with diuresis.  On sliding scale with steroids.  Last hemoglobin A1c 6.8.  Continue Norvasc, Coreg and Lasix. 6. Hypothyroidism unspecified on levothyroxine 7. Weakness.  Physical therapy evaluation 8. Blurred vision has ophthalmology appointment as outpatient.  Patient seeing some spots and floaters.  Code Status:  Code Status Orders  (From admission, onward)         Start     Ordered   07/22/19 2333  Do not attempt resuscitation (DNR)  Continuous    Question Answer Comment  In the event of cardiac or respiratory ARREST Do not call a "code blue"   In the event of cardiac or respiratory ARREST Do not perform Intubation, CPR, defibrillation or ACLS   In the event of cardiac or respiratory ARREST Use medication by any route, position, wound care, and other measures to relive pain and suffering. May use oxygen, suction and manual treatment of airway obstruction as needed for comfort.   Comments discussed with patient      07/22/19 2333        Code Status History    Date Active Date Inactive Code Status Order ID Comments User Context   07/22/2019 2258 07/22/2019 2333 Full Code [id number]  Athena Masse, MD ED   06/21/2019 0544 06/28/2019 2203 DNR [id number]  Athena Masse, MD ED   06/21/2019 0341 06/21/2019 0544 Full Code [id number]  Athena Masse, MD ED   Advance Care Planning Activity     Family Communication: Left message for son Disposition Plan: Would like to see him breathe better and get better air entry.  Would like to see him back to his baseline  oxygen.  Antibiotics:  Rocephin  Zithromax  Time spent: 28 minutes  [place]

## 2019-07-24 LAB — CULTURE, BLOOD (ROUTINE X 2): Special Requests: ADEQUATE

## 2019-07-24 LAB — GLUCOSE, CAPILLARY
Glucose-Capillary: 427 mg/dL — ABNORMAL HIGH (ref 70–99)
Glucose-Capillary: 350 mg/dL — ABNORMAL HIGH (ref 70–99)
Glucose-Capillary: 393 mg/dL — ABNORMAL HIGH (ref 70–99)
Glucose-Capillary: 484 mg/dL — ABNORMAL HIGH (ref 70–99)
Glucose-Capillary: 387 mg/dL — ABNORMAL HIGH (ref 70–99)

## 2019-07-24 LAB — BASIC METABOLIC PANEL
Anion gap: 15 (ref 5–15)
BUN: 45 mg/dL — ABNORMAL HIGH (ref 8–23)
CO2: 18 mmol/L — ABNORMAL LOW (ref 22–32)
Calcium: 8.8 mg/dL — ABNORMAL LOW (ref 8.9–10.3)
Chloride: 101 mmol/L (ref 98–111)
Creatinine, Ser: 3.22 mg/dL — ABNORMAL HIGH (ref 0.61–1.24)
GFR calc Af Amer: 22 mL/min — ABNORMAL LOW (ref >60)
GFR calc non Af Amer: 19 mL/min — ABNORMAL LOW (ref >60)
Glucose, Bld: 434 mg/dL — ABNORMAL HIGH (ref 70–99)
Potassium: 4.7 mmol/L (ref 3.5–5.1)
Sodium: 134 mmol/L — ABNORMAL LOW (ref 135–145)

## 2019-07-24 LAB — PROCALCITONIN: Procalcitonin: <0.1 ng/mL

## 2019-07-24 MED ORDER — ENOXAPARIN SODIUM 30 MG/0.3ML ~~LOC~~ SOLN
30.0000 mg | SUBCUTANEOUS | Status: DC
  Administered 2019-07-24: 17:00:00 30 mg via SUBCUTANEOUS
  Filled 2019-07-24: qty 0.3

## 2019-07-24 MED ORDER — INSULIN ASPART 100 UNIT/ML ~~LOC~~ SOLN
20.0000 [IU] | Freq: Once | SUBCUTANEOUS | Status: AC
  Administered 2019-07-24: 09:00:00 20 [IU] via SUBCUTANEOUS
  Filled 2019-07-24: qty 1

## 2019-07-24 MED ORDER — AZITHROMYCIN 250 MG PO TABS
250.0000 mg | ORAL_TABLET | Freq: Every day | ORAL | Status: DC
  Administered 2019-07-25: 08:00:00 250 mg via ORAL
  Filled 2019-07-24: qty 1

## 2019-07-24 MED ORDER — INSULIN ASPART 100 UNIT/ML ~~LOC~~ SOLN
8.0000 [IU] | Freq: Once | SUBCUTANEOUS | Status: AC
  Administered 2019-07-24: 20:00:00 8 [IU] via SUBCUTANEOUS
  Filled 2019-07-24: qty 1

## 2019-07-24 MED ORDER — TORSEMIDE 20 MG PO TABS
40.0000 mg | ORAL_TABLET | Freq: Two times a day (BID) | ORAL | Status: DC
  Administered 2019-07-24 – 2019-07-25 (×2): 40 mg via ORAL
  Filled 2019-07-24 (×2): qty 2

## 2019-07-24 NOTE — Progress Notes (Signed)
Patient ID: Bryce Garcia, male   DOB: 02-Feb-1951, 69 y.o.   MRN: [id number] [facility] PROGRESS NOTE  Bryce Garcia [id number] DOB: 09/18/1950 DOA: 07/22/2019 PCP: [place]  HPI/Subjective: Patient feels better.  States his breathing is better than yesterday.  Down to his baseline oxygen 3 L.  Still with cough while I was in the room.  Still with some congestion.  Still with some shortness of breath.  Objective: Vitals:   07/24/19 0819 07/24/19 1311  BP: (!) 141/60   Pulse: 62   Resp: 17   Temp: 97.6 F (36.4 C)   SpO2: 100% 98%    Intake/Output Summary (Last 24 hours) at 07/24/2019 1511 Last data filed at 07/24/2019 0900 Gross per 24 hour  Intake 574.86 ml  Output 400 ml  Net 174.86 ml   Filed Weights   07/22/19 1915 07/23/19 0225 07/24/19 0447  Weight: 89.5 kg 83.8 kg 83.6 kg    ROS: Review of Systems  Constitutional: Negative for chills and fever.  Eyes: Negative for blurred vision.  Respiratory: Positive for cough and shortness of breath.   Cardiovascular: Negative for chest pain.  Gastrointestinal: Negative for abdominal pain, constipation, diarrhea, nausea and vomiting.  Genitourinary: Negative for dysuria.  Musculoskeletal: Negative for joint pain.  Neurological: Negative for dizziness and headaches.   Exam: Physical Exam  Constitutional: He is oriented to person, place, and time.  HENT:  Nose: No mucosal edema.  Mouth/Throat: No oropharyngeal exudate or posterior oropharyngeal edema.  Eyes: Conjunctivae and lids are normal.  Neck: Carotid bruit is not present.  Cardiovascular: S1 normal and S2 normal. Exam reveals no gallop.  No murmur heard. Respiratory: No respiratory distress. He has decreased breath sounds in the right lower field and the left lower field. He has wheezes in the right middle field and the left middle field. He has rhonchi in the right lower field and the left lower field. He has no  rales.  GI: Soft. There is no abdominal tenderness.  Musculoskeletal:     Right ankle: Swelling present.     Left ankle: Swelling present.  Lymphadenopathy:    He has no cervical adenopathy.  Neurological: He is alert and oriented to person, place, and time. No cranial nerve deficit.  Skin: Skin is warm. Nails show no clubbing.  Left leg and bilateral arms covered with pressure dressing.  Chronic lower extremity discoloration bilateral lower extremities  Psychiatric: He has a normal mood and affect.      Data Reviewed: Basic Metabolic Panel: Recent Labs  Lab 07/22/19 1927 07/23/19 0416 07/24/19 0450  NA 140 140 134*  K 4.2 4.2 4.7  CL 108 107 101  CO2 22 21* 18*  GLUCOSE 113* 191* 434*  BUN 35* 32* 45*  CREATININE 3.02* 3.00* 3.22*  CALCIUM 8.9 8.6* 8.8*   Liver Function Tests: Recent Labs  Lab 07/22/19 1927  AST 18  ALT 12  ALKPHOS 150*  BILITOT 0.7  PROT 6.7  ALBUMIN 2.7*   CBC: Recent Labs  Lab 07/22/19 1927 07/22/19 2321  WBC 9.1 7.5  HGB 9.7* 9.0*  HCT 31.7* 29.3*  MCV 91.1 91.6  PLT 394 349   BNP (last 3 results) Recent Labs    06/21/19 0155 07/22/19 1927 07/22/19 2321  BNP 1,167.0* 590.0* 555.0*     CBG: Recent Labs  Lab 07/23/19 1151 07/23/19 1633 07/23/19 2059 07/24/19 0817 07/24/19 1159  GLUCAP 203* 246* 410* 427* 350*  Recent Results (from the past 240 hour(s))  Blood culture (routine x 2)     Status: None (Preliminary result)   Collection Time: 07/22/19  7:27 PM   Specimen: BLOOD  Result Value Ref Range Status   Specimen Description BLOOD RIGHT ANTECUBITAL  Final   Special Requests   Final    BOTTLES DRAWN AEROBIC AND ANAEROBIC Blood Culture adequate volume   Culture   Final    NO GROWTH 2 DAYS Performed at [facility], [address] [place], [place] [address]    Report Status PENDING  Incomplete  Blood culture (routine x 2)     Status: Abnormal   Collection Time: 07/22/19  7:27 PM   Specimen: BLOOD   Result Value Ref Range Status   Specimen Description   Final    BLOOD LEFT FOREARM Performed at [facility], [address] [place], [place] [address]    Special Requests   Final    BOTTLES DRAWN AEROBIC AND ANAEROBIC Blood Culture adequate volume Performed at [facility], [address], [place], [place] [address]    Culture  Setup Time   Final    AEROBIC BOTTLE ONLY GRAM POSITIVE COCCI CRITICAL RESULT CALLED TO, READ BACK BY AND VERIFIED WITH: [place] @2032  07/23/19 MJU Performed at [facility], [address] [place], [place] [address]    Culture (A)  Final    STAPHYLOCOCCUS SPECIES (COAGULASE NEGATIVE) THE SIGNIFICANCE OF ISOLATING THIS ORGANISM FROM A SINGLE SET OF BLOOD CULTURES WHEN MULTIPLE SETS ARE DRAWN IS UNCERTAIN. PLEASE NOTIFY THE MICROBIOLOGY DEPARTMENT WITHIN ONE WEEK IF SPECIATION AND SENSITIVITIES ARE REQUIRED. Performed at [facility], [place] [address] [place], [place] [address]    Report Status 07/24/2019 FINAL  Final     Studies: DG Chest Portable 1 View  Result Date: 07/22/2019 CLINICAL DATA:  Respiratory distress, shortness of breath EXAM: PORTABLE CHEST 1 VIEW COMPARISON:  06/21/2019 FINDINGS: Patchy airspace disease in the lungs bilaterally, right greater than left compatible with pneumonia. Heart is mildly enlarged. Prior CABG. No effusions or acute bony abnormality. IMPRESSION: Patchy bilateral airspace disease, right greater than left most compatible with pneumonia. Cardiomegaly. Electronically Signed   By: Rolm Baptise M.D.   On: 07/22/2019 19:50    Scheduled Meds: . amLODipine  10 mg Oral Daily  . aspirin EC  81 mg Oral Daily  . atorvastatin  80 mg Oral Daily  . [START ON 07/25/2019] azithromycin  250 mg Oral Daily  . carvedilol  25 mg Oral BID  . clopidogrel  75 mg Oral Daily  . DULoxetine  30 mg Oral Daily  . enoxaparin (LOVENOX) injection  30 mg Subcutaneous Q24H  . fluocinonide cream  1  application Topical BID  . insulin aspart  0-15 Units Subcutaneous TID WC  . insulin aspart  0-5 Units Subcutaneous QHS  . insulin aspart  6 Units Subcutaneous TID WC  . insulin detemir  25 Units Subcutaneous QHS  . ipratropium-albuterol  3 mL Nebulization Q6H  . levothyroxine  100 mcg Oral Daily  . methylPREDNISolone (SOLU-MEDROL) injection  40 mg Intravenous Daily  . pentoxifylline  400 mg Oral TID  . sodium chloride flush  3 mL Intravenous Q12H  . torsemide  40 mg Oral BID   Continuous Infusions: . sodium chloride    . cefTRIAXone (ROCEPHIN)  IV Stopped (07/23/19 2259)    Assessment/Plan:  1. Acute on chronic hypoxic respiratory failure.  Patient on high flow nasal cannula  6 L yesterday.  Today back to his usual 3 L chronic oxygen. 2. Acute on chronic diastolic congestive heart failure.  Patient received IV Lasix this morning.  With creatinine going up a little bit I will change to oral torsemide this afternoon.  Patient on Coreg. 3. Pneumonia with normal procalcitonin.  Patient on Rocephin.  Switch Zithromax to oral. 4. COPD exacerbation.  Continue Solu-Medrol and nebulizer treatments.  Patient moving a little bit better air than yesterday but still quite a bit of wheeze heard. 5. Type 2 diabetes with essential hypertension and chronic kidney disease stage stage IV.  On detemir insulin and sliding scale.  Sugars will be high with steroids.  Patient on Coreg and Norvasc. 6. Hypothyroidism unspecified on levothyroxine 7. Weakness.  Physical therapy evaluation 8. Blurred vision has ophthalmology appointment as outpatient.  Patient seeing some spots and floaters.  Code Status:     Code Status Orders  (From admission, onward)         Start     Ordered   07/22/19 2333  Do not attempt resuscitation (DNR)  Continuous    Question Answer Comment  In the event of cardiac or respiratory ARREST Do not call a "code blue"   In the event of cardiac or respiratory ARREST Do not perform  Intubation, CPR, defibrillation or ACLS   In the event of cardiac or respiratory ARREST Use medication by any route, position, wound care, and other measures to relive pain and suffering. May use oxygen, suction and manual treatment of airway obstruction as needed for comfort.   Comments discussed with patient      07/22/19 2333        Code Status History    Date Active Date Inactive Code Status Order ID Comments User Context   07/22/2019 2258 07/22/2019 2333 Full Code [id number]  Athena Masse, MD ED   06/21/2019 0544 06/28/2019 2203 DNR [id number]  Athena Masse, MD ED   06/21/2019 0341 06/21/2019 0544 Full Code [id number]  Athena Masse, MD ED   Advance Care Planning Activity     Family Communication: Spoke with son on the phone Disposition Plan: Patient moving better air today but lots of wheeze.  Evaluate daily on when to go home.  Potentially another day or so in the hospital.  Antibiotics:  Rocephin  Zithromax  Time spent: 27 minutes  [place]

## 2019-07-24 NOTE — Evaluation (Signed)
Physical Therapy Evaluation Patient Details Name: MASSIMO SHIPES MRN: [id number] DOB: [birth year] Today's Date: 07/24/2019   History of Present Illness  69 y.o. male with medical history significant for diastolic heart failure, CAD with history of MI, CKD 4, type 2 diabetes on insulin, chronic anemia, history of COVID-19 pneumonia in November 2020, on 'as needed' O2 at 3L/min at home since 06/21/2019 when he was hospitalized for acute respiratory distress related to pneumonia and heart failure exacerbation, who was brought in by EMS in respiratory distress requiring CPAP  Clinical Impression  Pt did well with PT and showed relatively good confidence with mobility, ambulation, etc.  He was on 3L on arrival with sats in the high 90s, O2 stayed in the 90s t/o the session, though did slowly but consistently drop to ~90 (on 3L) during ~250 ft of ambulation.  He reports that he does not typically use cane or walker, and was able to ambulte ~100 ft today w/o AD however he was safer, more efficient and consistent with walker during gait training today.  He reports that he just recently completed a round of HHPT and feels that he can return home safely and confidently without further PT.  Son is involved with his care and can help out with community tasks, etc for the time being.  Overall pt is feeling much better than on arrival and is confident that he will be able to manage at home. PT suggested SPC use as he eases back into more community tasks - encouraged a slow/conservative transition to more prolonged outings, etc.    Follow Up Recommendations No PT follow up    Equipment Recommendations  None recommended by PT(encouraged use of cane OOH, ease back into community tasks)    Recommendations for Other Services       Precautions / Restrictions Precautions Precautions: Fall Restrictions Weight Bearing Restrictions: No      Mobility  Bed Mobility Overal bed mobility: Independent                 Transfers Overall transfer level: Independent Equipment used: None             General transfer comment: Pt was able to rise to standing and maintain balance w/o AD, independently and w/o overt safety issues  Ambulation/Gait Ambulation/Gait assistance: Supervision Gait Distance (Feet): 250 Feet Assistive device: Rolling walker (2 wheeled);None       General Gait Details: Pt took a few steps w/o AD initially but had some minimal unsteadiness and asked to use a walker.  He went ~150 ft with walker and ~100 ft w/o AD.  Pt with more fluid and faster cadence with walker, but did not show any overt unsteadiness; did display his baseline L limp.  Pt on 3L O2 t/o the effort, sats stayed in the 90s but slowly dropped from high 90s to ~90% w/o expressing excessive fatigue or having shortness of breath.  Stairs            Wheelchair Mobility    Modified Rankin (Stroke Patients Only)       Balance Overall balance assessment: Modified Independent                                           Pertinent Vitals/Pain Pain Assessment: No/denies pain    Home Living Family/patient expects to be discharged to:: Private residence Living Arrangements: Alone  Available Help at Discharge: Family(son calls daily, can help more if needed) Type of Home: House Home Access: Stairs to enter Entrance Stairs-Rails: Can reach both Entrance Stairs-Number of Steps: 4 Home Layout: One level Home Equipment: Cane - single point(has access to walker)      Prior Function Level of Independence: Needs assistance;Independent   Gait / Transfers Assistance Needed: Pt was performing all asepcts of self care, IADLs and fxl mobility with no AD without assitance until his recent admission  ADL's / Homemaking Assistance Needed: Pt reports Indep with ADLs. In addition, endorses driving, being independent with housework including laundry. Reports performing all meal prep I'ly as well.         Hand Dominance        Extremity/Trunk Assessment   Upper Extremity Assessment Upper Extremity Assessment: Overall WFL for tasks assessed(chronic L sided weakness 2/2 old CVA)    Lower Extremity Assessment Lower Extremity Assessment: Overall WFL for tasks assessed(minimally weaker on L 2/2 old CVA)       Communication   Communication: No difficulties  Cognition Arousal/Alertness: Awake/alert Behavior During Therapy: WFL for tasks assessed/performed Overall Cognitive Status: Within Functional Limits for tasks assessed                                        General Comments      Exercises     Assessment/Plan    PT Assessment Patent does not need any further PT services  PT Problem List         PT Treatment Interventions      PT Goals (Current goals can be found in the Care Plan section)  Acute Rehab PT Goals Patient Stated Goal: go home PT Goal Formulation: All assessment and education complete, DC therapy    Frequency     Barriers to discharge        Co-evaluation               AM-PAC PT "6 Clicks" Mobility  Outcome Measure Help needed turning from your back to your side while in a flat bed without using bedrails?: None Help needed moving from lying on your back to sitting on the side of a flat bed without using bedrails?: None Help needed moving to and from a bed to a chair (including a wheelchair)?: None Help needed standing up from a chair using your arms (e.g., wheelchair or bedside chair)?: None Help needed to walk in hospital room?: None Help needed climbing 3-5 steps with a railing? : None 6 Click Score: 24    End of Session Equipment Utilized During Treatment: Gait belt;Oxygen Activity Tolerance: Patient tolerated treatment well Patient left: in chair;with call bell/phone within reach Nurse Communication: Mobility status(no chair alarm in room) PT Visit Diagnosis: Muscle weakness (generalized) (M62.81);Other  abnormalities of gait and mobility (R26.89)    Time: [id number] PT Time Calculation (min) (ACUTE ONLY): 25 min   Charges:   PT Evaluation $PT Eval Low Complexity: 1 Low PT Treatments $Gait Training: 8-22 mins        Kreg Shropshire, DPT 07/24/2019, 10:41 AM

## 2019-07-24 NOTE — Plan of Care (Signed)
  Problem: Education: Goal: Knowledge of General Education information will improve Description: Including pain rating scale, medication(s)/side effects and non-pharmacologic comfort measures Outcome: Progressing   Problem: Health Behavior/Discharge Planning: Goal: Ability to manage health-related needs will improve Outcome: Progressing   Problem: Clinical Measurements: Goal: Ability to maintain clinical measurements within normal limits will improve Outcome: Not Progressing Note: Sodium level is low at only 134. Will continue to monitor lab values for the remainder of the shift. Wenda Low [facility]

## 2019-07-25 LAB — BASIC METABOLIC PANEL
Anion gap: 12 (ref 5–15)
BUN: 60 mg/dL — ABNORMAL HIGH (ref 8–23)
CO2: 23 mmol/L (ref 22–32)
Calcium: 8.7 mg/dL — ABNORMAL LOW (ref 8.9–10.3)
Chloride: 99 mmol/L (ref 98–111)
Creatinine, Ser: 3.58 mg/dL — ABNORMAL HIGH (ref 0.61–1.24)
GFR calc Af Amer: 19 mL/min — ABNORMAL LOW (ref >60)
GFR calc non Af Amer: 17 mL/min — ABNORMAL LOW (ref >60)
Glucose, Bld: 336 mg/dL — ABNORMAL HIGH (ref 70–99)
Potassium: 4.6 mmol/L (ref 3.5–5.1)
Sodium: 134 mmol/L — ABNORMAL LOW (ref 135–145)

## 2019-07-25 LAB — GLUCOSE, CAPILLARY
Glucose-Capillary: 375 mg/dL — ABNORMAL HIGH (ref 70–99)
Glucose-Capillary: 463 mg/dL — ABNORMAL HIGH (ref 70–99)
Glucose-Capillary: 403 mg/dL — ABNORMAL HIGH (ref 70–99)
Glucose-Capillary: 384 mg/dL — ABNORMAL HIGH (ref 70–99)
Glucose-Capillary: 396 mg/dL — ABNORMAL HIGH (ref 70–99)

## 2019-07-25 MED ORDER — AZITHROMYCIN 250 MG PO TABS: ORAL_TABLET | ORAL | 0 refills | Status: DC

## 2019-07-25 MED ORDER — CEFDINIR 300 MG PO CAPS
300.0000 mg | ORAL_CAPSULE | ORAL | Status: DC
  Filled 2019-07-25: qty 1

## 2019-07-25 MED ORDER — IPRATROPIUM BROMIDE HFA 17 MCG/ACT IN AERS: 2.0000 | INHALATION_SPRAY | Freq: Four times a day (QID) | RESPIRATORY_TRACT | 12 refills | Status: DC | PRN

## 2019-07-25 MED ORDER — INSULIN ASPART 100 UNIT/ML ~~LOC~~ SOLN
21.0000 [IU] | Freq: Once | SUBCUTANEOUS | Status: AC
  Administered 2019-07-25: 15:00:00 21 [IU] via SUBCUTANEOUS
  Filled 2019-07-25: qty 1

## 2019-07-25 MED ORDER — AMLODIPINE BESYLATE 10 MG PO TABS: 5.0000 mg | ORAL_TABLET | Freq: Every day | ORAL | Status: DC

## 2019-07-25 MED ORDER — PREDNISONE 10 MG PO TABS: ORAL_TABLET | ORAL | 0 refills | Status: AC

## 2019-07-25 MED ORDER — INSULIN DETEMIR 100 UNIT/ML ~~LOC~~ SOLN
15.0000 [IU] | Freq: Two times a day (BID) | SUBCUTANEOUS | Status: DC
  Administered 2019-07-25: 11:00:00 15 [IU] via SUBCUTANEOUS
  Filled 2019-07-25 (×3): qty 0.15

## 2019-07-25 MED ORDER — CEFDINIR 300 MG PO CAPS: 300.0000 mg | ORAL_CAPSULE | ORAL | 0 refills | Status: DC

## 2019-07-25 MED ORDER — INSULIN REGULAR HUMAN 100 UNIT/ML IJ SOLN
10.0000 [IU] | Freq: Once | INTRAMUSCULAR | Status: AC
  Administered 2019-07-25: 13:00:00 10 [IU] via INTRAVENOUS
  Filled 2019-07-25: qty 10

## 2019-07-25 NOTE — Discharge Summary (Addendum)
Physician Discharge Summary  Patient left AMA.  Explained possible outcome.  Instructed nurse to have the [place] paper signed by patient before leaving.   Patient ID: Bryce Garcia MRN: [id number] DOB/AGE: 1951/01/23 69 y.o.  Admit date: 07/22/2019 Discharge date: 07/25/2019  Admission Diagnoses:  Discharge Diagnoses:  Principal Problem:   Acute respiratory failure with hypoxia [facility]) Active Problems:   Venous ulcer of left leg (HCC)   CKD (chronic kidney disease) stage 4, GFR 15-29 ml/min (HCC)   History of 2019 novel coronavirus disease (COVID-19)   Hx of CABG   Acute on chronic heart failure with preserved ejection fraction (HFpEF) (HCC)   Anemia of chronic kidney failure, stage 4 (severe) (HCC)   Recurrent pneumonia   Type 2 DM with CKD stage 4 and hypertension (HCC)   Acute on chronic diastolic CHF (congestive heart failure) (HCC)   Lobar pneumonia (HCC)   COPD with acute exacerbation (HCC)   Blurred vision, bilateral   Discharged Condition: Guarded   Hospital Course:   Bryce Garcia is a 69 y.o. male with medical history significant for diastolic heart failure, CAD with history of MI, CKD 4, type 2 diabetes on insulin, chronic anemia, history of COVID-19 pneumonia in November 2020, on 'as needed' O2 at 3L/min at home since 06/21/2019 when he was hospitalized for acute respiratory distress related to pneumonia and heart failure exacerbation, who was brought in by EMS in respiratory distress requiring CPAP.  1. Acute on chronic hypoxic respiratory failure.  Patient on high flow nasal cannula 6 L yesterday.  Today back to his usual 3 L chronic oxygen. -Remain on 3 L at rest and on ambulation to maintain his saturation.  At one point his saturation dropped to mid 80s but bounced back to above 90s. -Patient was started on Solu-Medrol; changed to prednisone taper dose  2. Acute on chronic diastolic congestive heart failure.  Patient received IV Lasix morning. With creatinine going  up a little bit, changed to oral torsemide this afternoon.  Patient on Coreg.  3. Pneumonia with normal procalcitonin. -Suspected patient   -Switch Zithromax and cefdinir to oral.  4. COPD exacerbation.  - Has been receiving Solu-Medrol and nebulizer treatments.  Patient moving a little bit better air than yesterday but still quite a bit of wheeze heard -Patient was advised to stay for further monitoring however he refused to stay -Solu-Medrol changed to prednisone tapered  5. Type 2 diabetes with essential hypertension and chronic kidney disease stage stage IV: -Has been On detemir insulin and sliding scale.   -However his blood glucose was above 400s today.  A dose of IV regular insulin 10 mg given it came down to 380s despite giving another dose of NovoLog per sliding scale.  Blood sugar is expected to remain high since patient is on steroid.  Advised patient to stay overnight for further blood glucose monitoring and management.  However patient refused to stay.  Explained patient possible outcome.  Patient expressed understanding and agreed to sign the AMA paper and leave. -Also patient's creatinine has worsened from yesterday.  Needed to be monitored.  However patient refused to stay.  6. Hypothyroidism unspecified on levothyroxine 7. Weakness.  Physical therapy evaluation was expected 8. Blurred vision has ophthalmology appointment as outpatient.  Patient seeing some spots and floaters.  Consults:   Significant Diagnostic Studies: Radiologic imaging and blood work  Treatments: As per hospital course and discharge med list  Discharge Exam: Blood pressure 133/61, pulse 70, temperature 97.7  F (36.5 C), temperature source Oral, resp. rate 18, height 5\' 8"  (1.727 m), weight 85.5 kg, SpO2 94 %.  Constitutional: He is oriented to person, place, and time.  HENT:  Nose: No mucosal edema.  Mouth/Throat: No oropharyngeal exudate or posterior oropharyngeal edema.  Eyes: Conjunctivae and  lids are normal.  Neck: Carotid bruit is not present.  Cardiovascular: S1 normal and S2 normal. Exam reveals no gallop.  No murmur heard. Respiratory: No respiratory distress.  Decreased breath sounds bibasilar.  Coarse sound.  Mild wheezing scattered. GI: Soft. There is no abdominal tenderness.  Musculoskeletal:     Right ankle: Swelling present.     Left ankle: Swelling present.  Lymphadenopathy:    He has no cervical adenopathy.  Neurological: He is alert and oriented to person, place, and time. No cranial nerve deficit.  Skin: Skin is warm. Nails show no clubbing.  Left leg and bilateral arms covered with pressure dressing.  Chronic lower extremity discoloration bilateral lower extremities  Psychiatric: He has a normal mood and affect.   Disposition: Discharge disposition: 07-Left Against Medical Advice/Left Without Being Seen/Elopement     And left AMA after being seen.  Advised patient to stay overnight for further monitoring and management for his high blood glucose as well as respiratory issues.  However patient declined he stays he understands possible outcome.  Instructed nurses to have patient sign the AMA paper.  Discharge Instructions    Call MD for:  difficulty breathing, headache or visual disturbances   Complete by: As directed    Diet - low sodium heart healthy   Complete by: As directed    Diet - low sodium heart healthy   Complete by: As directed    Diet Carb Modified   Complete by: As directed    Increase activity slowly   Complete by: As directed    Increase activity slowly   Complete by: As directed      Allergies as of 07/25/2019      Reactions   Other Other (See Comments)   Hypoglycemia unawareness Hypoglycemia unawareness      Medication List    TAKE these medications   albuterol 108 (90 Base) MCG/ACT inhaler Commonly known as: VENTOLIN HFA Inhale 2 puffs into the lungs every 4 (four) hours as needed for wheezing.   amLODipine 10 MG  tablet Commonly known as: NORVASC Take 0.5 tablets (5 mg total) by mouth daily. What changed: how much to take   aspirin 81 MG EC tablet Take by mouth.   atorvastatin 80 MG tablet Commonly known as: LIPITOR Take 80 mg by mouth daily.   azithromycin 250 MG tablet Commonly known as: ZITHROMAX For 2 more days Start taking on: July 26, 2019   carvedilol 25 MG tablet Commonly known as: COREG Take 25 mg by mouth 2 (two) times daily.   cefdinir 300 MG capsule Commonly known as: OMNICEF Take 1 capsule (300 mg total) by mouth daily.   clopidogrel 75 MG tablet Commonly known as: PLAVIX Take 75 mg by mouth daily.   DULoxetine 30 MG capsule Commonly known as: CYMBALTA Take 30 mg by mouth daily.   Euthyrox 100 MCG tablet Generic drug: levothyroxine Take 100 mcg by mouth daily.   fluocinonide cream 0.05 % Commonly known as: LIDEX Apply 1 application topically 2 (two) times daily. For rash on legs.   insulin NPH Human 100 UNIT/ML injection Commonly known as: NOVOLIN N Inject 5 units sq qam and 19 units sq q hs,  adjust as directed up to 30 units daily   insulin regular 100 units/mL injection Commonly known as: NOVOLIN R Inject 5-20 Units into the skin 3 (three) times daily before meals. Depending on blood glucose level   ipratropium 17 MCG/ACT inhaler Commonly known as: ATROVENT HFA Inhale 2 puffs into the lungs every 6 (six) hours as needed for wheezing.   pentoxifylline 400 MG CR tablet Commonly known as: TRENTAL Take 400 mg by mouth 3 (three) times daily.   predniSONE 10 MG tablet Commonly known as: DELTASONE Take 1 tablet (10 mg total) by mouth in the morning, at noon, in the evening, and at bedtime for 1 day, THEN 1 tablet (10 mg total) 3 (three) times daily for 1 day, THEN 1 tablet (10 mg total) 2 (two) times daily with a meal for 1 day, THEN 1 tablet (10 mg total) daily for 1 day. Start taking on: July 25, 2019   torsemide 20 MG tablet Commonly known as:  DEMADEX Take 2 tablets (40 mg total) by mouth daily.   traMADol 50 MG tablet Commonly known as: ULTRAM Take 50 mg by mouth every 6 (six) hours as needed.      Follow-up Information    [facility] Follow up on 07/30/2019.   Specialty: Cardiology Why: at 12:30pm. Enter through the [facility] entrance Contact information: [address] [place] [phone]          Signed: Thornell Mule 07/25/2019, 4:18 PM

## 2019-07-25 NOTE — Care Management Important Message (Signed)
Important Message  Patient Details  Name: Bryce Garcia MRN: [id number] Date of Birth: [birth year]   Medicare Important Message Given:  Yes     Dannette Barbara 07/25/2019, 10:49 AM

## 2019-07-25 NOTE — Consult Note (Signed)
WOC Nurse Consult Note: Reason for Consult:Chronic nonhealing wound to left anterior lower leg.  Seen at [facility] wound care.  Alternate orders provided since hydrofera blue is routinely used.   Wound type:chronic nonhealing Pressure Injury POA: NA  Wound TD:[id number] red.   Drainage (amount, consistency, odor) moderate serosanguinous no odor.  Periwound:chronic skin changes to bilateral lower legs Dressing procedure/placement/frequency: cleanse wounds to left anterior lower leg with NS.  Apply Xeroform gauze to wound bed.  Cover with gauze and kerlix/tape.  Change M/W/F  Will not follow at this time.  Please re-consult if needed.  Domenic Moras MSN, RN, FNP-BC CWON Wound, Ostomy, Continence Nurse Pager [phone]

## 2019-07-25 NOTE — Progress Notes (Addendum)
Patient ambulated outside his room while on room air. At rest while on room air oxygen was 98%, while ambulating it dropped briefly down to 86/87 but quickly jumped back up to the 90s while continuing ambulation. After sitting down oxygen has remained above 95%. MD notified.   Update 1148: Patient's blood sugar was 463. MD notified, new orders put in by MD. Will continue to monitor.  Update 1237: Insulin Regular (Novolin R) administration being delayed due to medication not available yet by pharmacy. Have followed up with pharmacy. Will be picking up medication within the next couple of minutes.   Update 1426: Recheck blood sugar after insulin is 403. MD notified, verbal orders in . Verbal orders for 21 units of insulin aspart for now and recheck sugar in one hour.   1609: Patient's blood sugar 384. MD notified. Per MD he would like patient to stay one more night. Patient does not want to stay another night stating he "knows how to control sugar at home". Per MD pt can leave Against Medical Advice and sign the paper. Patient agrees to leave AMA. Verbal orders from MD to check sugar before he leaves and give the SSI amount before he goes. Patient waiting for ride. IV's out and tele monitor off. Wound care performed on left lower leg.   Update 1656: Patient received 21 units of novolog per SSI for BG of 396. Patient left AMA.

## 2019-07-27 LAB — CULTURE, BLOOD (ROUTINE X 2)
Culture: NO GROWTH
Special Requests: ADEQUATE

## 2019-07-29 NOTE — Progress Notes (Signed)
Patient ID: Bryce Garcia, male    DOB: 1951-06-09, 69 y.o.   MRN: [id number]  HPI  Mr Uehara is a 69 y/o male with a history of CAD, DM, CKD, COPD, venous ulcer, previous tobacco use and chronic heart failure. Tested COVID + November 2020.  Echo report from 06/21/2019 reviewed and showed an EF of 50-55% along with moderate MR.   Admitted 07/22/19 due to acute on chronic heart failure. Wound care consult obtained. Initially needed IV lasix and then transitioned to oral diuretics. Given antibiotics for pneumonia. Given solumedrol and nebulizer for COPD. IV insulin given for hyperglycemia.  Left AMA after 3 days. Admitted 06/21/2019 due to shortness of breath due to HF/ NSTEMI. Nephrology and cardiology consults obtained. Needed temporary dialysis. Given antibiotics due to pneumonia.  Discharged after 7 days.   He presents today for his initial visit with a chief complaint of minimal shortness of breath upon moderate exertion. He describes this as chronic in nature having been present for several years. He has associated fatigue, wheezing and pedal edema along with this. He denies any difficulty sleeping, dizziness, abdominal distention, palpitations, chest pain or weight gain.   Being followed by vein & vascular, cardiology, nephrology and primary care at [facility].   Past Medical History:  Diagnosis Date  . CAD (coronary artery disease)   . CHF (congestive heart failure) ([place])   . CKD (chronic kidney disease), stage III   . COPD (chronic obstructive pulmonary disease) ([place])   . COVID-19   . Diabetes mellitus without complication ([place])   . HFrEF (heart failure with reduced ejection fraction) ([place])   . LBBB (left bundle branch block)   . Venous ulcer ([place])    History reviewed. No pertinent surgical history. Family History  Problem Relation Age of Onset  . Diabetes Mother   . Hypertension Mother   . Hyperlipidemia Mother   . Cancer Father    Social History   Tobacco Use  . Smoking status:  Former Research scientist (life sciences)  . Smokeless tobacco: Never Used  Substance Use Topics  . Alcohol use: Not Currently   Allergies  Allergen Reactions  . Other Other (See Comments)    Hypoglycemia unawareness Hypoglycemia unawareness    Prior to Admission medications   Medication Sig Start Date End Date Taking? Authorizing Provider  albuterol (VENTOLIN HFA) 108 (90 Base) MCG/ACT inhaler Inhale 2 puffs into the lungs every 4 (four) hours as needed for wheezing. 05/12/18  Yes [provider]  amLODipine (NORVASC) 10 MG tablet Take 0.5 tablets (5 mg total) by mouth daily. 07/25/19  Yes Thornell Mule, MD  aspirin 81 MG EC tablet Take by mouth. 12/08/06  Yes [provider]  atorvastatin (LIPITOR) 80 MG tablet Take 80 mg by mouth daily. 04/30/19  Yes [provider]  carvedilol (COREG) 25 MG tablet Take 25 mg by mouth 2 (two) times daily. 04/05/19  Yes [provider]  cefdinir (OMNICEF) 300 MG capsule Take 1 capsule (300 mg total) by mouth daily. 07/25/19  Yes Thornell Mule, MD  clopidogrel (PLAVIX) 75 MG tablet Take 75 mg by mouth daily. 04/30/19  Yes [provider]  DULoxetine (CYMBALTA) 30 MG capsule Take 30 mg by mouth daily. 05/22/19  Yes [provider]  EUTHYROX 100 MCG tablet Take 100 mcg by mouth daily. 03/18/19  Yes [provider]  fluocinonide cream (LIDEX) [id number] % Apply 1 application topically 2 (two) times daily. For rash on legs. 10/24/13  Yes [provider]  insulin NPH Human (NOVOLIN N) 100 UNIT/ML injection Inject 5 units sq qam and 19 units sq q hs, adjust as directed up to 30 units daily 04/17/18  Yes [provider]  insulin regular (NOVOLIN R) 100 units/mL injection Inject 5-20 Units into the skin 3 (three) times daily before meals. Depending on blood glucose level 04/17/18  Yes [provider]  ipratropium (ATROVENT HFA) 17 MCG/ACT inhaler Inhale 2 puffs into the lungs every 6 (six) hours as needed for  wheezing. 07/25/19  Yes Thornell Mule, MD  pentoxifylline (TRENTAL) 400 MG CR tablet Take 400 mg by mouth 3 (three) times daily. 06/12/19  Yes [provider]  torsemide (DEMADEX) 20 MG tablet Take 2 tablets (40 mg total) by mouth daily. 06/29/19  Yes Samuella Cota, MD  traMADol (ULTRAM) 50 MG tablet Take 50 mg by mouth every 6 (six) hours as needed. 05/17/19  Yes [provider]    Review of Systems  Constitutional: Positive for fatigue. Negative for appetite change.  HENT: Negative for congestion, postnasal drip and sore throat.   Eyes: Negative.   Respiratory: Positive for shortness of breath (with moderate exertion) and wheezing (sometimes). Negative for chest tightness.   Cardiovascular: Positive for leg swelling. Negative for chest pain and palpitations.  Gastrointestinal: Negative for abdominal distention and abdominal pain.  Endocrine: Negative.   Musculoskeletal: Negative for back pain and neck pain.  Skin: Positive for wound (left lower leg).  Allergic/Immunologic: Negative.   Neurological: Negative for dizziness and light-headedness.  Hematological: Negative for adenopathy. Does not bruise/bleed easily.  Psychiatric/Behavioral: Negative for dysphoric mood and sleep disturbance (sleeping in recliner due to comfort and breathing). The patient is not nervous/anxious.    Vitals:   07/30/19 1253  BP: 129/70  Pulse: 68  Resp: 18  SpO2: 96%  Weight: 191 lb 3.2 oz (86.7 kg)  Height: 5\' 8"  (1.727 m)  PF: (!) 3 L/min   Wt Readings from Last 3 Encounters:  07/30/19 191 lb 3.2 oz (86.7 kg)  07/25/19 188 lb 8 oz (85.5 kg)  06/23/19 197 lb 5 oz (89.5 kg)   Lab Results  Component Value Date   CREATININE 3.58 (H) 07/25/2019   CREATININE 3.22 (H) 07/24/2019   CREATININE 3.00 (H) 07/23/2019    Physical Exam Vitals and nursing note reviewed.  Constitutional:      Appearance: He is well-developed.  HENT:     Head: Normocephalic and atraumatic.  Neck:      Vascular: No JVD.  Cardiovascular:     Rate and Rhythm: Normal rate and regular rhythm.  Pulmonary:     Effort: Pulmonary effort is normal. No respiratory distress.     Breath sounds: Examination of the right-lower field reveals rhonchi. Examination of the left-lower field reveals rhonchi. Rhonchi present. No wheezing or rales.  Abdominal:     Palpations: Abdomen is soft.     Tenderness: There is no abdominal tenderness.  Musculoskeletal:     Cervical back: Normal range of motion and neck supple.     Right lower leg: No tenderness. Edema (trace pitting) present.     Left lower leg: No tenderness. Edema (trace pitting) present.  Neurological:     General: No focal deficit present.     Mental Status: He is alert and oriented to person, place, and time.  Psychiatric:        Mood and Affect: Mood normal.        Behavior: Behavior normal.     Assessment &  Plan:  1: Chronic heart failure with preserved ejection fraction- - NYHA class II - euvolemic today - weighing daily and he was reminded to call for an overnight weight gain of >2 pounds or a weekly weight gain of >5 pounds - not adding salt and has been trying to eat low sodium for "years"; reviewed the importance of closely following a low sodium diet and written dietary information and low sodium cookbook were given to the patient - has cardiology (Sivak) scheduled on 08/17/19 - reports receiving his flu vaccine for this season - BNP 07/22/19 was 555.0  2: DM- - glucose at home today was 138 - saw PCP Melina Copa) 06/19/2019 - A1c 06/21/2019 was 6.8% - venous ulcer being followed by wound clinic; last seen 07/18/19; to ask them about wearing compression socks  3: COPD- - wearing oxygen at 3L in the office; he says that he doesn't always wear it when at home and does not sleep with it on either  4: CKD- - sees nephrology (Mottl) 08/21/19 - BMP 07/25/19 reviewed and showed sodium 134, potassium 4.6, creatinine 3.58 and GFR  17  Patient did not bring his medications nor a list. Each medication was verbally reviewed with the patient and he was encouraged to bring the bottles to every visit to confirm accuracy of list.  Due to patient having multiple providers within the [facility] system, he opts to not make a return appointment at this time. Advised him that he could call at anytime to schedule another appointment and patient was comfortable with this plan.

## 2019-07-30 ENCOUNTER — Encounter: Payer: Self-pay | Admitting: Family

## 2019-07-30 ENCOUNTER — Ambulatory Visit: Payer: Medicare HMO | Attending: Family | Admitting: Family

## 2019-07-30 ENCOUNTER — Other Ambulatory Visit: Payer: Self-pay

## 2019-07-30 VITALS — BP 129/70 | HR 68 | Resp 18 | Ht 68.0 in | Wt 191.2 lb

## 2019-07-30 DIAGNOSIS — Z833 Family history of diabetes mellitus: Secondary | ICD-10-CM | POA: Insufficient documentation

## 2019-07-30 DIAGNOSIS — Z79899 Other long term (current) drug therapy: Secondary | ICD-10-CM | POA: Diagnosis not present

## 2019-07-30 DIAGNOSIS — J449 Chronic obstructive pulmonary disease, unspecified: Secondary | ICD-10-CM

## 2019-07-30 DIAGNOSIS — I2510 Atherosclerotic heart disease of native coronary artery without angina pectoris: Secondary | ICD-10-CM | POA: Insufficient documentation

## 2019-07-30 DIAGNOSIS — Z7982 Long term (current) use of aspirin: Secondary | ICD-10-CM | POA: Diagnosis not present

## 2019-07-30 DIAGNOSIS — N183 Chronic kidney disease, stage 3 unspecified: Secondary | ICD-10-CM | POA: Diagnosis not present

## 2019-07-30 DIAGNOSIS — Z794 Long term (current) use of insulin: Secondary | ICD-10-CM | POA: Insufficient documentation

## 2019-07-30 DIAGNOSIS — N184 Chronic kidney disease, stage 4 (severe): Secondary | ICD-10-CM

## 2019-07-30 DIAGNOSIS — Z87891 Personal history of nicotine dependence: Secondary | ICD-10-CM | POA: Insufficient documentation

## 2019-07-30 DIAGNOSIS — E1122 Type 2 diabetes mellitus with diabetic chronic kidney disease: Secondary | ICD-10-CM | POA: Diagnosis not present

## 2019-07-30 DIAGNOSIS — I5042 Chronic combined systolic (congestive) and diastolic (congestive) heart failure: Secondary | ICD-10-CM | POA: Insufficient documentation

## 2019-07-30 DIAGNOSIS — Z8249 Family history of ischemic heart disease and other diseases of the circulatory system: Secondary | ICD-10-CM | POA: Insufficient documentation

## 2019-07-30 DIAGNOSIS — I5032 Chronic diastolic (congestive) heart failure: Secondary | ICD-10-CM

## 2019-07-30 DIAGNOSIS — Z8616 Personal history of COVID-19: Secondary | ICD-10-CM | POA: Diagnosis not present

## 2019-07-30 DIAGNOSIS — I129 Hypertensive chronic kidney disease with stage 1 through stage 4 chronic kidney disease, or unspecified chronic kidney disease: Secondary | ICD-10-CM

## 2019-07-30 DIAGNOSIS — I252 Old myocardial infarction: Secondary | ICD-10-CM | POA: Diagnosis not present

## 2019-07-30 NOTE — Patient Instructions (Addendum)
Continue weighing daily and call for an overnight weight gain of > 2 pounds or a weekly weight gain of >5 pounds.  Ask vein doctor about the use of compression socks  Call us if you'd like to schedule another appointment.

## 2019-07-31 ENCOUNTER — Telehealth: Payer: Self-pay | Admitting: Family

## 2019-07-31 NOTE — Telephone Encounter (Signed)
Spoke with patient in person at appointment after we received a New patient referral from the hospital on him. Patinet stated he was doing well since he left the hospital. He was checking his weight daily and taking meds as prescribed. He was not sure of the diet hes suppose to be following but we gave him a cook book that will help with his dos and donts.    Alyse Low, [place]

## 2019-08-27 LAB — BLOOD GAS, VENOUS
Acid-Base Excess: 3.5 mmol/L — ABNORMAL HIGH (ref 0.0–2.0)
Bicarbonate: 27.8 mmol/L (ref 20.0–28.0)
FIO2: 6
O2 Saturation: 91.2 %
pCO2, Ven: 40 mmHg — ABNORMAL LOW (ref 44.0–60.0)
pH, Ven: 7.45 — ABNORMAL HIGH (ref 7.250–7.430)
pO2, Ven: 58 mmHg — ABNORMAL HIGH (ref 32.0–45.0)

## 2020-11-26 DIAGNOSIS — E113591 Type 2 diabetes mellitus with proliferative diabetic retinopathy without macular edema, right eye: Secondary | ICD-10-CM | POA: Diagnosis not present

## 2020-11-26 DIAGNOSIS — E113592 Type 2 diabetes mellitus with proliferative diabetic retinopathy without macular edema, left eye: Secondary | ICD-10-CM | POA: Diagnosis not present

## 2020-11-27 DIAGNOSIS — N2581 Secondary hyperparathyroidism of renal origin: Secondary | ICD-10-CM | POA: Diagnosis not present

## 2020-11-27 DIAGNOSIS — N186 End stage renal disease: Secondary | ICD-10-CM | POA: Diagnosis not present

## 2020-11-27 DIAGNOSIS — Z992 Dependence on renal dialysis: Secondary | ICD-10-CM | POA: Diagnosis not present

## 2020-11-29 DIAGNOSIS — N2581 Secondary hyperparathyroidism of renal origin: Secondary | ICD-10-CM | POA: Diagnosis not present

## 2020-11-29 DIAGNOSIS — Z992 Dependence on renal dialysis: Secondary | ICD-10-CM | POA: Diagnosis not present

## 2020-11-29 DIAGNOSIS — N186 End stage renal disease: Secondary | ICD-10-CM | POA: Diagnosis not present

## 2020-12-02 DIAGNOSIS — N2581 Secondary hyperparathyroidism of renal origin: Secondary | ICD-10-CM | POA: Diagnosis not present

## 2020-12-02 DIAGNOSIS — N186 End stage renal disease: Secondary | ICD-10-CM | POA: Diagnosis not present

## 2020-12-02 DIAGNOSIS — Z992 Dependence on renal dialysis: Secondary | ICD-10-CM | POA: Diagnosis not present

## 2020-12-04 DIAGNOSIS — Z992 Dependence on renal dialysis: Secondary | ICD-10-CM | POA: Diagnosis not present

## 2020-12-04 DIAGNOSIS — N2581 Secondary hyperparathyroidism of renal origin: Secondary | ICD-10-CM | POA: Diagnosis not present

## 2020-12-04 DIAGNOSIS — N186 End stage renal disease: Secondary | ICD-10-CM | POA: Diagnosis not present

## 2020-12-06 DIAGNOSIS — Z992 Dependence on renal dialysis: Secondary | ICD-10-CM | POA: Diagnosis not present

## 2020-12-06 DIAGNOSIS — N2581 Secondary hyperparathyroidism of renal origin: Secondary | ICD-10-CM | POA: Diagnosis not present

## 2020-12-06 DIAGNOSIS — N186 End stage renal disease: Secondary | ICD-10-CM | POA: Diagnosis not present

## 2020-12-08 DIAGNOSIS — E119 Type 2 diabetes mellitus without complications: Secondary | ICD-10-CM | POA: Diagnosis not present

## 2020-12-08 DIAGNOSIS — B351 Tinea unguium: Secondary | ICD-10-CM | POA: Diagnosis not present

## 2020-12-09 DIAGNOSIS — N2581 Secondary hyperparathyroidism of renal origin: Secondary | ICD-10-CM | POA: Diagnosis not present

## 2020-12-09 DIAGNOSIS — Z992 Dependence on renal dialysis: Secondary | ICD-10-CM | POA: Diagnosis not present

## 2020-12-09 DIAGNOSIS — N186 End stage renal disease: Secondary | ICD-10-CM | POA: Diagnosis not present

## 2020-12-11 DIAGNOSIS — Z992 Dependence on renal dialysis: Secondary | ICD-10-CM | POA: Diagnosis not present

## 2020-12-11 DIAGNOSIS — N2581 Secondary hyperparathyroidism of renal origin: Secondary | ICD-10-CM | POA: Diagnosis not present

## 2020-12-11 DIAGNOSIS — N186 End stage renal disease: Secondary | ICD-10-CM | POA: Diagnosis not present

## 2020-12-13 DIAGNOSIS — N186 End stage renal disease: Secondary | ICD-10-CM | POA: Diagnosis not present

## 2020-12-13 DIAGNOSIS — N2581 Secondary hyperparathyroidism of renal origin: Secondary | ICD-10-CM | POA: Diagnosis not present

## 2020-12-13 DIAGNOSIS — Z992 Dependence on renal dialysis: Secondary | ICD-10-CM | POA: Diagnosis not present

## 2020-12-15 ENCOUNTER — Inpatient Hospital Stay: Payer: Medicare HMO

## 2020-12-15 ENCOUNTER — Observation Stay
Admission: EM | Admit: 2020-12-15 | Discharge: 2020-12-16 | Disposition: A | Discharge status: Home / Self Care | Payer: Medicare HMO | Attending: Internal Medicine | Admitting: Internal Medicine

## 2020-12-15 ENCOUNTER — Emergency Department: Payer: Medicare HMO

## 2020-12-15 ENCOUNTER — Other Ambulatory Visit: Payer: Self-pay

## 2020-12-15 DIAGNOSIS — Z794 Long term (current) use of insulin: Secondary | ICD-10-CM | POA: Insufficient documentation

## 2020-12-15 DIAGNOSIS — J9621 Acute and chronic respiratory failure with hypoxia: Secondary | ICD-10-CM | POA: Diagnosis present

## 2020-12-15 DIAGNOSIS — I2510 Atherosclerotic heart disease of native coronary artery without angina pectoris: Secondary | ICD-10-CM | POA: Diagnosis present

## 2020-12-15 DIAGNOSIS — I10 Essential (primary) hypertension: Secondary | ICD-10-CM | POA: Diagnosis present

## 2020-12-15 DIAGNOSIS — Z951 Presence of aortocoronary bypass graft: Secondary | ICD-10-CM | POA: Diagnosis not present

## 2020-12-15 DIAGNOSIS — I252 Old myocardial infarction: Secondary | ICD-10-CM | POA: Diagnosis not present

## 2020-12-15 DIAGNOSIS — R0902 Hypoxemia: Secondary | ICD-10-CM | POA: Diagnosis not present

## 2020-12-15 DIAGNOSIS — Z743 Need for continuous supervision: Secondary | ICD-10-CM | POA: Diagnosis not present

## 2020-12-15 DIAGNOSIS — J449 Chronic obstructive pulmonary disease, unspecified: Secondary | ICD-10-CM | POA: Diagnosis present

## 2020-12-15 DIAGNOSIS — R4182 Altered mental status, unspecified: Secondary | ICD-10-CM | POA: Diagnosis not present

## 2020-12-15 DIAGNOSIS — I120 Hypertensive chronic kidney disease with stage 5 chronic kidney disease or end stage renal disease: Secondary | ICD-10-CM | POA: Diagnosis not present

## 2020-12-15 DIAGNOSIS — N184 Chronic kidney disease, stage 4 (severe): Secondary | ICD-10-CM | POA: Insufficient documentation

## 2020-12-15 DIAGNOSIS — M5136 Other intervertebral disc degeneration, lumbar region: Secondary | ICD-10-CM | POA: Diagnosis not present

## 2020-12-15 DIAGNOSIS — E785 Hyperlipidemia, unspecified: Secondary | ICD-10-CM | POA: Diagnosis present

## 2020-12-15 DIAGNOSIS — K4090 Unilateral inguinal hernia, without obstruction or gangrene, not specified as recurrent: Secondary | ICD-10-CM | POA: Diagnosis not present

## 2020-12-15 DIAGNOSIS — I517 Cardiomegaly: Secondary | ICD-10-CM | POA: Diagnosis not present

## 2020-12-15 DIAGNOSIS — J189 Pneumonia, unspecified organism: Principal | ICD-10-CM | POA: Diagnosis present

## 2020-12-15 DIAGNOSIS — A419 Sepsis, unspecified organism: Secondary | ICD-10-CM

## 2020-12-15 DIAGNOSIS — I5033 Acute on chronic diastolic (congestive) heart failure: Secondary | ICD-10-CM | POA: Insufficient documentation

## 2020-12-15 DIAGNOSIS — K5730 Diverticulosis of large intestine without perforation or abscess without bleeding: Secondary | ICD-10-CM | POA: Diagnosis not present

## 2020-12-15 DIAGNOSIS — E1122 Type 2 diabetes mellitus with diabetic chronic kidney disease: Secondary | ICD-10-CM | POA: Diagnosis not present

## 2020-12-15 DIAGNOSIS — T68XXXA Hypothermia, initial encounter: Secondary | ICD-10-CM | POA: Diagnosis present

## 2020-12-15 DIAGNOSIS — Z87891 Personal history of nicotine dependence: Secondary | ICD-10-CM | POA: Insufficient documentation

## 2020-12-15 DIAGNOSIS — N186 End stage renal disease: Secondary | ICD-10-CM

## 2020-12-15 DIAGNOSIS — I5032 Chronic diastolic (congestive) heart failure: Secondary | ICD-10-CM | POA: Diagnosis present

## 2020-12-15 DIAGNOSIS — Z7982 Long term (current) use of aspirin: Secondary | ICD-10-CM | POA: Diagnosis not present

## 2020-12-15 DIAGNOSIS — J90 Pleural effusion, not elsewhere classified: Secondary | ICD-10-CM | POA: Diagnosis not present

## 2020-12-15 DIAGNOSIS — G9340 Encephalopathy, unspecified: Secondary | ICD-10-CM

## 2020-12-15 DIAGNOSIS — E161 Other hypoglycemia: Secondary | ICD-10-CM | POA: Diagnosis not present

## 2020-12-15 DIAGNOSIS — R6520 Severe sepsis without septic shock: Secondary | ICD-10-CM

## 2020-12-15 DIAGNOSIS — E1129 Type 2 diabetes mellitus with other diabetic kidney complication: Secondary | ICD-10-CM | POA: Diagnosis present

## 2020-12-15 DIAGNOSIS — Z992 Dependence on renal dialysis: Secondary | ICD-10-CM

## 2020-12-15 DIAGNOSIS — R404 Transient alteration of awareness: Secondary | ICD-10-CM | POA: Diagnosis not present

## 2020-12-15 DIAGNOSIS — Z79899 Other long term (current) drug therapy: Secondary | ICD-10-CM | POA: Diagnosis not present

## 2020-12-15 DIAGNOSIS — E162 Hypoglycemia, unspecified: Secondary | ICD-10-CM | POA: Diagnosis present

## 2020-12-15 DIAGNOSIS — G9341 Metabolic encephalopathy: Secondary | ICD-10-CM | POA: Diagnosis not present

## 2020-12-15 DIAGNOSIS — K5750 Diverticulosis of both small and large intestine without perforation or abscess without bleeding: Secondary | ICD-10-CM | POA: Diagnosis not present

## 2020-12-15 DIAGNOSIS — Z20822 Contact with and (suspected) exposure to covid-19: Secondary | ICD-10-CM | POA: Insufficient documentation

## 2020-12-15 DIAGNOSIS — D631 Anemia in chronic kidney disease: Secondary | ICD-10-CM | POA: Diagnosis not present

## 2020-12-15 DIAGNOSIS — N2581 Secondary hyperparathyroidism of renal origin: Secondary | ICD-10-CM | POA: Diagnosis not present

## 2020-12-15 DIAGNOSIS — Z7902 Long term (current) use of antithrombotics/antiplatelets: Secondary | ICD-10-CM | POA: Insufficient documentation

## 2020-12-15 DIAGNOSIS — E039 Hypothyroidism, unspecified: Secondary | ICD-10-CM | POA: Diagnosis not present

## 2020-12-15 DIAGNOSIS — Z8616 Personal history of COVID-19: Secondary | ICD-10-CM | POA: Diagnosis not present

## 2020-12-15 DIAGNOSIS — F32A Depression, unspecified: Secondary | ICD-10-CM | POA: Diagnosis present

## 2020-12-15 DIAGNOSIS — E11649 Type 2 diabetes mellitus with hypoglycemia without coma: Secondary | ICD-10-CM | POA: Diagnosis present

## 2020-12-15 DIAGNOSIS — J4489 Other specified chronic obstructive pulmonary disease: Secondary | ICD-10-CM | POA: Diagnosis present

## 2020-12-15 LAB — URINALYSIS, COMPLETE (UACMP) WITH MICROSCOPIC
Bacteria, UA: NONE SEEN
Bilirubin Urine: NEGATIVE
Glucose, UA: >=500 mg/dL — AB
Hgb urine dipstick: NEGATIVE
Ketones, ur: 5 mg/dL — AB
Leukocytes,Ua: NEGATIVE
Nitrite: NEGATIVE
Protein, ur: >=300 mg/dL — AB
Specific Gravity, Urine: 1.008 (ref 1.005–1.030)
Squamous Epithelial / HPF: NONE SEEN (ref 0–5)
pH: 9 — ABNORMAL HIGH (ref 5.0–8.0)

## 2020-12-15 LAB — COMPREHENSIVE METABOLIC PANEL
ALT: 13 U/L (ref 0–44)
AST: 41 U/L (ref 15–41)
Albumin: 3.6 g/dL (ref 3.5–5.0)
Alkaline Phosphatase: 164 U/L — ABNORMAL HIGH (ref 38–126)
Anion gap: 12 (ref 5–15)
BUN: 35 mg/dL — ABNORMAL HIGH (ref 8–23)
CO2: 27 mmol/L (ref 22–32)
Calcium: 8.3 mg/dL — ABNORMAL LOW (ref 8.9–10.3)
Chloride: 97 mmol/L — ABNORMAL LOW (ref 98–111)
Creatinine, Ser: 4.65 mg/dL — ABNORMAL HIGH (ref 0.61–1.24)
GFR, Estimated: 13 mL/min — ABNORMAL LOW (ref >60)
Glucose, Bld: 158 mg/dL — ABNORMAL HIGH (ref 70–99)
Potassium: 5 mmol/L (ref 3.5–5.1)
Sodium: 136 mmol/L (ref 135–145)
Total Bilirubin: 1.2 mg/dL (ref 0.3–1.2)
Total Protein: 7.2 g/dL (ref 6.5–8.1)

## 2020-12-15 LAB — RESP PANEL BY RT-PCR (FLU A&B, COVID) ARPGX2
Influenza A by PCR: NEGATIVE
Influenza B by PCR: NEGATIVE
SARS Coronavirus 2 by RT PCR: NEGATIVE

## 2020-12-15 LAB — CBC WITH DIFFERENTIAL/PLATELET
Abs Immature Granulocytes: 0.08 10*3/uL — ABNORMAL HIGH (ref 0.00–0.07)
Basophils Absolute: 0 10*3/uL (ref 0.0–0.1)
Basophils Relative: 0 %
Eosinophils Absolute: 0.3 10*3/uL (ref 0.0–0.5)
Eosinophils Relative: 2 %
HCT: 36.7 % — ABNORMAL LOW (ref 39.0–52.0)
Hemoglobin: 12.2 g/dL — ABNORMAL LOW (ref 13.0–17.0)
Immature Granulocytes: 1 %
Lymphocytes Relative: 10 %
Lymphs Abs: 1.4 10*3/uL (ref 0.7–4.0)
MCH: 33.3 pg (ref 26.0–34.0)
MCHC: 33.2 g/dL (ref 30.0–36.0)
MCV: 100.3 fL — ABNORMAL HIGH (ref 80.0–100.0)
Monocytes Absolute: 0.8 10*3/uL (ref 0.1–1.0)
Monocytes Relative: 6 %
Neutro Abs: 10.7 10*3/uL — ABNORMAL HIGH (ref 1.7–7.7)
Neutrophils Relative %: 81 %
Platelets: 203 10*3/uL (ref 150–400)
RBC: 3.66 MIL/uL — ABNORMAL LOW (ref 4.22–5.81)
RDW: 14.3 % (ref 11.5–15.5)
WBC: 13.2 10*3/uL — ABNORMAL HIGH (ref 4.0–10.5)
nRBC: 0 % (ref 0.0–0.2)

## 2020-12-15 LAB — BLOOD GAS, VENOUS
Acid-Base Excess: 0.9 mmol/L (ref 0.0–2.0)
Bicarbonate: 27.1 mmol/L (ref 20.0–28.0)
O2 Saturation: 89.7 %
Patient temperature: 37
pCO2, Ven: 49 mmHg (ref 44.0–60.0)
pH, Ven: 7.35 (ref 7.250–7.430)
pO2, Ven: 61 mmHg — ABNORMAL HIGH (ref 32.0–45.0)

## 2020-12-15 LAB — PROCALCITONIN: Procalcitonin: <0.1 ng/mL

## 2020-12-15 LAB — CBG MONITORING, ED
Glucose-Capillary: 161 mg/dL — ABNORMAL HIGH (ref 70–99)
Glucose-Capillary: 167 mg/dL — ABNORMAL HIGH (ref 70–99)
Glucose-Capillary: 215 mg/dL — ABNORMAL HIGH (ref 70–99)
Glucose-Capillary: 298 mg/dL — ABNORMAL HIGH (ref 70–99)
Glucose-Capillary: 355 mg/dL — ABNORMAL HIGH (ref 70–99)
Glucose-Capillary: 418 mg/dL — ABNORMAL HIGH (ref 70–99)
Glucose-Capillary: 421 mg/dL — ABNORMAL HIGH (ref 70–99)

## 2020-12-15 LAB — LACTIC ACID, PLASMA
Lactic Acid, Venous: 2.9 mmol/L (ref 0.5–1.9)
Lactic Acid, Venous: 1.6 mmol/L (ref 0.5–1.9)

## 2020-12-15 LAB — LIPASE, BLOOD: Lipase: 43 U/L (ref 11–51)

## 2020-12-15 LAB — HIV ANTIBODY (ROUTINE TESTING W REFLEX): HIV Screen 4th Generation wRfx: NONREACTIVE

## 2020-12-15 MED ORDER — CLOPIDOGREL BISULFATE 75 MG PO TABS
75.0000 mg | ORAL_TABLET | Freq: Every day | ORAL | Status: DC
  Administered 2020-12-15 – 2020-12-16 (×2): 75 mg via ORAL
  Filled 2020-12-15 (×2): qty 1

## 2020-12-15 MED ORDER — ATORVASTATIN CALCIUM 80 MG PO TABS
80.0000 mg | ORAL_TABLET | Freq: Every day | ORAL | Status: DC
  Administered 2020-12-15 – 2020-12-16 (×2): 80 mg via ORAL
  Filled 2020-12-15: qty 1
  Filled 2020-12-15: qty 4

## 2020-12-15 MED ORDER — INSULIN ASPART 100 UNIT/ML IJ SOLN
15.0000 [IU] | Freq: Once | INTRAMUSCULAR | Status: AC
  Administered 2020-12-15: 15 [IU] via SUBCUTANEOUS
  Filled 2020-12-15: qty 1

## 2020-12-15 MED ORDER — SODIUM CHLORIDE 0.9 % IV SOLN
1.0000 g | INTRAVENOUS | Status: DC
  Administered 2020-12-16: 1 g via INTRAVENOUS
  Filled 2020-12-15: qty 1

## 2020-12-15 MED ORDER — LEVOTHYROXINE SODIUM 100 MCG PO TABS
100.0000 ug | ORAL_TABLET | Freq: Every day | ORAL | Status: DC
  Administered 2020-12-15 – 2020-12-16 (×2): 100 ug via ORAL
  Filled 2020-12-15: qty 1
  Filled 2020-12-15: qty 2

## 2020-12-15 MED ORDER — ASPIRIN EC 81 MG PO TBEC
81.0000 mg | DELAYED_RELEASE_TABLET | Freq: Every day | ORAL | Status: DC
  Administered 2020-12-16: 81 mg via ORAL
  Filled 2020-12-15: qty 1

## 2020-12-15 MED ORDER — INSULIN ASPART 100 UNIT/ML IJ SOLN
0.0000 [IU] | Freq: Three times a day (TID) | INTRAMUSCULAR | Status: DC
  Administered 2020-12-16 (×2): 5 [IU] via SUBCUTANEOUS
  Filled 2020-12-15 (×2): qty 1

## 2020-12-15 MED ORDER — VANCOMYCIN HCL 750 MG/150ML IV SOLN
750.0000 mg | Freq: Once | INTRAVENOUS | Status: DC
  Filled 2020-12-15: qty 150

## 2020-12-15 MED ORDER — HEPARIN SODIUM (PORCINE) 5000 UNIT/ML IJ SOLN
5000.0000 [IU] | Freq: Three times a day (TID) | INTRAMUSCULAR | Status: DC
  Administered 2020-12-15 – 2020-12-16 (×4): 5000 [IU] via SUBCUTANEOUS
  Filled 2020-12-15 (×5): qty 1

## 2020-12-15 MED ORDER — VANCOMYCIN HCL IN DEXTROSE 1-5 GM/200ML-% IV SOLN
1000.0000 mg | Freq: Once | INTRAVENOUS | Status: AC
  Administered 2020-12-15: 1000 mg via INTRAVENOUS
  Filled 2020-12-15: qty 200

## 2020-12-15 MED ORDER — INSULIN ASPART 100 UNIT/ML IJ SOLN
0.0000 [IU] | Freq: Every day | INTRAMUSCULAR | Status: DC
  Filled 2020-12-15: qty 1

## 2020-12-15 MED ORDER — SODIUM CHLORIDE 0.9 % IV SOLN: Freq: Once | INTRAVENOUS | Status: AC

## 2020-12-15 MED ORDER — VANCOMYCIN HCL 500 MG/100ML IV SOLN
500.0000 mg | Freq: Once | INTRAVENOUS | Status: AC
  Administered 2020-12-15: 500 mg via INTRAVENOUS
  Filled 2020-12-15 (×2): qty 100

## 2020-12-15 MED ORDER — DULOXETINE HCL 30 MG PO CPEP
30.0000 mg | ORAL_CAPSULE | Freq: Every day | ORAL | Status: DC
  Administered 2020-12-15 – 2020-12-16 (×2): 30 mg via ORAL
  Filled 2020-12-15 (×3): qty 1

## 2020-12-15 MED ORDER — ONDANSETRON HCL 4 MG/2ML IJ SOLN: 4.0000 mg | Freq: Three times a day (TID) | INTRAMUSCULAR | Status: DC | PRN

## 2020-12-15 MED ORDER — PENTOXIFYLLINE ER 400 MG PO TBCR
400.0000 mg | EXTENDED_RELEASE_TABLET | Freq: Two times a day (BID) | ORAL | Status: DC
  Administered 2020-12-15 – 2020-12-16 (×2): 400 mg via ORAL
  Filled 2020-12-15 (×5): qty 1

## 2020-12-15 MED ORDER — METRONIDAZOLE 500 MG/100ML IV SOLN
500.0000 mg | Freq: Once | INTRAVENOUS | Status: AC
  Administered 2020-12-15: 500 mg via INTRAVENOUS
  Filled 2020-12-15: qty 100

## 2020-12-15 MED ORDER — IPRATROPIUM-ALBUTEROL 0.5-2.5 (3) MG/3ML IN SOLN
3.0000 mL | RESPIRATORY_TRACT | Status: DC
  Administered 2020-12-15 – 2020-12-16 (×3): 3 mL via RESPIRATORY_TRACT
  Filled 2020-12-15 (×4): qty 3

## 2020-12-15 MED ORDER — SODIUM CHLORIDE 0.9 % IV BOLUS
500.0000 mL | Freq: Once | INTRAVENOUS | Status: AC
  Administered 2020-12-15: 500 mL via INTRAVENOUS

## 2020-12-15 MED ORDER — CARVEDILOL 25 MG PO TABS
25.0000 mg | ORAL_TABLET | Freq: Two times a day (BID) | ORAL | Status: DC
  Administered 2020-12-15 – 2020-12-16 (×3): 25 mg via ORAL
  Filled 2020-12-15 (×3): qty 1

## 2020-12-15 MED ORDER — DM-GUAIFENESIN ER 30-600 MG PO TB12: 1.0000 | ORAL_TABLET | Freq: Two times a day (BID) | ORAL | Status: DC | PRN

## 2020-12-15 MED ORDER — DEXTROSE 50 % IV SOLN: 50.0000 mL | INTRAVENOUS | Status: DC | PRN

## 2020-12-15 MED ORDER — VANCOMYCIN HCL IN DEXTROSE 1-5 GM/200ML-% IV SOLN
1000.0000 mg | INTRAVENOUS | Status: DC
  Filled 2020-12-15: qty 200

## 2020-12-15 MED ORDER — ALBUTEROL SULFATE (2.5 MG/3ML) 0.083% IN NEBU: 2.5000 mg | INHALATION_SOLUTION | RESPIRATORY_TRACT | Status: DC | PRN

## 2020-12-15 MED ORDER — HYDRALAZINE HCL 20 MG/ML IJ SOLN: 5.0000 mg | INTRAMUSCULAR | Status: DC | PRN

## 2020-12-15 MED ORDER — TRAMADOL HCL 50 MG PO TABS
50.0000 mg | ORAL_TABLET | Freq: Four times a day (QID) | ORAL | Status: DC | PRN
  Administered 2020-12-15: 50 mg via ORAL
  Filled 2020-12-15: qty 1

## 2020-12-15 MED ORDER — SODIUM CHLORIDE 0.9 % IV SOLN
2.0000 g | Freq: Once | INTRAVENOUS | Status: AC
  Administered 2020-12-15: 2 g via INTRAVENOUS
  Filled 2020-12-15: qty 2

## 2020-12-15 MED ORDER — ACETAMINOPHEN 325 MG PO TABS: 650.0000 mg | ORAL_TABLET | Freq: Four times a day (QID) | ORAL | Status: DC | PRN

## 2020-12-15 MED ORDER — DM-GUAIFENESIN ER 30-600 MG PO TB12
1.0000 | ORAL_TABLET | Freq: Two times a day (BID) | ORAL | Status: DC
  Filled 2020-12-15: qty 1

## 2020-12-15 NOTE — ED Notes (Signed)
Lab personnel attempting to obtain blood cultures at this time.

## 2020-12-15 NOTE — ED Provider Notes (Signed)
[facility] Emergency Department Provider Note    Event Date/Time   First MD Initiated Contact with Patient 12/15/20 0719     (approximate)  I have reviewed the triage vital signs and the nursing notes.   HISTORY  Chief Complaint Hypoglycemia and Altered Mental Status    HPI Bryce Garcia is a 70 y.o. male extensive past medical history as listed below presents to the ER for altered mental status and concern for low blood sugar since last night.  Patient drowsy but protecting his airway.  Per not providing much additional history but will answer yes or no questions.  Denies any falls.  Denies any chest pain or shortness of breath.  No nausea.  No abdominal pain.  No dysuria.  No recent changes to his medications.  Past Medical History:  Diagnosis Date   CAD (coronary artery disease)    CHF (congestive heart failure) (HCC)    CKD (chronic kidney disease), stage III (HCC)    COPD (chronic obstructive pulmonary disease) ([place])    COVID-19    Diabetes mellitus without complication (HCC)    HFrEF (heart failure with reduced ejection fraction) (HCC)    LBBB (left bundle branch block)    Venous ulcer ([place])    Family History  Problem Relation Age of Onset   Diabetes Mother    Hypertension Mother    Hyperlipidemia Mother    Cancer Father    History reviewed. No pertinent surgical history. Patient Active Problem List   Diagnosis Date Noted   Lobar pneumonia ([place])    COPD with acute exacerbation ([place])    Blurred vision, bilateral    Anemia of chronic kidney failure, stage 4 (severe) ([place]) 07/22/2019   Recurrent pneumonia 07/22/2019   Type 2 DM with CKD stage 4 and hypertension ([place]) 07/22/2019   Acute on chronic diastolic CHF (congestive heart failure) ([place]) 07/22/2019   Acute on chronic heart failure with preserved ejection fraction (HFpEF) (HCC)    Acute renal failure with acute renal cortical necrosis superimposed on stage 3 chronic kidney disease  ([place])    CAP (community acquired pneumonia) 06/21/2019   Acute respiratory failure with hypoxia ([place]) 06/21/2019   Venous ulcer of left leg ([place]) 06/21/2019   COPD with chronic bronchitis ([place]) 06/21/2019   CKD (chronic kidney disease) stage 4, GFR 15-29 ml/min ([place]) 06/21/2019   AKI (acute kidney injury) ([place]) [id number]   Chronic systolic CHF (congestive heart failure) ([place]) 06/21/2019   History of 2019 novel coronavirus disease (COVID-19) 06/21/2019   Anemia 06/21/2019   Acute respiratory failure ([place]) 06/21/2019   Hyperglycemia due to type 1 diabetes mellitus ([place]) 06/21/2019   Hx of CABG 06/21/2019   Non-ST elevation (NSTEMI) myocardial infarction ([place]) 06/21/2019      Prior to Admission medications   Medication Sig Start Date End Date Taking? Authorizing Provider  albuterol (VENTOLIN HFA) 108 (90 Base) MCG/ACT inhaler Inhale 2 puffs into the lungs every 4 (four) hours as needed for wheezing. 05/12/18   [provider]  amLODipine (NORVASC) 10 MG tablet Take 0.5 tablets (5 mg total) by mouth daily. 07/25/19   Thornell Mule, MD  aspirin 81 MG EC tablet Take by mouth. 12/08/06   [provider]  atorvastatin (LIPITOR) 80 MG tablet Take 80 mg by mouth daily. 04/30/19   [provider]  carvedilol (COREG) 25 MG tablet Take 25 mg by mouth 2 (two) times daily. 04/05/19   [provider]  cefdinir (OMNICEF) 300 MG  capsule Take 1 capsule (300 mg total) by mouth daily. 07/25/19   Thornell Mule, MD  clopidogrel (PLAVIX) 75 MG tablet Take 75 mg by mouth daily. 04/30/19   [provider]  DULoxetine (CYMBALTA) 30 MG capsule Take 30 mg by mouth daily. 05/22/19   [provider]  EUTHYROX 100 MCG tablet Take 100 mcg by mouth daily. 03/18/19   [provider]  fluocinonide cream (LIDEX) [id number] % Apply 1 application topically 2 (two) times daily. For rash on legs. 10/24/13   [provider]  insulin NPH Human (NOVOLIN N) 100 UNIT/ML  injection Inject 5 units sq qam and 19 units sq q hs, adjust as directed up to 30 units daily 04/17/18   [provider]  insulin regular (NOVOLIN R) 100 units/mL injection Inject 5-20 Units into the skin 3 (three) times daily before meals. Depending on blood glucose level 04/17/18   [provider]  ipratropium (ATROVENT HFA) 17 MCG/ACT inhaler Inhale 2 puffs into the lungs every 6 (six) hours as needed for wheezing. 07/25/19   Thornell Mule, MD  pentoxifylline (TRENTAL) 400 MG CR tablet Take 400 mg by mouth 3 (three) times daily. 06/12/19   [provider]  torsemide (DEMADEX) 20 MG tablet Take 2 tablets (40 mg total) by mouth daily. 06/29/19   Samuella Cota, MD  traMADol (ULTRAM) 50 MG tablet Take 50 mg by mouth every 6 (six) hours as needed. 05/17/19   [provider]    Allergies Other    Social History Social History   Tobacco Use   Smoking status: Former    Pack years: 0.00   Smokeless tobacco: Never  Substance Use Topics   Alcohol use: Not Currently   Drug use: Never    Review of Systems Patient denies headaches, rhinorrhea, blurry vision, numbness, shortness of breath, chest pain, edema, cough, abdominal pain, nausea, vomiting, diarrhea, dysuria, fevers, rashes or hallucinations unless otherwise stated above in HPI. ____________________________________________   PHYSICAL EXAM:  VITAL SIGNS: Vitals:   12/15/20 0921 12/15/20 0930  BP: (!) 131/54 (!) 104/58  Pulse: (!) 56 (!) 56  Resp: 15 16  Temp:    SpO2: 98% 100%    Constitutional: Alert, drowys and ill appearing but protecting airway Eyes: Conjunctivae are normal.  Head: Atraumatic. Nose: No congestion/rhinnorhea. Mouth/Throat: Mucous membranes are moist.   Neck: No stridor. Painless ROM.  Cardiovascular: Normal rate, regular rhythm. Grossly normal heart sounds.  Good peripheral circulation. Respiratory: Normal respiratory effort.  No retractions. Lungs  CTAB. Gastrointestinal: Soft and nontender. No distention. No abdominal bruits. No CVA tenderness. Genitourinary:  Musculoskeletal: No lower extremity tenderness nor edema.  No joint effusions. Neurologic:  Normal speech and language. No gross focal neurologic deficits are appreciated. No facial droop Skin:  Skin is warm, dry and intact. No rash noted. Psychiatric: calm  ____________________________________________   LABS (all labs ordered are listed, but only abnormal results are displayed)  Results for orders placed or performed during the hospital encounter of 12/15/20 (from the past 24 hour(s))  Comprehensive metabolic panel     Status: Abnormal   Collection Time: 12/15/20  7:31 AM  Result Value Ref Range   Sodium 136 135 - 145 mmol/L   Potassium 5.0 3.5 - 5.1 mmol/L   Chloride 97 (L) 98 - 111 mmol/L   CO2 27 22 - 32 mmol/L   Glucose, Bld 158 (H) 70 - 99 mg/dL   BUN 35 (H) 8 - 23 mg/dL   Creatinine, Ser  4.65 (H) 0.61 - 1.24 mg/dL   Calcium 8.3 (L) 8.9 - 10.3 mg/dL   Total Protein 7.2 6.5 - 8.1 g/dL   Albumin 3.6 3.5 - 5.0 g/dL   AST 41 15 - 41 U/L   ALT 13 0 - 44 U/L   Alkaline Phosphatase 164 (H) 38 - 126 U/L   Total Bilirubin 1.2 0.3 - 1.2 mg/dL   GFR, Estimated 13 (L) >60 mL/min   Anion gap 12 5 - 15  CBC WITH DIFFERENTIAL     Status: Abnormal   Collection Time: 12/15/20  7:31 AM  Result Value Ref Range   WBC 13.2 (H) 4.0 - 10.5 K/uL   RBC 3.66 (L) 4.22 - 5.81 MIL/uL   Hemoglobin 12.2 (L) 13.0 - 17.0 g/dL   HCT 36.7 (L) 39.0 - 52.0 %   MCV 100.3 (H) 80.0 - 100.0 fL   MCH 33.3 26.0 - 34.0 pg   MCHC 33.2 30.0 - 36.0 g/dL   RDW 14.3 11.5 - 15.5 %   Platelets 203 150 - 400 K/uL   nRBC 0.0 0.0 - 0.2 %   Neutrophils Relative % 81 %   Neutro Abs 10.7 (H) 1.7 - 7.7 K/uL   Lymphocytes Relative 10 %   Lymphs Abs 1.4 0.7 - 4.0 K/uL   Monocytes Relative 6 %   Monocytes Absolute 0.8 0.1 - 1.0 K/uL   Eosinophils Relative 2 %   Eosinophils Absolute 0.3 0.0 - 0.5 K/uL    Basophils Relative 0 %   Basophils Absolute 0.0 0.0 - 0.1 K/uL   Immature Granulocytes 1 %   Abs Immature Granulocytes 0.08 (H) 0.00 - 0.07 K/uL  Lipase, blood     Status: None   Collection Time: 12/15/20  7:31 AM  Result Value Ref Range   Lipase 43 11 - 51 U/L  Resp Panel by RT-PCR (Flu A&B, Covid) Nasopharyngeal Swab     Status: None   Collection Time: 12/15/20  7:32 AM   Specimen: Nasopharyngeal Swab; Nasopharyngeal(NP) swabs in vial transport medium  Result Value Ref Range   SARS Coronavirus 2 by RT PCR NEGATIVE NEGATIVE   Influenza A by PCR NEGATIVE NEGATIVE   Influenza B by PCR NEGATIVE NEGATIVE  CBG monitoring, ED     Status: Abnormal   Collection Time: 12/15/20  7:32 AM  Result Value Ref Range   Glucose-Capillary 161 (H) 70 - 99 mg/dL  Lactic acid, plasma     Status: Abnormal   Collection Time: 12/15/20  8:25 AM  Result Value Ref Range   Lactic Acid, Venous 2.9 (HH) 0.5 - 1.9 mmol/L  Blood gas, venous     Status: Abnormal   Collection Time: 12/15/20  8:58 AM  Result Value Ref Range   pH, Ven 7.35 7.250 - 7.430   pCO2, Ven 49 44.0 - 60.0 mmHg   pO2, Ven 61.0 (H) 32.0 - 45.0 mmHg   Bicarbonate 27.1 20.0 - 28.0 mmol/L   Acid-Base Excess 0.9 0.0 - 2.0 mmol/L   O2 Saturation 89.7 %   Patient temperature 37.0    Collection site VEIN    Sample type VENOUS   POC CBG, ED     Status: Abnormal   Collection Time: 12/15/20  9:17 AM  Result Value Ref Range   Glucose-Capillary 215 (H) 70 - 99 mg/dL  Lactic acid, plasma     Status: None   Collection Time: 12/15/20  9:25 AM  Result Value Ref Range   Lactic Acid, Venous 1.6  0.5 - 1.9 mmol/L   ____________________________________________  EKG My review and personal interpretation at Time: 7:25   Indication: ams  Rate: 60  Rhythm: sinus Axis: left Other: diffuse st and t wave abnormality, no stemi criteria, prolonged qt ____________________________________________  RADIOLOGY  I personally reviewed all radiographic images  ordered to evaluate for the above acute complaints and reviewed radiology reports and findings.  These findings were personally discussed with the patient.  Please see medical record for radiology report.  ____________________________________________   PROCEDURES  Procedure(s) performed:  .Critical Care  Date/Time: 12/15/2020 10:24 AM Performed by: Merlyn Lot, MD Authorized by: Merlyn Lot, MD   Critical care provider statement:    Critical care time (minutes):  35   Critical care time was exclusive of:  Separately billable procedures and treating other patients   Critical care was necessary to treat or prevent imminent or life-threatening deterioration of the following conditions:  Sepsis   Critical care was time spent personally by me on the following activities:  Development of treatment plan with patient or surrogate, discussions with consultants, evaluation of patient's response to treatment, examination of patient, obtaining history from patient or surrogate, ordering and performing treatments and interventions, ordering and review of laboratory studies, ordering and review of radiographic studies, pulse oximetry, re-evaluation of patient's condition and review of old charts    Critical Care performed: no ____________________________________________   INITIAL IMPRESSION / ASSESSMENT AND PLAN / ED COURSE  Pertinent labs & imaging results that were available during my care of the patient were reviewed by me and considered in my medical decision making (see chart for details).   DDX: Dehydration, sepsis, pna, uti, hypoglycemia, cva, drug effect, withdrawal, encephalitis   Bryce Garcia is a 70 y.o. who presents to the ED with presentation as described above.  Patient arrives hypothermic ill-appearing but protecting his airway.  Dialysis patient with indwelling catheter.  Blood will be sent for the but differential.  He required arise requiring supplemental oxygen, is  not complaining of any chest pain or shortness of breath.  Clinical Course as of 12/15/20 1025  Mon Dec 15, 2020  0909 Son at bedside says that he was complaining of some abdominal pain yesterday.  Will order CT imaging.  Lactate is elevated therefore will give gentle IV hydration given concern for pulmonary edema as he is a dialysis patient with a history of CHF will give IV fluid resuscitation judiciously.  Patient and family agreeable to plan. [PR]  1024 CT abdomen is reassuring.  COVID-negative.  Repeat lactate improved.  Will discuss with hospitalist for admission. [PR]    Clinical Course User Index [PR] Merlyn Lot, MD    The patient was evaluated in Emergency Department today for the symptoms described in the history of present illness. He/she was evaluated in the context of the global COVID-19 pandemic, which necessitated consideration that the patient might be at risk for infection with the SARS-CoV-2 virus that causes COVID-19. Institutional protocols and algorithms that pertain to the evaluation of patients at risk for COVID-19 are in a state of rapid change based on information released by regulatory bodies including the CDC and federal and state organizations. These policies and algorithms were followed during the patient's care in the ED.  As part of my medical decision making, I reviewed the following data within the [facility] notes reviewed and incorporated, Labs reviewed, notes from prior ED visits and [place] Controlled Substance Database   ____________________________________________   FINAL  CLINICAL IMPRESSION(S) / ED DIAGNOSES  Final diagnoses:  Sepsis with encephalopathy without septic shock, due to unspecified organism [facility])      NEW MEDICATIONS STARTED DURING THIS VISIT:  New Prescriptions   No medications on file     Note:  This document was prepared using Dragon voice recognition software and may include unintentional dictation  errors.    Merlyn Lot, MD 12/15/20 1025

## 2020-12-15 NOTE — ED Notes (Signed)
Sent msg to Dr Blaine Hamper. Procalcitonin will be canceled because already drawn. Also asked to clarify order for duoneb and mucinex as pt is not SOB and does not have cough.

## 2020-12-15 NOTE — ED Notes (Signed)
Unable to obtain second IV access, IV team consult placed. Second nurse attempted 2 times.

## 2020-12-15 NOTE — ED Notes (Signed)
Lab personnel now at bedside top obtain blood cultures.

## 2020-12-15 NOTE — Progress Notes (Signed)
Following for code sepsis 

## 2020-12-15 NOTE — Consult Note (Signed)
Pharmacy Antibiotic Note  Bryce Garcia is a 70 y.o. male admitted on 12/15/2020 with pneumonia and sepsis.  Pharmacy has been consulted for Vancomycin and Cefepime dosing. Patient receives hemodialysis outpatient on a TTS schedule. Per Nephrologist, will continue outpatient schedule while admitted. Patient received total of Vancomycin '1500mg'$  as loading dose in ED.  Plan:  1) Vancomycin 1000 mg IV with each hemodialysis session, beginning tomorrow, 6/21.  Goal trough 10-15 mcg/mL.  2) Cefepime 1g Q24 hours  Height: '5\' 6"'$  (167.6 cm) Weight: 84.6 kg (186 lb 6.4 oz) IBW/kg (Calculated) : 63.8  Temp (24hrs), Avg:95.7 F (35.4 C), Min:93.7 F (34.3 C), Max:97.7 F (36.5 C)  Recent Labs  Lab 12/15/20 0731 12/15/20 0825 12/15/20 0925  WBC 13.2*  --   --   CREATININE 4.65*  --   --   LATICACIDVEN  --  2.9* 1.6    Estimated Creatinine Clearance: 15.5 mL/min (A) (by C-G formula based on SCr of 4.65 mg/dL (H)).    Allergies  Allergen Reactions   Other Other (See Comments)    Hypoglycemia unawareness Hypoglycemia unawareness     Antimicrobials this admission: Vancomycin 6/20 >>  Cefepime 6/20 >>   Microbiology results: 6/20 BCx: pending 6/20 Sputum: pending   Thank you for allowing pharmacy to be a part of this patient's care.  Pearla Dubonnet 12/15/2020 3:51 PM

## 2020-12-15 NOTE — ED Notes (Signed)
Called lab to come draw cultures and rest of labs. They are on way.

## 2020-12-15 NOTE — H&P (Signed)
History and Physical    Bryce Garcia DOB: 30-Nov-1950 DOA: 12/15/2020  Referring MD/NP/PA:   PCP: [place], [place]   Patient coming from:  The patient is coming from home.  At baseline, pt is independent for most of ADL.        Chief Complaint: Unresponsiveness  HPI: Bryce Garcia is a 70 y.o. male with medical history significant of ESRD-HD, HTN, HLD, DM, LBBB, dCHF, CAD, CABG, anemia, hypothyroidism, depression, who presents with unresponsiveness.  Per his son at the bedside, patient was found to be unresponsive at home at about 5:45 in the morning.  EMS reported patient had hypoglycemia with blood sugar of 50.  Patient was treated with D10, blood sugar improved.  His mental status has gradually improved.  When I saw patient in ED, his mental status has already come back to baseline.  Patient is alert and oriented x3. Patient also has dry cough, mild shortness breath, no chest pain.  Found to have oxygen desaturation to 89% on room air, which improved to 93-100% on 2 L oxygen. He is not using oxygen normally.  He states that he had some abdominal pain yesterday, which has resolved.  Currently no nausea, vomiting, diarrhea or abdominal pain.  No symptoms of UTI.  Patient states that he had a dialysis of 4 days ago.    ED Course: pt was found to have WBC 13.2, lactic acid 2.9, 1.6, negative COVID PCR, bicarbonate 27, potassium 5.0, creatinine 4.65, BUN 35.  Hypokalemia with temperature 93.7, blood pressure 118/53, heart rate 50s, RR 24, chest x-ray showed coarse bilateral interstitial patchy infiltration cardiomegaly.  CT of head is negative for acute intracranial abnormalities.  CT of abdomen/pelvis is negative for acute intra-abdominal issues.  Patient is admitted to progressive bed as inpatient.  Dr. Juleen China of renal is consulted.  Review of Systems:   General: no fevers, chills, no body weight gain, has fatigue HEENT: no blurry vision,  hearing changes or sore throat Respiratory: has mild dyspnea, coughing, no wheezing CV: no chest pain, no palpitations GI: no nausea, vomiting, abdominal pain, diarrhea, constipation GU: no dysuria, burning on urination, increased urinary frequency, hematuria  Ext: no leg edema Neuro: no unilateral weakness, numbness, or tingling, no vision change or hearing loss Skin: no rash, no skin tear. MSK: No muscle spasm, no deformity, no limitation of range of movement in spin Heme: No easy bruising.  Travel history: No recent long distant travel.  Allergy:  Allergies  Allergen Reactions   Other Other (See Comments)    Hypoglycemia unawareness Hypoglycemia unawareness     Past Medical History:  Diagnosis Date   CAD (coronary artery disease)    CHF (congestive heart failure) (HCC)    CKD (chronic kidney disease), stage III (HCC)    COPD (chronic obstructive pulmonary disease) ([place])    COVID-19    Diabetes mellitus without complication (HCC)    HFrEF (heart failure with reduced ejection fraction) (HCC)    LBBB (left bundle branch block)    Venous ulcer ([place])     History reviewed. No pertinent surgical history.  Social History:  reports that he has quit smoking. He has never used smokeless tobacco. He reports previous alcohol use. He reports that he does not use drugs.  Family History:  Family History  Problem Relation Age of Onset   Diabetes Mother    Hypertension Mother    Hyperlipidemia Mother    Cancer Father  Prior to Admission medications   Medication Sig Start Date End Date Taking? Authorizing Provider  albuterol (VENTOLIN HFA) 108 (90 Base) MCG/ACT inhaler Inhale 2 puffs into the lungs every 4 (four) hours as needed for wheezing. 05/12/18   [provider]  amLODipine (NORVASC) 10 MG tablet Take 0.5 tablets (5 mg total) by mouth daily. 07/25/19   Thornell Mule, MD  aspirin 81 MG EC tablet Take by mouth. 12/08/06   [provider]  atorvastatin  (LIPITOR) 80 MG tablet Take 80 mg by mouth daily. 04/30/19   [provider]  carvedilol (COREG) 25 MG tablet Take 25 mg by mouth 2 (two) times daily. 04/05/19   [provider]  cefdinir (OMNICEF) 300 MG capsule Take 1 capsule (300 mg total) by mouth daily. 07/25/19   Thornell Mule, MD  clopidogrel (PLAVIX) 75 MG tablet Take 75 mg by mouth daily. 04/30/19   [provider]  DULoxetine (CYMBALTA) 30 MG capsule Take 30 mg by mouth daily. 05/22/19   [provider]  EUTHYROX 100 MCG tablet Take 100 mcg by mouth daily. 03/18/19   [provider]  fluocinonide cream (LIDEX) [id number] % Apply 1 application topically 2 (two) times daily. For rash on legs. 10/24/13   [provider]  insulin NPH Human (NOVOLIN N) 100 UNIT/ML injection Inject 5 units sq qam and 19 units sq q hs, adjust as directed up to 30 units daily 04/17/18   [provider]  insulin regular (NOVOLIN R) 100 units/mL injection Inject 5-20 Units into the skin 3 (three) times daily before meals. Depending on blood glucose level 04/17/18   [provider]  ipratropium (ATROVENT HFA) 17 MCG/ACT inhaler Inhale 2 puffs into the lungs every 6 (six) hours as needed for wheezing. 07/25/19   Thornell Mule, MD  pentoxifylline (TRENTAL) 400 MG CR tablet Take 400 mg by mouth 3 (three) times daily. 06/12/19   [provider]  torsemide (DEMADEX) 20 MG tablet Take 2 tablets (40 mg total) by mouth daily. 06/29/19   Samuella Cota, MD  traMADol (ULTRAM) 50 MG tablet Take 50 mg by mouth every 6 (six) hours as needed. 05/17/19   [provider]    Physical Exam: Vitals:   12/15/20 1645 12/15/20 1650 12/15/20 1700 12/15/20 1715  BP: 125/62  (!) 144/58 (!) 150/53  Pulse: 64  64 63  Resp: '17  17 17  '$ Temp:  98.3 F (36.8 C)    TempSrc:  Oral    SpO2: 94%  95% 98%  Weight:      Height:       General: Not in acute distress HEENT:       Eyes: PERRL, EOMI, no scleral  icterus.       ENT: No discharge from the ears and nose, no pharynx injection, no tonsillar enlargement.        Neck: No JVD, no bruit, no mass felt. Heme: No neck lymph node enlargement. Cardiac: S1/S2, RRR, No murmurs, No gallops or rubs. Respiratory: Has fine crackles bilaterally GI: Soft, nondistended, nontender, no rebound pain, no organomegaly, BS present. GU: No hematuria Ext: No pitting leg edema bilaterally. 1+DP/PT pulse bilaterally. Musculoskeletal: No joint deformities, No joint redness or warmth, no limitation of ROM in spin. Skin: No rashes.  Neuro: Alert, oriented X3, cranial nerves II-XII grossly intact, moves all extremities normally.  Psych: Patient is not psychotic, no suicidal or hemocidal ideation.  Labs on Admission: I have personally reviewed following labs and imaging  studies  CBC: Recent Labs  Lab 12/15/20 0731  WBC 13.2*  NEUTROABS 10.7*  HGB 12.2*  HCT 36.7*  MCV 100.3*  PLT 123456   Basic Metabolic Panel: Recent Labs  Lab 12/15/20 0731  NA 136  K 5.0  CL 97*  CO2 27  GLUCOSE 158*  BUN 35*  CREATININE 4.65*  CALCIUM 8.3*   GFR: Estimated Creatinine Clearance: 15.5 mL/min (A) (by C-G formula based on SCr of 4.65 mg/dL (H)). Liver Function Tests: Recent Labs  Lab 12/15/20 0731  AST 41  ALT 13  ALKPHOS 164*  BILITOT 1.2  PROT 7.2  ALBUMIN 3.6   Recent Labs  Lab 12/15/20 0731  LIPASE 43   No results for input(s): AMMONIA in the last 168 hours. Coagulation Profile: No results for input(s): INR, PROTIME in the last 168 hours. Cardiac Enzymes: No results for input(s): CKTOTAL, CKMB, CKMBINDEX, TROPONINI in the last 168 hours. BNP (last 3 results) No results for input(s): PROBNP in the last 8760 hours. HbA1C: No results for input(s): HGBA1C in the last 72 hours. CBG: Recent Labs  Lab 12/15/20 0732 12/15/20 0917 12/15/20 1307 12/15/20 1513  GLUCAP 161* 215* 167* 298*   Lipid Profile: No results for input(s): CHOL, HDL,  LDLCALC, TRIG, CHOLHDL, LDLDIRECT in the last 72 hours. Thyroid Function Tests: No results for input(s): TSH, T4TOTAL, FREET4, T3FREE, THYROIDAB in the last 72 hours. Anemia Panel: No results for input(s): VITAMINB12, FOLATE, FERRITIN, TIBC, IRON, RETICCTPCT in the last 72 hours. Urine analysis:    Component Value Date/Time   COLORURINE Yellow 01/07/2014 1900   APPEARANCEUR Clear 01/07/2014 1900   LABSPEC 1.015 01/07/2014 1900   PHURINE 6.0 01/07/2014 1900   GLUCOSEU >=500 01/07/2014 1900   HGBUR Negative 01/07/2014 1900   BILIRUBINUR Negative 01/07/2014 1900   KETONESUR Negative 01/07/2014 1900   PROTEINUR >=500 01/07/2014 1900   NITRITE Negative 01/07/2014 1900   LEUKOCYTESUR Negative 01/07/2014 1900   Sepsis Labs: '@LABRCNTIP'$ (procalcitonin:4,lacticidven:4) ) Recent Results (from the past 240 hour(s))  Resp Panel by RT-PCR (Flu A&B, Covid) Nasopharyngeal Swab     Status: None   Collection Time: 12/15/20  7:32 AM   Specimen: Nasopharyngeal Swab; Nasopharyngeal(NP) swabs in vial transport medium  Result Value Ref Range Status   SARS Coronavirus 2 by RT PCR NEGATIVE NEGATIVE Final    Comment: (NOTE) SARS-CoV-2 target nucleic acids are NOT DETECTED.  The SARS-CoV-2 RNA is generally detectable in upper respiratory specimens during the acute phase of infection. The lowest concentration of SARS-CoV-2 viral copies this assay can detect is 138 copies/mL. A negative result does not preclude SARS-Cov-2 infection and should not be used as the sole basis for treatment or other patient management decisions. A negative result may occur with  improper specimen collection/handling, submission of specimen other than nasopharyngeal swab, presence of viral mutation(s) within the areas targeted by this assay, and inadequate number of viral copies(<138 copies/mL). A negative result must be combined with clinical observations, patient history, and epidemiological information. The expected result  is Negative.  Fact Sheet for Patients:  EntrepreneurPulse.com.au  Fact Sheet for Healthcare Providers:  IncredibleEmployment.be  This test is no t yet approved or cleared by the [place] FDA and  has been authorized for detection and/or diagnosis of SARS-CoV-2 by FDA under an Emergency Use Authorization (EUA). This EUA will remain  in effect (meaning this test can be used) for the duration of the COVID-19 declaration under Section 564(b)(1) of the Act, 21 U.S.C.section 360bbb-3(b)(1), unless the authorization is  terminated  or revoked sooner.       Influenza A by PCR NEGATIVE NEGATIVE Final   Influenza B by PCR NEGATIVE NEGATIVE Final    Comment: (NOTE) The Xpert Xpress SARS-CoV-2/FLU/RSV plus assay is intended as an aid in the diagnosis of influenza from Nasopharyngeal swab specimens and should not be used as a sole basis for treatment. Nasal washings and aspirates are unacceptable for Xpert Xpress SARS-CoV-2/FLU/RSV testing.  Fact Sheet for Patients: EntrepreneurPulse.com.au  Fact Sheet for Healthcare Providers: IncredibleEmployment.be  This test is not yet approved or cleared by the [place] FDA and has been authorized for detection and/or diagnosis of SARS-CoV-2 by FDA under an Emergency Use Authorization (EUA). This EUA will remain in effect (meaning this test can be used) for the duration of the COVID-19 declaration under Section 564(b)(1) of the Act, 21 U.S.C. section 360bbb-3(b)(1), unless the authorization is terminated or revoked.  Performed at [facility], [address] [place], [place] [address]   Culture, blood (single)     Status: None (Preliminary result)   Collection Time: 12/15/20  7:40 AM   Specimen: BLOOD  Result Value Ref Range Status   Specimen Description BLOOD LEFT ARM  Final   Special Requests   Final    BOTTLES DRAWN AEROBIC AND ANAEROBIC Blood  Culture adequate volume   Culture   Final    NO GROWTH < 12 HOURS Performed at [facility], [address], [place], [place]  [address]    Report Status PENDING  Incomplete  Blood culture (routine single)     Status: None (Preliminary result)   Collection Time: 12/15/20  8:25 AM   Specimen: BLOOD RIGHT HAND  Result Value Ref Range Status   Specimen Description BLOOD RIGHT HAND  Final   Special Requests   Final    BOTTLES DRAWN AEROBIC AND ANAEROBIC Blood Culture adequate volume   Culture   Final    NO GROWTH < 12 HOURS Performed at [facility], [address], [place], [place] [address]    Report Status PENDING  Incomplete  Culture, blood (routine x 2) Call MD if unable to obtain prior to antibiotics being given     Status: None (Preliminary result)   Collection Time: 12/15/20  3:16 PM   Specimen: BLOOD  Result Value Ref Range Status   Specimen Description BLOOD RIGHT ANTECUBITAL  Final   Special Requests   Final    BOTTLES DRAWN AEROBIC AND ANAEROBIC Blood Culture adequate volume   Culture   Final    NO GROWTH <12 HOURS Performed at [facility], [address] [place], [place] [address]    Report Status PENDING  Incomplete  Culture, blood (routine x 2) Call MD if unable to obtain prior to antibiotics being given     Status: None (Preliminary result)   Collection Time: 12/15/20  3:24 PM   Specimen: BLOOD  Result Value Ref Range Status   Specimen Description BLOOD BLOOD RIGHT HAND  Final   Special Requests   Final    BOTTLES DRAWN AEROBIC AND ANAEROBIC Blood Culture adequate volume   Culture   Final    NO GROWTH <12 HOURS Performed at [facility], [address], [place],  [address]    Report Status PENDING  Incomplete     Radiological Exams on Admission: CT ABDOMEN PELVIS WO CONTRAST  Result Date: 12/15/2020 CLINICAL DATA:  Found unresponsive with hypoglycemia. EXAM: CT ABDOMEN AND PELVIS WITHOUT CONTRAST TECHNIQUE:  Multidetector  CT imaging of the abdomen and pelvis was performed following the standard protocol without IV contrast. COMPARISON:  10/27/2013 CT angiogram of the chest, abdomen and pelvis. FINDINGS: Lower chest: Trace dependent bilateral pleural effusions, left greater than right, with mild hypoventilatory changes at the dependent lung bases. Coronary atherosclerosis. Tip of superior approach central venous catheter seen near the cavoatrial junction. Hepatobiliary: Normal liver size. No liver mass. Normal gallbladder with no radiopaque cholelithiasis. No biliary ductal dilatation. Pancreas: Normal, with no mass or duct dilation. Spleen: Normal size. No mass. Adrenals/Urinary Tract: Normal adrenals. No renal stones. No hydronephrosis. No contour deforming renal masses. Normal bladder. Stomach/Bowel: Normal non-distended stomach. Normal caliber small bowel with no small bowel wall thickening. Normal appendix. Mild left colonic diverticulosis with no large bowel wall thickening or significant pericolonic fat stranding. Vascular/Lymphatic: Atherosclerotic nonaneurysmal abdominal aorta. No pathologically enlarged lymph nodes in the abdomen or pelvis. Reproductive: Normal size prostate. Other: No pneumoperitoneum, ascites or focal fluid collection. Small fat containing right inguinal hernia. Musculoskeletal: No aggressive appearing focal osseous lesions. Moderate lumbar degenerative disc disease with large Schmorl's nodes at the superior and inferior L3 vertebral body. Intact lower sternotomy wires. IMPRESSION: 1. No acute abnormality. No evidence of bowel obstruction or acute bowel inflammation. Mild left colonic diverticulosis, with no evidence of acute diverticulitis. 2. Trace dependent bilateral pleural effusions, left greater than right. 3. Small fat containing right inguinal hernia. 4. Coronary atherosclerosis. 5. Aortic Atherosclerosis (ICD10-I70.0). Electronically Signed   By: Ilona Sorrel M.D.   On: 12/15/2020  10:16   CT HEAD WO CONTRAST  Result Date: 12/15/2020 CLINICAL DATA:  Mental status change, unknown cause. Altered mental status. EXAM: CT HEAD WITHOUT CONTRAST TECHNIQUE: Contiguous axial images were obtained from the base of the skull through the vertex without intravenous contrast. COMPARISON:  Prior head CT examination 08/08/2006. FINDINGS: Brain: Mild generalized cerebral and cerebellar atrophy. Chronic lacunar infarct within the left basal ganglia/internal capsule. Chronic small-vessel infarct along the lateral aspect of the right caudate nucleus. Small chronic infarct within the left cerebellar hemisphere. There is no acute intracranial hemorrhage. No demarcated cortical infarct. No extra-axial fluid collection. No evidence of intracranial mass. No midline shift. Vascular: No hyperdense vessel or unexpected calcification. Skull: No calvarial fracture or focal suspicious osseous lesion. Sinuses/Orbits: Visualized orbits show no acute finding. Moderate mucosal thickening within the right sphenoid sinus. Mild mucosal thickening within the inferior maxillary sinuses bilaterally. IMPRESSION: No evidence of acute intracranial abnormality. Chronic small-vessel infarcts within the left basal ganglia/internal capsule and within the right frontal white matter along the lateral aspect of the caudate nucleus. Small chronic infarct within the left cerebellar hemisphere. Mild generalized parenchymal atrophy. Electronically Signed   By: Kellie Simmering DO   On: 12/15/2020 13:21   DG Chest Port 1 View  Result Date: 12/15/2020 CLINICAL DATA:  70 year old male with possible sepsis. Found unresponsive. Hypoglycemia. EXAM: PORTABLE CHEST 1 VIEW COMPARISON:  Portable chest 07/22/2019 and earlier. FINDINGS: Portable AP upright view at 0740 hours. Right chest dual lumen dialysis type catheter. Mild cardiomegaly appears new or increased from last year. Prior CABG. Other mediastinal contours are within normal limits. Visualized  tracheal air column is within normal limits. Coarse bilateral pulmonary interstitial and patchy bilateral upper lobe pulmonary opacity which appears waxing and waning in both lungs since 2020. No pneumothorax or pleural effusion. No air bronchograms. Paucity of bowel gas in the upper abdomen. No acute osseous abnormality identified. Moderate to severe chronic changes of the right humeral head. IMPRESSION: Coarse  bilateral interstitial and patchy asymmetric pulmonary opacity waxing and waning in both lungs over this series of exams since 2020. Cardiomegaly with no definite pleural effusion. Top differential considerations are recurrent asymmetric pulmonary edema and bilateral pneumonia. Electronically Signed   By: Genevie Ann M.D.   On: 12/15/2020 07:59     EKG: Not done in ED, will get one.   Assessment/Plan Principal Problem:   HCAP (healthcare-associated pneumonia) Active Problems:   COPD with chronic bronchitis (HCC)   Chronic diastolic CHF (congestive heart failure) (HCC)   Type II diabetes mellitus with renal manifestations (HCC)   Acute metabolic encephalopathy   Hypoglycemia   Acute on chronic respiratory failure with hypoxia (HCC)   Sepsis (HCC)   HLD (hyperlipidemia)   HTN (hypertension)   Hypothyroidism   Depression   CAD (coronary artery disease)   ESRD (end stage renal disease) on dialysis (HCC)   Hypothermia   Acute on chronic respiratory failure with hypoxia  and sepsis due to  possible HCAP (healthcare-associated pneumonia): Patient meets criteria for sepsis with leukocytosis with WBC 13.2, tachypnea with RR 24 and hypothermia.  Lactic acid is elevated 2.9, 1.6.  Chest x-ray showed bilateral interstitial patchy infiltration, indicating possible pneumonia.   -Admitted to progressive unit as inpatient - IV Vancomycin and cefepime - Mucinex for cough  - Bronchodilators - Urine legionella and S. pneumococcal antigen - Follow up blood culture x2, sputum culture  - will get  Procalcitonin and trend lactic acid level per sepsis protocol - IVF: 500 mL of NS bolus in ED (patient has ESRD, limiting aggressive IV fluids treatment)  COPD with chronic bronchitis ([place]): -Bronchodilators  Chronic diastolic CHF (congestive heart failure) ([place]): 2D echo on 06/21/2019 showed EF of 50-55% with grade 2 diastolic dysfunction. -Volume management per renal by dialysis  Hypoglycemia: Blood sugar down to 50s. -Hold insulin -Check CBG every 2 hours -D50 as needed  Type II diabetes mellitus with renal manifestations ([place]): Recent A1c 6.8 on 06/20/2020.  Patient is taking Novolin and NPH insulin.  Now has hypoglycemia. -Hold NPH insulin -Will start sliding scale insulin when blood sugar improves.  Acute metabolic encephalopathy and unresponsiveness: Most likely due to hypoglycemia.  CT head negative.  Mental status improved along with improvement of blood sugar level -Frequent neuro checks -Treat hypoglycemia as above  HLD (hyperlipidemia) -Lipitor  HTN (hypertension) -IV hydralazine as needed -Coreg -Hold amlodipine due to sepsis and high risk of developing hypotension  Hypothyroidism -Euthyrox  Depression -Continue home medications  CAD (coronary artery disease) -Plavix, Lipitor  ESRD (end stage renal disease) on dialysis (TTS) -Dr. Juleen China is consulted for dialysis  Hypothermia: Due to hypoglycemia -Bair hugger         DVT ppx: SQ Heparin          Code Status: Full code Family Communication:  Yes, patient's  son  at bed side Disposition Plan:  Anticipate discharge back to previous environment Consults called:  Dr. Juleen China of renal Admission status and Level of care: Progressive Cardiac:     as inpt     Status is: Inpatient  Remains inpatient appropriate because:Inpatient level of care appropriate due to severity of illness  Dispo: The patient is from: Home              Anticipated d/c is to: Home              Patient currently is not  medically stable to d/c.   Difficult to place patient No  Date of Service 12/15/2020    Ivor Costa [facility]   If 7PM-7AM, please contact night-coverage [email or web address] 12/15/2020, 5:52 PM

## 2020-12-15 NOTE — ED Triage Notes (Addendum)
Pt to ED from home via [facility] called EMS when found pt unresponsive in bed; glucose monitor on pt reading "low" since midnight. Pt given IV D10, orange juice, pedialyte, CBG then 109, then 189.  Pt has been altered and is c/o being cold. EMS unable to obtain temp. Pt was 89% on RA, put on 4L by EMS. Other VS per EMS were wnl.  Pt is responding appropriately to orientation questions.  CBG here is 161. Pt has dialysis port in R chest. Last dialysis was 4d ago.  Rectal T found to be 93.7. Pt covered with warm blankets and bear hugger will be applied.

## 2020-12-15 NOTE — ED Notes (Signed)
CBG 215.

## 2020-12-15 NOTE — Progress Notes (Addendum)
PHARMACY -  BRIEF ANTIBIOTIC NOTE   Pharmacy has received consult(s) for vancomycin and cefepime from an ED provider.  The patient's profile has been reviewed for ht/wt/allergies/indication/available labs.    One time order(s) placed for cefepime 2g and vancomycin 1g. Will order an additional vancomycin '500mg'$  for a total for '1500mg'$  loading dose.   Further antibiotics/pharmacy consults should be ordered by admitting physician if indicated.                       Thank you, Pernell Dupre, PharmD, BCPS Clinical Pharmacist 12/15/2020 8:12 AM

## 2020-12-15 NOTE — ED Notes (Signed)
First set of blood cultures obtained by lab. IV team nurse at bedside now to obtain second set of cultures and second IV line, then will start cefepime.

## 2020-12-15 NOTE — Progress Notes (Signed)
[facility]  ROUNDING NOTE   Subjective:   [name] was admitted to [facility] on 12/15/2020 for CAP (community acquired pneumonia) [J18.9] HCAP (healthcare-associated pneumonia) [J18.9]  Last hemodialysis treatment was 6/18  Patient was found unresponsive and short of breath. Now found to have sepsis  Son at bedside.    Objective:  Vital signs in last 24 hours:  Temp:  [93.7 F (34.3 C)] 93.7 F (34.3 C) (06/20 0732) Pulse Rate:  [52-62] 62 (06/20 1200) Resp:  [14-24] 16 (06/20 1200) BP: (104-147)/(53-68) 126/56 (06/20 1200) SpO2:  [93 %-100 %] 99 % (06/20 1200) Weight:  [84.6 kg] 84.6 kg (06/20 0725)  Weight change:  Filed Weights   12/15/20 0725  Weight: 84.6 kg    Intake/Output: No intake/output data recorded.   Intake/Output this shift:  Total I/O In: 800 [IV Piggyback:800] Out: -   Physical Exam: General: NAD, sitting up in stretcher  Head: Normocephalic, atraumatic. Moist oral mucosal membranes  Eyes: Anicteric, PERRL  Neck: Supple, trachea midline  Lungs:  Clear to auscultation  Heart: Regular rate and rhythm  Abdomen:  Soft, nontender,   Extremities:  no peripheral edema.  Neurologic: Nonfocal, moving all four extremities  Skin: No lesions  Access: RIJ permcath    Basic Metabolic Panel: Recent Labs  Lab 12/15/20 0731  NA 136  K 5.0  CL 97*  CO2 27  GLUCOSE 158*  BUN 35*  CREATININE 4.65*  CALCIUM 8.3*    Liver Function Tests: Recent Labs  Lab 12/15/20 0731  AST 41  ALT 13  ALKPHOS 164*  BILITOT 1.2  PROT 7.2  ALBUMIN 3.6   Recent Labs  Lab 12/15/20 0731  LIPASE 43   No results for input(s): AMMONIA in the last 168 hours.  CBC: Recent Labs  Lab 12/15/20 0731  WBC 13.2*  NEUTROABS 10.7*  HGB 12.2*  HCT 36.7*  MCV 100.3*  PLT 203    Cardiac Enzymes: No results for input(s): CKTOTAL, CKMB, CKMBINDEX, TROPONINI in the last 168 hours.  BNP: Invalid input(s): POCBNP  CBG: Recent Labs  Lab  12/15/20 0732 12/15/20 0917  GLUCAP 161* 215*    Microbiology: Results for orders placed or performed during the hospital encounter of 12/15/20  Resp Panel by RT-PCR (Flu A&B, Covid) Nasopharyngeal Swab     Status: None   Collection Time: 12/15/20  7:32 AM   Specimen: Nasopharyngeal Swab; Nasopharyngeal(NP) swabs in vial transport medium  Result Value Ref Range Status   SARS Coronavirus 2 by RT PCR NEGATIVE NEGATIVE Final    Comment: (NOTE) SARS-CoV-2 target nucleic acids are NOT DETECTED.  The SARS-CoV-2 RNA is generally detectable in upper respiratory specimens during the acute phase of infection. The lowest concentration of SARS-CoV-2 viral copies this assay can detect is 138 copies/mL. A negative result does not preclude SARS-Cov-2 infection and should not be used as the sole basis for treatment or other patient management decisions. A negative result may occur with  improper specimen collection/handling, submission of specimen other than nasopharyngeal swab, presence of viral mutation(s) within the areas targeted by this assay, and inadequate number of viral copies(<138 copies/mL). A negative result must be combined with clinical observations, patient history, and epidemiological information. The expected result is Negative.  Fact Sheet for Patients:  EntrepreneurPulse.com.au  Fact Sheet for Healthcare Providers:  IncredibleEmployment.be  This test is no t yet approved or cleared by the [place] FDA and  has been authorized for detection and/or diagnosis of SARS-CoV-2 by  FDA under an Emergency Use Authorization (EUA). This EUA will remain  in effect (meaning this test can be used) for the duration of the COVID-19 declaration under Section 564(b)(1) of the Act, 21 U.S.C.section 360bbb-3(b)(1), unless the authorization is terminated  or revoked sooner.       Influenza A by PCR NEGATIVE NEGATIVE Final   Influenza B by PCR  NEGATIVE NEGATIVE Final    Comment: (NOTE) The Xpert Xpress SARS-CoV-2/FLU/RSV plus assay is intended as an aid in the diagnosis of influenza from Nasopharyngeal swab specimens and should not be used as a sole basis for treatment. Nasal washings and aspirates are unacceptable for Xpert Xpress SARS-CoV-2/FLU/RSV testing.  Fact Sheet for Patients: EntrepreneurPulse.com.au  Fact Sheet for Healthcare Providers: IncredibleEmployment.be  This test is not yet approved or cleared by the [place] FDA and has been authorized for detection and/or diagnosis of SARS-CoV-2 by FDA under an Emergency Use Authorization (EUA). This EUA will remain in effect (meaning this test can be used) for the duration of the COVID-19 declaration under Section 564(b)(1) of the Act, 21 U.S.C. section 360bbb-3(b)(1), unless the authorization is terminated or revoked.  Performed at [facility], [address] [place], [place] [address]     Coagulation Studies: No results for input(s): LABPROT, INR in the last 72 hours.  Urinalysis: No results for input(s): COLORURINE, LABSPEC, PHURINE, GLUCOSEU, HGBUR, BILIRUBINUR, KETONESUR, PROTEINUR, UROBILINOGEN, NITRITE, LEUKOCYTESUR in the last 72 hours.  Invalid input(s): APPERANCEUR    Imaging: CT ABDOMEN PELVIS WO CONTRAST  Result Date: 12/15/2020 CLINICAL DATA:  Found unresponsive with hypoglycemia. EXAM: CT ABDOMEN AND PELVIS WITHOUT CONTRAST TECHNIQUE: Multidetector CT imaging of the abdomen and pelvis was performed following the standard protocol without IV contrast. COMPARISON:  10/27/2013 CT angiogram of the chest, abdomen and pelvis. FINDINGS: Lower chest: Trace dependent bilateral pleural effusions, left greater than right, with mild hypoventilatory changes at the dependent lung bases. Coronary atherosclerosis. Tip of superior approach central venous catheter seen near the cavoatrial junction. Hepatobiliary:  Normal liver size. No liver mass. Normal gallbladder with no radiopaque cholelithiasis. No biliary ductal dilatation. Pancreas: Normal, with no mass or duct dilation. Spleen: Normal size. No mass. Adrenals/Urinary Tract: Normal adrenals. No renal stones. No hydronephrosis. No contour deforming renal masses. Normal bladder. Stomach/Bowel: Normal non-distended stomach. Normal caliber small bowel with no small bowel wall thickening. Normal appendix. Mild left colonic diverticulosis with no large bowel wall thickening or significant pericolonic fat stranding. Vascular/Lymphatic: Atherosclerotic nonaneurysmal abdominal aorta. No pathologically enlarged lymph nodes in the abdomen or pelvis. Reproductive: Normal size prostate. Other: No pneumoperitoneum, ascites or focal fluid collection. Small fat containing right inguinal hernia. Musculoskeletal: No aggressive appearing focal osseous lesions. Moderate lumbar degenerative disc disease with large Schmorl's nodes at the superior and inferior L3 vertebral body. Intact lower sternotomy wires. IMPRESSION: 1. No acute abnormality. No evidence of bowel obstruction or acute bowel inflammation. Mild left colonic diverticulosis, with no evidence of acute diverticulitis. 2. Trace dependent bilateral pleural effusions, left greater than right. 3. Small fat containing right inguinal hernia. 4. Coronary atherosclerosis. 5. Aortic Atherosclerosis (ICD10-I70.0). Electronically Signed   By: Ilona Sorrel M.D.   On: 12/15/2020 10:16   DG Chest Port 1 View  Result Date: 12/15/2020 CLINICAL DATA:  70 year old male with possible sepsis. Found unresponsive. Hypoglycemia. EXAM: PORTABLE CHEST 1 VIEW COMPARISON:  Portable chest 07/22/2019 and earlier. FINDINGS: Portable AP upright view at 0740 hours. Right chest dual lumen dialysis type catheter. Mild cardiomegaly appears new or increased from  last year. Prior CABG. Other mediastinal contours are within normal limits. Visualized tracheal air  column is within normal limits. Coarse bilateral pulmonary interstitial and patchy bilateral upper lobe pulmonary opacity which appears waxing and waning in both lungs since 2020. No pneumothorax or pleural effusion. No air bronchograms. Paucity of bowel gas in the upper abdomen. No acute osseous abnormality identified. Moderate to severe chronic changes of the right humeral head. IMPRESSION: Coarse bilateral interstitial and patchy asymmetric pulmonary opacity waxing and waning in both lungs over this series of exams since 2020. Cardiomegaly with no definite pleural effusion. Top differential considerations are recurrent asymmetric pulmonary edema and bilateral pneumonia. Electronically Signed   By: Genevie Ann M.D.   On: 12/15/2020 07:59     Medications:     dextromethorphan-guaiFENesin  1 tablet Oral BID   ipratropium-albuterol  3 mL Nebulization Q4H   acetaminophen, albuterol, dextrose, hydrALAZINE, ondansetron (ZOFRAN) IV  Assessment/ Plan:  Bryce Garcia is a 70 y.o.  male with end stage renal disease on hemodialysis, hypertension, diabetes mellitus type II insulin dependent, coronary artery disease, congestive heart failure, COPD, hyperlipidemia, hypothyroidism, depression who is admitted to [facility] on 12/15/2020 for CAP (community acquired pneumonia) [J18.9] HCAP (healthcare-associated pneumonia) [J18.9]  [facility] TTS [facility] [address] RIJ permcath 81kg  End Stage Renal Disease: on TTS schedule. Dialysis for tomorrow. After dialysis, will consider removing dialysis catheter for a catheter free period and send catheter tip for culture.   2. Hypertension: home regimen of enalapril and torsemide.   3. Diabetes mellitus type II with chronic kidney disease: insulin dependent. Hemoglobin A1c of 7.5% on 11/27/20.   4. Anemia with chronic kidney disease: macrocytic. Hemoglobin 12.2. Mircera as outpatient.   5. Secondary Hyperparathyroidism: with hyperphosphatemia and hypocalcemia. PTH  1160, phos 6.1 and calcium 7.8.  - sevelamer with meals.   6. Sepsis: on broad spectrum antibiotics.    LOS: 0 Cloyce Blankenhorn 6/20/202212:52 PM

## 2020-12-15 NOTE — ED Notes (Signed)
EDP at bedside.  O2 changed to 2L.

## 2020-12-15 NOTE — ED Notes (Signed)
Will have to start IV abx without/before blood cultures being drawn. Discussed with Dr Quentin Cornwall. Two nurses were unable to obtain blood cultures or rest of labs and pt will need to receive IV abx. Lab not here yet for blood draw.

## 2020-12-15 NOTE — ED Notes (Signed)
Informed Dr Blaine Hamper of CBG trends, last one 355.

## 2020-12-15 NOTE — Progress Notes (Signed)
CODE SEPSIS - PHARMACY COMMUNICATION  **Broad Spectrum Antibiotics should be administered within 1 hour of Sepsis diagnosis**  Time Code Sepsis Called/Page Received: [id number]  Antibiotics Ordered: Cefepime, vancomycin, flagyl   Time of 1st antibiotic administration: [place], PharmD, BCPS Clinical Pharmacist 12/15/2020 8:58 AM

## 2020-12-15 NOTE — ED Notes (Signed)
Normal saline infusion was stopped per verbal order from nephrologist. Dr Blaine Hamper informed.

## 2020-12-15 NOTE — ED Notes (Signed)
Pyxis being reloaded with insulin aspart by pharmacy tech.  Unable to obtain insulin because pyxis was out.

## 2020-12-15 NOTE — ED Notes (Signed)
Pt to CT

## 2020-12-15 NOTE — ED Notes (Signed)
IV team nurse obtaining second set cultures.

## 2020-12-15 NOTE — ED Notes (Signed)
See new orders for SSI.

## 2020-12-15 NOTE — ED Notes (Signed)
Second nurse attempting to draw labs.  Bear hugger applied; EDP informed of 93.7 rectal T.

## 2020-12-15 NOTE — ED Notes (Signed)
Son at bedside. Pt sleeping.

## 2020-12-15 NOTE — ED Notes (Signed)
CBG 167 

## 2020-12-15 NOTE — ED Notes (Signed)
Pharmacy called; will send down second part of vanc dose: '500mg'$  bag.

## 2020-12-16 DIAGNOSIS — J189 Pneumonia, unspecified organism: Secondary | ICD-10-CM | POA: Diagnosis not present

## 2020-12-16 DIAGNOSIS — E1122 Type 2 diabetes mellitus with diabetic chronic kidney disease: Secondary | ICD-10-CM | POA: Diagnosis not present

## 2020-12-16 DIAGNOSIS — E162 Hypoglycemia, unspecified: Secondary | ICD-10-CM | POA: Diagnosis present

## 2020-12-16 DIAGNOSIS — I120 Hypertensive chronic kidney disease with stage 5 chronic kidney disease or end stage renal disease: Secondary | ICD-10-CM | POA: Diagnosis not present

## 2020-12-16 DIAGNOSIS — N2581 Secondary hyperparathyroidism of renal origin: Secondary | ICD-10-CM | POA: Diagnosis not present

## 2020-12-16 DIAGNOSIS — N186 End stage renal disease: Secondary | ICD-10-CM | POA: Diagnosis not present

## 2020-12-16 DIAGNOSIS — G9341 Metabolic encephalopathy: Secondary | ICD-10-CM | POA: Diagnosis present

## 2020-12-16 LAB — CBC
HCT: 31.4 % — ABNORMAL LOW (ref 39.0–52.0)
Hemoglobin: 10.2 g/dL — ABNORMAL LOW (ref 13.0–17.0)
MCH: 32.5 pg (ref 26.0–34.0)
MCHC: 32.5 g/dL (ref 30.0–36.0)
MCV: 100 fL (ref 80.0–100.0)
Platelets: 175 10*3/uL (ref 150–400)
RBC: 3.14 MIL/uL — ABNORMAL LOW (ref 4.22–5.81)
RDW: 14.2 % (ref 11.5–15.5)
WBC: 7.1 10*3/uL (ref 4.0–10.5)
nRBC: 0 % (ref 0.0–0.2)

## 2020-12-16 LAB — BASIC METABOLIC PANEL
Anion gap: 8 (ref 5–15)
BUN: 47 mg/dL — ABNORMAL HIGH (ref 8–23)
CO2: 25 mmol/L (ref 22–32)
Calcium: 8.6 mg/dL — ABNORMAL LOW (ref 8.9–10.3)
Chloride: 100 mmol/L (ref 98–111)
Creatinine, Ser: 5.4 mg/dL — ABNORMAL HIGH (ref 0.61–1.24)
GFR, Estimated: 11 mL/min — ABNORMAL LOW (ref >60)
Glucose, Bld: 309 mg/dL — ABNORMAL HIGH (ref 70–99)
Potassium: 5.2 mmol/L — ABNORMAL HIGH (ref 3.5–5.1)
Sodium: 133 mmol/L — ABNORMAL LOW (ref 135–145)

## 2020-12-16 LAB — STREP PNEUMONIAE URINARY ANTIGEN: Strep Pneumo Urinary Antigen: NEGATIVE

## 2020-12-16 LAB — MRSA PCR SCREENING: MRSA by PCR: NEGATIVE

## 2020-12-16 LAB — GLUCOSE, CAPILLARY
Glucose-Capillary: 284 mg/dL — ABNORMAL HIGH (ref 70–99)
Glucose-Capillary: 237 mg/dL — ABNORMAL HIGH (ref 70–99)

## 2020-12-16 LAB — PROCALCITONIN: Procalcitonin: 0.2 ng/mL

## 2020-12-16 LAB — CBG MONITORING, ED
Glucose-Capillary: 298 mg/dL — ABNORMAL HIGH (ref 70–99)
Glucose-Capillary: 282 mg/dL — ABNORMAL HIGH (ref 70–99)

## 2020-12-16 MED ORDER — AMOXICILLIN-POT CLAVULANATE 500-125 MG PO TABS
1.0000 | ORAL_TABLET | Freq: Every day | ORAL | Status: DC
  Filled 2020-12-16: qty 1

## 2020-12-16 MED ORDER — CHLORHEXIDINE GLUCONATE CLOTH 2 % EX PADS
6.0000 | MEDICATED_PAD | Freq: Every day | CUTANEOUS | Status: DC
  Administered 2020-12-16: 6 via TOPICAL
  Filled 2020-12-16: qty 6

## 2020-12-16 MED ORDER — IPRATROPIUM-ALBUTEROL 0.5-2.5 (3) MG/3ML IN SOLN
3.0000 mL | Freq: Three times a day (TID) | RESPIRATORY_TRACT | Status: DC
  Administered 2020-12-16: 3 mL via RESPIRATORY_TRACT
  Filled 2020-12-16 (×2): qty 3

## 2020-12-16 NOTE — Care Management Obs Status (Signed)
[place] NOTIFICATION   Patient Details  Name: Bryce Garcia MRN: [id number] Date of Birth: 1951/06/15   Medicare Observation Status Notification Given:  Yes    Kerin Salen, RN 12/16/2020, 3:16 PM

## 2020-12-16 NOTE — ED Notes (Signed)
Pt sitting in recliner at this time with out any distress noted, will continue to monitor.

## 2020-12-16 NOTE — Progress Notes (Signed)
Inpatient Diabetes Program Recommendations  AACE/ADA: New Consensus Statement on Inpatient Glycemic Control   Target Ranges:  Prepandial:   less than 140 mg/dL      Peak postprandial:   less than 180 mg/dL (1-2 hours)      Critically ill patients:  140 - 180 mg/dL   Results for DIANNA, BREHM (MRN [id number]) as of 12/16/2020 09:38  Ref. Range 12/15/2020 07:32 12/15/2020 09:17 12/15/2020 13:07 12/15/2020 15:13 12/15/2020 18:40 12/15/2020 21:48 12/15/2020 23:40 12/16/2020 04:18 12/16/2020 07:34  Glucose-Capillary Latest Ref Range: 70 - 99 mg/dL 161 (H) 215 (H) 167 (H) 298 (H) 355 (H) 421 (H) 418 (H) 298 (H) 282 (H)    Review of Glycemic Control  Diabetes history: DM2 Outpatient Diabetes medications: NPH 5 units QAM, NPH 19 units QHS, Regular 5-20 units TID with meals Current orders for Inpatient glycemic control: Novolog 0-9 units TID with meals, Novolog 0-5 units QHS  Inpatient Diabetes Program Recommendations:    Insulin: Per chart, patient was initially hypoglycemic and CBGs up to 421 mg/dl last night. Fasting glucose 282 mg/dl. Please consider ordering Lantus 15 units Q24H and Novolog 3 units TID with meals for meal coverage if patient eats at least 50% of meals.  Thanks, Barnie Alderman, RN, MSN, CDE Diabetes Coordinator Inpatient Diabetes Program [phone] (Team Pager from 8am to 5pm)

## 2020-12-16 NOTE — ED Notes (Signed)
No change in condition, will continue to monitor.  

## 2020-12-16 NOTE — Progress Notes (Signed)
Hemodialysis patient known at [facility] TTS 11:00am, patient normally drives self. Patient stated no concerns. Please contact me with any dialysis placement concerns.  Elvera Bicker Dialysis Coordinator [phone]

## 2020-12-16 NOTE — ED Notes (Signed)
Stephaine RN aware of assigned bed

## 2020-12-16 NOTE — Care Management CC44 (Signed)
Condition Code 44 Documentation Completed  Patient Details  Name: WILKIE PACKWOOD MRN: [id number] Date of Birth: 06/16/1951   Condition Code 44 given:  Yes Patient signature on Condition Code 44 notice:  Yes (Patient given information verbally consented to me signing because he is on Dialysis machine.) Documentation of 2 MD's agreement:  Yes Code 44 added to claim:  Yes    Kerin Salen, RN 12/16/2020, 3:16 PM

## 2020-12-16 NOTE — Discharge Summary (Signed)
Physician Discharge Summary  Bryce Garcia [id number] DOB: [birth year] DOA: 12/15/2020  PCP: [facility] date: 12/15/2020 Discharge date: 12/16/2020  Admitted From: Home Disposition: Home  Recommendations for Outpatient Follow-up:  Follow up with PCP in 1-2 weeks Continue with routine dialysis Please obtain BMP/CBC in one week Please follow up on the following pending results: Final blood culture results.  Home Health: None no Equipment/Devices: None Discharge Condition: Stable CODE STATUS: Full Diet recommendation: Heart Healthy / Carb Modified   Brief/Interim Summary: Bryce Garcia is a 70 y.o. male with medical history significant of ESRD-HD, HTN, HLD, DM, LBBB, dCHF, CAD, CABG, anemia, hypothyroidism, depression, who presents with unresponsiveness.   Per his son at the bedside, patient was found to be unresponsive at home at about 5:45 in the morning.  EMS reported patient had hypoglycemia with blood sugar of 50.  Patient was treated with D10, blood sugar improved.  His mental status has gradually improved.  Blood glucose level remained stable.  Per patient he does developed episodes of hypoglycemia easily despite staying on a dose of insulin which works most of the time.  He was advised to follow-up very closely with primary care provider to adjust his insulin and hypoglycemia can be more devastating than mild hyperglycemia.  On presentation he had very mild hypoxia requiring 2 L of oxygen.  Which was weaned off later on.  He also had mild leukocytosis and lactic acidosis which resolved pretty quickly with fluid. Chest x-ray per concerning for bilateral infiltrates which can be pulmonary edema or atypical pneumonia.  Patient does not has any upper respiratory symptoms, remained afebrile.  Procalcitonin negative.  Patient received initially HCAP coverage with cefepime and vancomycin.  MRSA PCR negative.  Doubted he has pneumonia so  antibiotics were discontinued. Patient also had mild hyperkalemia and underwent his routine dialysis before discharge.  Per patient he is compliance with his dialysis and he will continue with that. CT abdomen was without any significant abnormality.  CT head was negative for any acute intracranial abnormalities.  Patient has an history of COPD, no acute concern and he will continue his home bronchodilators.  Patient also has an history of chronic diastolic heart failure, 2D echo done on 06/21/2019 showed EF of 50-55% with grade 2 diastolic dysfunction.  Volume is being managed by dialysis.  Patient will continue with his home medications and follow-up with his providers.  Discharge Diagnoses:  Principal Problem:   HCAP (healthcare-associated pneumonia) Active Problems:   COPD with chronic bronchitis (HCC)   Chronic diastolic CHF (congestive heart failure) (HCC)   Type II diabetes mellitus with renal manifestations (HCC)   Acute metabolic encephalopathy   Hypoglycemia   Acute on chronic respiratory failure with hypoxia (HCC)   Sepsis (HCC)   HLD (hyperlipidemia)   HTN (hypertension)   Hypothyroidism   Depression   CAD (coronary artery disease)   ESRD (end stage renal disease) on dialysis ([facility])   Hypothermia   Acute metabolic encephalopathy due to hypoglycemia    Discharge Instructions  Discharge Instructions     Diet - low sodium heart healthy   Complete by: As directed    Discharge instructions   Complete by: As directed    It was pleasure taking care of you. Please keep checking your blood glucose level 3-4 times daily and adjust your insulin dosage accordingly, you might need little lesser dose of insulin as dropping your blood glucose to that level can be  more dangerous. Please follow-up very closely with your primary care provider for your dose adjustment and further management of your diabetes.   Discharge wound care:   Complete by: As directed    Venous stasis  ulcer Leg Left;Lower;Anterior open area on left shin with xeroform, ABD pad, ace wrap.   Increase activity slowly   Complete by: As directed       Allergies as of 12/16/2020       Reactions   Other Other (See Comments)   Hypoglycemia unawareness Hypoglycemia unawareness        Medication List     STOP taking these medications    amLODipine 10 MG tablet Commonly known as: NORVASC   cefdinir 300 MG capsule Commonly known as: OMNICEF       TAKE these medications    albuterol 108 (90 Base) MCG/ACT inhaler Commonly known as: VENTOLIN HFA Inhale 2 puffs into the lungs every 4 (four) hours as needed for wheezing.   aspirin 81 MG EC tablet Take by mouth.   atorvastatin 80 MG tablet Commonly known as: LIPITOR Take 80 mg by mouth daily.   carvedilol 25 MG tablet Commonly known as: COREG Take 25 mg by mouth 2 (two) times daily.   clopidogrel 75 MG tablet Commonly known as: PLAVIX Take 75 mg by mouth daily.   DULoxetine 30 MG capsule Commonly known as: CYMBALTA Take 30 mg by mouth daily.   Euthyrox 100 MCG tablet Generic drug: levothyroxine Take 100 mcg by mouth daily.   fluocinonide cream 0.05 % Commonly known as: LIDEX Apply 1 application topically 2 (two) times daily. For rash on legs.   insulin NPH Human 100 UNIT/ML injection Commonly known as: NOVOLIN N Inject 5 units sq qam and 19 units sq q hs, adjust as directed up to 30 units daily   insulin regular 100 units/mL injection Commonly known as: NOVOLIN R Inject 5-20 Units into the skin 3 (three) times daily before meals. Depending on blood glucose level   ipratropium 17 MCG/ACT inhaler Commonly known as: ATROVENT HFA Inhale 2 puffs into the lungs every 6 (six) hours as needed for wheezing.   pentoxifylline 400 MG CR tablet Commonly known as: TRENTAL Take 400 mg by mouth 3 (three) times daily.   torsemide 20 MG tablet Commonly known as: DEMADEX Take 2 tablets (40 mg total) by mouth daily.    traMADol 50 MG tablet Commonly known as: ULTRAM Take 50 mg by mouth every 6 (six) hours as needed.               Discharge Care Instructions  (From admission, onward)           Start     Ordered   12/16/20 0000  Discharge wound care:       Comments: Venous stasis ulcer Leg Left;Lower;Anterior open area on left shin with xeroform, ABD pad, ace wrap.   12/16/20 1517            Follow-up Information     [facility], [place]. Schedule an appointment as soon as possible for a visit in 1 week(s).   Contact information: [place] [phone]                Allergies  Allergen Reactions   Other Other (See Comments)    Hypoglycemia unawareness Hypoglycemia unawareness     Consultations: Nephrology  Procedures/Studies: CT ABDOMEN PELVIS WO CONTRAST  Result Date: 12/15/2020 CLINICAL  DATA:  Found unresponsive with hypoglycemia. EXAM: CT ABDOMEN AND PELVIS WITHOUT CONTRAST TECHNIQUE: Multidetector CT imaging of the abdomen and pelvis was performed following the standard protocol without IV contrast. COMPARISON:  10/27/2013 CT angiogram of the chest, abdomen and pelvis. FINDINGS: Lower chest: Trace dependent bilateral pleural effusions, left greater than right, with mild hypoventilatory changes at the dependent lung bases. Coronary atherosclerosis. Tip of superior approach central venous catheter seen near the cavoatrial junction. Hepatobiliary: Normal liver size. No liver mass. Normal gallbladder with no radiopaque cholelithiasis. No biliary ductal dilatation. Pancreas: Normal, with no mass or duct dilation. Spleen: Normal size. No mass. Adrenals/Urinary Tract: Normal adrenals. No renal stones. No hydronephrosis. No contour deforming renal masses. Normal bladder. Stomach/Bowel: Normal non-distended stomach. Normal caliber small bowel with no small bowel wall thickening. Normal appendix. Mild left colonic  diverticulosis with no large bowel wall thickening or significant pericolonic fat stranding. Vascular/Lymphatic: Atherosclerotic nonaneurysmal abdominal aorta. No pathologically enlarged lymph nodes in the abdomen or pelvis. Reproductive: Normal size prostate. Other: No pneumoperitoneum, ascites or focal fluid collection. Small fat containing right inguinal hernia. Musculoskeletal: No aggressive appearing focal osseous lesions. Moderate lumbar degenerative disc disease with large Schmorl's nodes at the superior and inferior L3 vertebral body. Intact lower sternotomy wires. IMPRESSION: 1. No acute abnormality. No evidence of bowel obstruction or acute bowel inflammation. Mild left colonic diverticulosis, with no evidence of acute diverticulitis. 2. Trace dependent bilateral pleural effusions, left greater than right. 3. Small fat containing right inguinal hernia. 4. Coronary atherosclerosis. 5. Aortic Atherosclerosis (ICD10-I70.0). Electronically Signed   By: Ilona Sorrel M.D.   On: 12/15/2020 10:16   CT HEAD WO CONTRAST  Result Date: 12/15/2020 CLINICAL DATA:  Mental status change, unknown cause. Altered mental status. EXAM: CT HEAD WITHOUT CONTRAST TECHNIQUE: Contiguous axial images were obtained from the base of the skull through the vertex without intravenous contrast. COMPARISON:  Prior head CT examination 08/08/2006. FINDINGS: Brain: Mild generalized cerebral and cerebellar atrophy. Chronic lacunar infarct within the left basal ganglia/internal capsule. Chronic small-vessel infarct along the lateral aspect of the right caudate nucleus. Small chronic infarct within the left cerebellar hemisphere. There is no acute intracranial hemorrhage. No demarcated cortical infarct. No extra-axial fluid collection. No evidence of intracranial mass. No midline shift. Vascular: No hyperdense vessel or unexpected calcification. Skull: No calvarial fracture or focal suspicious osseous lesion. Sinuses/Orbits: Visualized  orbits show no acute finding. Moderate mucosal thickening within the right sphenoid sinus. Mild mucosal thickening within the inferior maxillary sinuses bilaterally. IMPRESSION: No evidence of acute intracranial abnormality. Chronic small-vessel infarcts within the left basal ganglia/internal capsule and within the right frontal white matter along the lateral aspect of the caudate nucleus. Small chronic infarct within the left cerebellar hemisphere. Mild generalized parenchymal atrophy. Electronically Signed   By: Kellie Simmering DO   On: 12/15/2020 13:21   DG Chest Port 1 View  Result Date: 12/15/2020 CLINICAL DATA:  70 year old male with possible sepsis. Found unresponsive. Hypoglycemia. EXAM: PORTABLE CHEST 1 VIEW COMPARISON:  Portable chest 07/22/2019 and earlier. FINDINGS: Portable AP upright view at 0740 hours. Right chest dual lumen dialysis type catheter. Mild cardiomegaly appears new or increased from last year. Prior CABG. Other mediastinal contours are within normal limits. Visualized tracheal air column is within normal limits. Coarse bilateral pulmonary interstitial and patchy bilateral upper lobe pulmonary opacity which appears waxing and waning in both lungs since 2020. No pneumothorax or pleural effusion. No air bronchograms. Paucity of bowel gas in the upper abdomen. No acute  osseous abnormality identified. Moderate to severe chronic changes of the right humeral head. IMPRESSION: Coarse bilateral interstitial and patchy asymmetric pulmonary opacity waxing and waning in both lungs over this series of exams since 2020. Cardiomegaly with no definite pleural effusion. Top differential considerations are recurrent asymmetric pulmonary edema and bilateral pneumonia. Electronically Signed   By: Genevie Ann M.D.   On: 12/15/2020 07:59    Subjective: Patient was seen and examined today.  Denies any complaints.  No recent upper respiratory symptoms.  No fever or chills.  Per patient he did had prior incidence  of becoming hypoglycemic on the same insulin dose which works most of the time. He thinks that he is now at his baseline and wants to go home after dialysis.  Discharge Exam: Vitals:   12/16/20 0826 12/16/20 1053  BP: (!) 162/70 (!) 154/72  Pulse: 60 69  Resp: 18 18  Temp: 97.9 F (36.6 C) 97.9 F (36.6 C)  SpO2: 98% 100%   Vitals:   12/16/20 0600 12/16/20 0719 12/16/20 0826 12/16/20 1053  BP: (!) 130/57 (!) 151/66 (!) 162/70 (!) 154/72  Pulse: (!) 57 61 60 69  Resp: '16 15 18 18  '$ Temp:  97.8 F (36.6 C) 97.9 F (36.6 C) 97.9 F (36.6 C)  TempSrc:  Oral    SpO2: 99% 97% 98% 100%  Weight:      Height:        General: Pt is alert, awake, not in acute distress Cardiovascular: RRR, S1/S2 +, no rubs, no gallops Respiratory: CTA bilaterally, no wheezing, no rhonchi Abdominal: Soft, NT, ND, bowel sounds + Extremities: Signs of chronic venous stasis and 1+ edema.  The results of significant diagnostics from this hospitalization (including imaging, microbiology, ancillary and laboratory) are listed below for reference.    Microbiology: Recent Results (from the past 240 hour(s))  Resp Panel by RT-PCR (Flu A&B, Covid) Nasopharyngeal Swab     Status: None   Collection Time: 12/15/20  7:32 AM   Specimen: Nasopharyngeal Swab; Nasopharyngeal(NP) swabs in vial transport medium  Result Value Ref Range Status   SARS Coronavirus 2 by RT PCR NEGATIVE NEGATIVE Final    Comment: (NOTE) SARS-CoV-2 target nucleic acids are NOT DETECTED.  The SARS-CoV-2 RNA is generally detectable in upper respiratory specimens during the acute phase of infection. The lowest concentration of SARS-CoV-2 viral copies this assay can detect is 138 copies/mL. A negative result does not preclude SARS-Cov-2 infection and should not be used as the sole basis for treatment or other patient management decisions. A negative result may occur with  improper specimen collection/handling, submission of specimen  other than nasopharyngeal swab, presence of viral mutation(s) within the areas targeted by this assay, and inadequate number of viral copies(<138 copies/mL). A negative result must be combined with clinical observations, patient history, and epidemiological information. The expected result is Negative.  Fact Sheet for Patients:  EntrepreneurPulse.com.au  Fact Sheet for Healthcare Providers:  IncredibleEmployment.be  This test is no t yet approved or cleared by the [place] FDA and  has been authorized for detection and/or diagnosis of SARS-CoV-2 by FDA under an Emergency Use Authorization (EUA). This EUA will remain  in effect (meaning this test can be used) for the duration of the COVID-19 declaration under Section 564(b)(1) of the Act, 21 U.S.C.section 360bbb-3(b)(1), unless the authorization is terminated  or revoked sooner.       Influenza A by PCR NEGATIVE NEGATIVE Final   Influenza B by PCR NEGATIVE NEGATIVE Final  Comment: (NOTE) The Xpert Xpress SARS-CoV-2/FLU/RSV plus assay is intended as an aid in the diagnosis of influenza from Nasopharyngeal swab specimens and should not be used as a sole basis for treatment. Nasal washings and aspirates are unacceptable for Xpert Xpress SARS-CoV-2/FLU/RSV testing.  Fact Sheet for Patients: EntrepreneurPulse.com.au  Fact Sheet for Healthcare Providers: IncredibleEmployment.be  This test is not yet approved or cleared by the [place] FDA and has been authorized for detection and/or diagnosis of SARS-CoV-2 by FDA under an Emergency Use Authorization (EUA). This EUA will remain in effect (meaning this test can be used) for the duration of the COVID-19 declaration under Section 564(b)(1) of the Act, 21 U.S.C. section 360bbb-3(b)(1), unless the authorization is terminated or revoked.  Performed at [facility], [address]  [place], [place] [address]   Culture, blood (single)     Status: None (Preliminary result)   Collection Time: 12/15/20  7:40 AM   Specimen: BLOOD  Result Value Ref Range Status   Specimen Description BLOOD LEFT ARM  Final   Special Requests   Final    BOTTLES DRAWN AEROBIC AND ANAEROBIC Blood Culture adequate volume   Culture   Final    NO GROWTH < 24 HOURS Performed at [facility], [address], [place], [place] [address]    Report Status PENDING  Incomplete  Blood culture (routine single)     Status: None (Preliminary result)   Collection Time: 12/15/20  8:25 AM   Specimen: BLOOD RIGHT HAND  Result Value Ref Range Status   Specimen Description BLOOD RIGHT HAND  Final   Special Requests   Final    BOTTLES DRAWN AEROBIC AND ANAEROBIC Blood Culture adequate volume   Culture   Final    NO GROWTH < 24 HOURS Performed at [facility], [address], [place], [place] [address]    Report Status PENDING  Incomplete  Culture, blood (routine x 2) Call MD if unable to obtain prior to antibiotics being given     Status: None (Preliminary result)   Collection Time: 12/15/20  3:16 PM   Specimen: BLOOD  Result Value Ref Range Status   Specimen Description BLOOD RIGHT ANTECUBITAL  Final   Special Requests   Final    BOTTLES DRAWN AEROBIC AND ANAEROBIC Blood Culture adequate volume   Culture   Final    NO GROWTH < 24 HOURS Performed at [facility], [address], [place], [place] [address]    Report Status PENDING  Incomplete  Culture, blood (routine x 2) Call MD if unable to obtain prior to antibiotics being given     Status: None (Preliminary result)   Collection Time: 12/15/20  3:24 PM   Specimen: BLOOD  Result Value Ref Range Status   Specimen Description BLOOD BLOOD RIGHT HAND  Final   Special Requests   Final    BOTTLES DRAWN AEROBIC AND ANAEROBIC Blood Culture adequate volume   Culture   Final    NO GROWTH < 24 HOURS Performed at [facility], [address] [place], [place] [address]    Report Status PENDING  Incomplete  MRSA PCR Screening     Status: None   Collection Time: 12/16/20 10:17 AM  Result Value Ref Range Status   MRSA by PCR NEGATIVE NEGATIVE Final    Comment:        The GeneXpert MRSA Assay (FDA approved for NASAL specimens only), is one component of a comprehensive MRSA colonization surveillance  program. It is not intended to diagnose MRSA infection nor to guide or monitor treatment for MRSA infections. Performed at [facility], [address] [place], [place] [address]      Labs: BNP (last 3 results) No results for input(s): BNP in the last 8760 hours. Basic Metabolic Panel: Recent Labs  Lab 12/15/20 0731 12/16/20 0342  NA 136 133*  K 5.0 5.2*  CL 97* 100  CO2 27 25  GLUCOSE 158* 309*  BUN 35* 47*  CREATININE 4.65* 5.40*  CALCIUM 8.3* 8.6*   Liver Function Tests: Recent Labs  Lab 12/15/20 0731  AST 41  ALT 13  ALKPHOS 164*  BILITOT 1.2  PROT 7.2  ALBUMIN 3.6   Recent Labs  Lab 12/15/20 0731  LIPASE 43   No results for input(s): AMMONIA in the last 168 hours. CBC: Recent Labs  Lab 12/15/20 0731 12/16/20 0342  WBC 13.2* 7.1  NEUTROABS 10.7*  --   HGB 12.2* 10.2*  HCT 36.7* 31.4*  MCV 100.3* 100.0  PLT 203 175   Cardiac Enzymes: No results for input(s): CKTOTAL, CKMB, CKMBINDEX, TROPONINI in the last 168 hours. BNP: Invalid input(s): POCBNP CBG: Recent Labs  Lab 12/15/20 2148 12/15/20 2340 12/16/20 0418 12/16/20 0734 12/16/20 1136  GLUCAP 421* 418* 298* 282* 284*   D-Dimer No results for input(s): DDIMER in the last 72 hours. Hgb A1c No results for input(s): HGBA1C in the last 72 hours. Lipid Profile No results for input(s): CHOL, HDL, LDLCALC, TRIG, CHOLHDL, LDLDIRECT in the last 72 hours. Thyroid function studies No results for input(s): TSH, T4TOTAL, T3FREE, THYROIDAB in the last 72 hours.  Invalid input(s): FREET3 Anemia work  up No results for input(s): VITAMINB12, FOLATE, FERRITIN, TIBC, IRON, RETICCTPCT in the last 72 hours. Urinalysis    Component Value Date/Time   COLORURINE STRAW (A) 12/15/2020 1820   APPEARANCEUR CLEAR (A) 12/15/2020 1820   APPEARANCEUR Clear 01/07/2014 1900   LABSPEC 1.008 12/15/2020 1820   LABSPEC 1.015 01/07/2014 1900   PHURINE 9.0 (H) 12/15/2020 1820   GLUCOSEU >=500 (A) 12/15/2020 1820   GLUCOSEU >=500 01/07/2014 1900   HGBUR NEGATIVE 12/15/2020 1820   BILIRUBINUR NEGATIVE 12/15/2020 1820   BILIRUBINUR Negative 01/07/2014 1900   KETONESUR 5 (A) 12/15/2020 1820   PROTEINUR >=300 (A) 12/15/2020 1820   NITRITE NEGATIVE 12/15/2020 1820   LEUKOCYTESUR NEGATIVE 12/15/2020 1820   LEUKOCYTESUR Negative 01/07/2014 1900   Sepsis Labs Invalid input(s): PROCALCITONIN,  WBC,  LACTICIDVEN Microbiology Recent Results (from the past 240 hour(s))  Resp Panel by RT-PCR (Flu A&B, Covid) Nasopharyngeal Swab     Status: None   Collection Time: 12/15/20  7:32 AM   Specimen: Nasopharyngeal Swab; Nasopharyngeal(NP) swabs in vial transport medium  Result Value Ref Range Status   SARS Coronavirus 2 by RT PCR NEGATIVE NEGATIVE Final    Comment: (NOTE) SARS-CoV-2 target nucleic acids are NOT DETECTED.  The SARS-CoV-2 RNA is generally detectable in upper respiratory specimens during the acute phase of infection. The lowest concentration of SARS-CoV-2 viral copies this assay can detect is 138 copies/mL. A negative result does not preclude SARS-Cov-2 infection and should not be used as the sole basis for treatment or other patient management decisions. A negative result may occur with  improper specimen collection/handling, submission of specimen other than nasopharyngeal swab, presence of viral mutation(s) within the areas targeted by this assay, and inadequate number of viral copies(<138 copies/mL). A negative result must be combined with clinical observations, patient history, and  epidemiological  information. The expected result is Negative.  Fact Sheet for Patients:  EntrepreneurPulse.com.au  Fact Sheet for Healthcare Providers:  IncredibleEmployment.be  This test is no t yet approved or cleared by the [place] FDA and  has been authorized for detection and/or diagnosis of SARS-CoV-2 by FDA under an Emergency Use Authorization (EUA). This EUA will remain  in effect (meaning this test can be used) for the duration of the COVID-19 declaration under Section 564(b)(1) of the Act, 21 U.S.C.section 360bbb-3(b)(1), unless the authorization is terminated  or revoked sooner.       Influenza A by PCR NEGATIVE NEGATIVE Final   Influenza B by PCR NEGATIVE NEGATIVE Final    Comment: (NOTE) The Xpert Xpress SARS-CoV-2/FLU/RSV plus assay is intended as an aid in the diagnosis of influenza from Nasopharyngeal swab specimens and should not be used as a sole basis for treatment. Nasal washings and aspirates are unacceptable for Xpert Xpress SARS-CoV-2/FLU/RSV testing.  Fact Sheet for Patients: EntrepreneurPulse.com.au  Fact Sheet for Healthcare Providers: IncredibleEmployment.be  This test is not yet approved or cleared by the [place] FDA and has been authorized for detection and/or diagnosis of SARS-CoV-2 by FDA under an Emergency Use Authorization (EUA). This EUA will remain in effect (meaning this test can be used) for the duration of the COVID-19 declaration under Section 564(b)(1) of the Act, 21 U.S.C. section 360bbb-3(b)(1), unless the authorization is terminated or revoked.  Performed at [facility], [address] [place], [place] [address]   Culture, blood (single)     Status: None (Preliminary result)   Collection Time: 12/15/20  7:40 AM   Specimen: BLOOD  Result Value Ref Range Status   Specimen Description BLOOD LEFT ARM  Final   Special Requests   Final     BOTTLES DRAWN AEROBIC AND ANAEROBIC Blood Culture adequate volume   Culture   Final    NO GROWTH < 24 HOURS Performed at [facility], [address] [place], [place] [address]    Report Status PENDING  Incomplete  Blood culture (routine single)     Status: None (Preliminary result)   Collection Time: 12/15/20  8:25 AM   Specimen: BLOOD RIGHT HAND  Result Value Ref Range Status   Specimen Description BLOOD RIGHT HAND  Final   Special Requests   Final    BOTTLES DRAWN AEROBIC AND ANAEROBIC Blood Culture adequate volume   Culture   Final    NO GROWTH < 24 HOURS Performed at [facility], [address], [place], [place] [address]    Report Status PENDING  Incomplete  Culture, blood (routine x 2) Call MD if unable to obtain prior to antibiotics being given     Status: None (Preliminary result)   Collection Time: 12/15/20  3:16 PM   Specimen: BLOOD  Result Value Ref Range Status   Specimen Description BLOOD RIGHT ANTECUBITAL  Final   Special Requests   Final    BOTTLES DRAWN AEROBIC AND ANAEROBIC Blood Culture adequate volume   Culture   Final    NO GROWTH < 24 HOURS Performed at [facility], [address] [place], [place] [address]    Report Status PENDING  Incomplete  Culture, blood (routine x 2) Call MD if unable to obtain prior to antibiotics being given     Status: None (Preliminary result)   Collection Time: 12/15/20  3:24 PM   Specimen: BLOOD  Result Value Ref Range Status   Specimen Description BLOOD BLOOD RIGHT  HAND  Final   Special Requests   Final    BOTTLES DRAWN AEROBIC AND ANAEROBIC Blood Culture adequate volume   Culture   Final    NO GROWTH < 24 HOURS Performed at [facility], [address] [place], [place] [address]    Report Status PENDING  Incomplete  MRSA PCR Screening     Status: None   Collection Time: 12/16/20 10:17 AM  Result Value Ref Range Status   MRSA by PCR NEGATIVE NEGATIVE Final    Comment:         The GeneXpert MRSA Assay (FDA approved for NASAL specimens only), is one component of a comprehensive MRSA colonization surveillance program. It is not intended to diagnose MRSA infection nor to guide or monitor treatment for MRSA infections. Performed at [facility], [address] [place], [place] [address]     Time coordinating discharge: Over 30 minutes  SIGNED:  Lorella Nimrod, MD  [facility] 12/16/2020, 3:18 PM  If 7PM-7AM, please contact night-coverage [email or web address]  This record has been created using Systems analyst. Errors have been sought and corrected,but may not always be located. Such creation errors do not reflect on the standard of care.

## 2020-12-16 NOTE — Progress Notes (Signed)
Pt. toelrated tx w/o incident. Well functioning rt. chest CVC held BFR throughout tx. Pt. educated on CVC care. VSS. Pt. scheduled for discharge, no concerns.

## 2020-12-16 NOTE — Progress Notes (Signed)
[facility]  ROUNDING NOTE   Subjective:   Patient states he is doing well. Dialysis for later today.   Afebrile   Objective:  Vital signs in last 24 hours:  Temp:  [97.7 F (36.5 C)-98.3 F (36.8 C)] 97.9 F (36.6 C) (06/21 1053) Pulse Rate:  [57-76] 69 (06/21 1053) Resp:  [10-21] 18 (06/21 1053) BP: (125-169)/(53-80) 154/72 (06/21 1053) SpO2:  [91 %-100 %] 100 % (06/21 1053)  Weight change:  Filed Weights   12/15/20 0725  Weight: 84.6 kg    Intake/Output: I/O last 3 completed shifts: In: 1300 [I.V.:500; IV Piggyback:800] Out: 100 [Urine:100]   Intake/Output this shift:  Total I/O In: 560 [P.O.:560] Out: 250 [Urine:250]  Physical Exam: General: NAD  Head: Normocephalic, atraumatic. Moist oral mucosal membranes  Eyes: Anicteric, PERRL  Neck: Supple, trachea midline  Lungs:  Clear to auscultation  Heart: Regular rate and rhythm  Abdomen:  Soft, nontender,   Extremities:  no peripheral edema.  Neurologic: Nonfocal, moving all four extremities  Skin: No lesions  Access: RIJ permcath    Basic Metabolic Panel: Recent Labs  Lab 12/15/20 0731 12/16/20 0342  NA 136 133*  K 5.0 5.2*  CL 97* 100  CO2 27 25  GLUCOSE 158* 309*  BUN 35* 47*  CREATININE 4.65* 5.40*  CALCIUM 8.3* 8.6*     Liver Function Tests: Recent Labs  Lab 12/15/20 0731  AST 41  ALT 13  ALKPHOS 164*  BILITOT 1.2  PROT 7.2  ALBUMIN 3.6    Recent Labs  Lab 12/15/20 0731  LIPASE 43    No results for input(s): AMMONIA in the last 168 hours.  CBC: Recent Labs  Lab 12/15/20 0731 12/16/20 0342  WBC 13.2* 7.1  NEUTROABS 10.7*  --   HGB 12.2* 10.2*  HCT 36.7* 31.4*  MCV 100.3* 100.0  PLT 203 175     Cardiac Enzymes: No results for input(s): CKTOTAL, CKMB, CKMBINDEX, TROPONINI in the last 168 hours.  BNP: Invalid input(s): POCBNP  CBG: Recent Labs  Lab 12/15/20 1840 12/15/20 2148 12/15/20 2340 12/16/20 0418 12/16/20 0734  GLUCAP 355* 421* 418*  298* 282*     Microbiology: Results for orders placed or performed during the hospital encounter of 12/15/20  Resp Panel by RT-PCR (Flu A&B, Covid) Nasopharyngeal Swab     Status: None   Collection Time: 12/15/20  7:32 AM   Specimen: Nasopharyngeal Swab; Nasopharyngeal(NP) swabs in vial transport medium  Result Value Ref Range Status   SARS Coronavirus 2 by RT PCR NEGATIVE NEGATIVE Final    Comment: (NOTE) SARS-CoV-2 target nucleic acids are NOT DETECTED.  The SARS-CoV-2 RNA is generally detectable in upper respiratory specimens during the acute phase of infection. The lowest concentration of SARS-CoV-2 viral copies this assay can detect is 138 copies/mL. A negative result does not preclude SARS-Cov-2 infection and should not be used as the sole basis for treatment or other patient management decisions. A negative result may occur with  improper specimen collection/handling, submission of specimen other than nasopharyngeal swab, presence of viral mutation(s) within the areas targeted by this assay, and inadequate number of viral copies(<138 copies/mL). A negative result must be combined with clinical observations, patient history, and epidemiological information. The expected result is Negative.  Fact Sheet for Patients:  EntrepreneurPulse.com.au  Fact Sheet for Healthcare Providers:  IncredibleEmployment.be  This test is no t yet approved or cleared by the [place] FDA and  has been authorized for detection and/or diagnosis of SARS-CoV-2  by FDA under an Emergency Use Authorization (EUA). This EUA will remain  in effect (meaning this test can be used) for the duration of the COVID-19 declaration under Section 564(b)(1) of the Act, 21 U.S.C.section 360bbb-3(b)(1), unless the authorization is terminated  or revoked sooner.       Influenza A by PCR NEGATIVE NEGATIVE Final   Influenza B by PCR NEGATIVE NEGATIVE Final    Comment:  (NOTE) The Xpert Xpress SARS-CoV-2/FLU/RSV plus assay is intended as an aid in the diagnosis of influenza from Nasopharyngeal swab specimens and should not be used as a sole basis for treatment. Nasal washings and aspirates are unacceptable for Xpert Xpress SARS-CoV-2/FLU/RSV testing.  Fact Sheet for Patients: EntrepreneurPulse.com.au  Fact Sheet for Healthcare Providers: IncredibleEmployment.be  This test is not yet approved or cleared by the [place] FDA and has been authorized for detection and/or diagnosis of SARS-CoV-2 by FDA under an Emergency Use Authorization (EUA). This EUA will remain in effect (meaning this test can be used) for the duration of the COVID-19 declaration under Section 564(b)(1) of the Act, 21 U.S.C. section 360bbb-3(b)(1), unless the authorization is terminated or revoked.  Performed at [facility], [address] [place], [place] [address]   Culture, blood (single)     Status: None (Preliminary result)   Collection Time: 12/15/20  7:40 AM   Specimen: BLOOD  Result Value Ref Range Status   Specimen Description BLOOD LEFT ARM  Final   Special Requests   Final    BOTTLES DRAWN AEROBIC AND ANAEROBIC Blood Culture adequate volume   Culture   Final    NO GROWTH < 24 HOURS Performed at [facility], [address] [place], [place] [address]    Report Status PENDING  Incomplete  Blood culture (routine single)     Status: None (Preliminary result)   Collection Time: 12/15/20  8:25 AM   Specimen: BLOOD RIGHT HAND  Result Value Ref Range Status   Specimen Description BLOOD RIGHT HAND  Final   Special Requests   Final    BOTTLES DRAWN AEROBIC AND ANAEROBIC Blood Culture adequate volume   Culture   Final    NO GROWTH < 24 HOURS Performed at [facility], [address], [place], [place] [address]    Report Status PENDING  Incomplete  Culture, blood (routine x 2) Call MD if unable to  obtain prior to antibiotics being given     Status: None (Preliminary result)   Collection Time: 12/15/20  3:16 PM   Specimen: BLOOD  Result Value Ref Range Status   Specimen Description BLOOD RIGHT ANTECUBITAL  Final   Special Requests   Final    BOTTLES DRAWN AEROBIC AND ANAEROBIC Blood Culture adequate volume   Culture   Final    NO GROWTH < 24 HOURS Performed at [facility], [address] [place],  [address]    Report Status PENDING  Incomplete  Culture, blood (routine x 2) Call MD if unable to obtain prior to antibiotics being given     Status: None (Preliminary result)   Collection Time: 12/15/20  3:24 PM   Specimen: BLOOD  Result Value Ref Range Status   Specimen Description BLOOD BLOOD RIGHT HAND  Final   Special Requests   Final    BOTTLES DRAWN AEROBIC AND ANAEROBIC Blood Culture adequate volume   Culture   Final    NO GROWTH < 24 HOURS Performed at [facility], [address] [place],  [place] [address]    Report Status PENDING  Incomplete    Coagulation Studies: No results for input(s): LABPROT, INR in the last 72 hours.  Urinalysis: Recent Labs    12/15/20 1820  COLORURINE STRAW*  LABSPEC 1.008  PHURINE 9.0*  GLUCOSEU >=500*  HGBUR NEGATIVE  BILIRUBINUR NEGATIVE  KETONESUR 5*  PROTEINUR >=300*  NITRITE NEGATIVE  LEUKOCYTESUR NEGATIVE       Imaging: CT ABDOMEN PELVIS WO CONTRAST  Result Date: 12/15/2020 CLINICAL DATA:  Found unresponsive with hypoglycemia. EXAM: CT ABDOMEN AND PELVIS WITHOUT CONTRAST TECHNIQUE: Multidetector CT imaging of the abdomen and pelvis was performed following the standard protocol without IV contrast. COMPARISON:  10/27/2013 CT angiogram of the chest, abdomen and pelvis. FINDINGS: Lower chest: Trace dependent bilateral pleural effusions, left greater than right, with mild hypoventilatory changes at the dependent lung bases. Coronary atherosclerosis. Tip of superior approach central venous catheter  seen near the cavoatrial junction. Hepatobiliary: Normal liver size. No liver mass. Normal gallbladder with no radiopaque cholelithiasis. No biliary ductal dilatation. Pancreas: Normal, with no mass or duct dilation. Spleen: Normal size. No mass. Adrenals/Urinary Tract: Normal adrenals. No renal stones. No hydronephrosis. No contour deforming renal masses. Normal bladder. Stomach/Bowel: Normal non-distended stomach. Normal caliber small bowel with no small bowel wall thickening. Normal appendix. Mild left colonic diverticulosis with no large bowel wall thickening or significant pericolonic fat stranding. Vascular/Lymphatic: Atherosclerotic nonaneurysmal abdominal aorta. No pathologically enlarged lymph nodes in the abdomen or pelvis. Reproductive: Normal size prostate. Other: No pneumoperitoneum, ascites or focal fluid collection. Small fat containing right inguinal hernia. Musculoskeletal: No aggressive appearing focal osseous lesions. Moderate lumbar degenerative disc disease with large Schmorl's nodes at the superior and inferior L3 vertebral body. Intact lower sternotomy wires. IMPRESSION: 1. No acute abnormality. No evidence of bowel obstruction or acute bowel inflammation. Mild left colonic diverticulosis, with no evidence of acute diverticulitis. 2. Trace dependent bilateral pleural effusions, left greater than right. 3. Small fat containing right inguinal hernia. 4. Coronary atherosclerosis. 5. Aortic Atherosclerosis (ICD10-I70.0). Electronically Signed   By: Ilona Sorrel M.D.   On: 12/15/2020 10:16   CT HEAD WO CONTRAST  Result Date: 12/15/2020 CLINICAL DATA:  Mental status change, unknown cause. Altered mental status. EXAM: CT HEAD WITHOUT CONTRAST TECHNIQUE: Contiguous axial images were obtained from the base of the skull through the vertex without intravenous contrast. COMPARISON:  Prior head CT examination 08/08/2006. FINDINGS: Brain: Mild generalized cerebral and cerebellar atrophy. Chronic lacunar  infarct within the left basal ganglia/internal capsule. Chronic small-vessel infarct along the lateral aspect of the right caudate nucleus. Small chronic infarct within the left cerebellar hemisphere. There is no acute intracranial hemorrhage. No demarcated cortical infarct. No extra-axial fluid collection. No evidence of intracranial mass. No midline shift. Vascular: No hyperdense vessel or unexpected calcification. Skull: No calvarial fracture or focal suspicious osseous lesion. Sinuses/Orbits: Visualized orbits show no acute finding. Moderate mucosal thickening within the right sphenoid sinus. Mild mucosal thickening within the inferior maxillary sinuses bilaterally. IMPRESSION: No evidence of acute intracranial abnormality. Chronic small-vessel infarcts within the left basal ganglia/internal capsule and within the right frontal white matter along the lateral aspect of the caudate nucleus. Small chronic infarct within the left cerebellar hemisphere. Mild generalized parenchymal atrophy. Electronically Signed   By: Kellie Simmering DO   On: 12/15/2020 13:21   DG Chest Port 1 View  Result Date: 12/15/2020 CLINICAL DATA:  70 year old male with possible sepsis. Found unresponsive. Hypoglycemia. EXAM: PORTABLE CHEST 1 VIEW COMPARISON:  Portable chest 07/22/2019  and earlier. FINDINGS: Portable AP upright view at 0740 hours. Right chest dual lumen dialysis type catheter. Mild cardiomegaly appears new or increased from last year. Prior CABG. Other mediastinal contours are within normal limits. Visualized tracheal air column is within normal limits. Coarse bilateral pulmonary interstitial and patchy bilateral upper lobe pulmonary opacity which appears waxing and waning in both lungs since 2020. No pneumothorax or pleural effusion. No air bronchograms. Paucity of bowel gas in the upper abdomen. No acute osseous abnormality identified. Moderate to severe chronic changes of the right humeral head. IMPRESSION: Coarse  bilateral interstitial and patchy asymmetric pulmonary opacity waxing and waning in both lungs over this series of exams since 2020. Cardiomegaly with no definite pleural effusion. Top differential considerations are recurrent asymmetric pulmonary edema and bilateral pneumonia. Electronically Signed   By: Genevie Ann M.D.   On: 12/15/2020 07:59     Medications:     amoxicillin-clavulanate  1 tablet Oral Daily   aspirin EC  81 mg Oral Daily   atorvastatin  80 mg Oral Daily   carvedilol  25 mg Oral BID   Chlorhexidine Gluconate Cloth  6 each Topical Q0600   clopidogrel  75 mg Oral Daily   DULoxetine  30 mg Oral Daily   heparin  5,000 Units Subcutaneous Q8H   insulin aspart  0-5 Units Subcutaneous QHS   insulin aspart  0-9 Units Subcutaneous TID WC   ipratropium-albuterol  3 mL Nebulization TID   levothyroxine  100 mcg Oral Daily   pentoxifylline  400 mg Oral BID   acetaminophen, albuterol, dextromethorphan-guaiFENesin, dextrose, hydrALAZINE, ondansetron (ZOFRAN) IV, traMADol  Assessment/ Plan:  [name] is a 70 y.o.  male with end stage renal disease on hemodialysis, hypertension, diabetes mellitus type II insulin dependent, coronary artery disease, congestive heart failure, COPD, hyperlipidemia, hypothyroidism, depression who is admitted to [facility] on 12/15/2020 for CAP (community acquired pneumonia) [J18.9] HCAP (healthcare-associated pneumonia) [J18.9] Sepsis with encephalopathy without septic shock, due to unspecified organism ([place]) [A41.9, R65.20, G93.40]  [facility] TTS [facility] [address] RIJ permcath 81kg  End Stage Renal Disease: on TTS schedule. Dialysis for today. Orders prepared.   2. Hypertension: home regimen of enalapril and torsemide.   3. Diabetes mellitus type II with chronic kidney disease: insulin dependent. Hemoglobin A1c of 7.5% on 11/27/20.   4. Anemia with chronic kidney disease: macrocytic. Hemoglobin 10.2. Mircera as outpatient.   5. Secondary  Hyperparathyroidism: with hyperphosphatemia and hypocalcemia. PTH 1160, phos 6.1 and calcium 7.8.  - sevelamer with meals.   6. Sepsis: on PO augmentin   LOS: 1 [place] 6/21/202211:07 AM

## 2020-12-17 LAB — LEGIONELLA PNEUMOPHILA SEROGP 1 UR AG: L. pneumophila Serogp 1 Ur Ag: NEGATIVE

## 2020-12-18 DIAGNOSIS — Z992 Dependence on renal dialysis: Secondary | ICD-10-CM | POA: Diagnosis not present

## 2020-12-18 DIAGNOSIS — N2581 Secondary hyperparathyroidism of renal origin: Secondary | ICD-10-CM | POA: Diagnosis not present

## 2020-12-18 DIAGNOSIS — N186 End stage renal disease: Secondary | ICD-10-CM | POA: Diagnosis not present

## 2020-12-20 DIAGNOSIS — Z992 Dependence on renal dialysis: Secondary | ICD-10-CM | POA: Diagnosis not present

## 2020-12-20 DIAGNOSIS — N186 End stage renal disease: Secondary | ICD-10-CM | POA: Diagnosis not present

## 2020-12-20 DIAGNOSIS — N2581 Secondary hyperparathyroidism of renal origin: Secondary | ICD-10-CM | POA: Diagnosis not present

## 2020-12-20 LAB — CULTURE, BLOOD (SINGLE)
Culture: NO GROWTH
Culture: NO GROWTH
Special Requests: ADEQUATE
Special Requests: ADEQUATE

## 2020-12-20 LAB — CULTURE, BLOOD (ROUTINE X 2)
Culture: NO GROWTH
Culture: NO GROWTH
Special Requests: ADEQUATE
Special Requests: ADEQUATE

## 2020-12-23 DIAGNOSIS — Z992 Dependence on renal dialysis: Secondary | ICD-10-CM | POA: Diagnosis not present

## 2020-12-23 DIAGNOSIS — N186 End stage renal disease: Secondary | ICD-10-CM | POA: Diagnosis not present

## 2020-12-23 DIAGNOSIS — N2581 Secondary hyperparathyroidism of renal origin: Secondary | ICD-10-CM | POA: Diagnosis not present

## 2020-12-25 DIAGNOSIS — N2581 Secondary hyperparathyroidism of renal origin: Secondary | ICD-10-CM | POA: Diagnosis not present

## 2020-12-25 DIAGNOSIS — E1022 Type 1 diabetes mellitus with diabetic chronic kidney disease: Secondary | ICD-10-CM | POA: Diagnosis not present

## 2020-12-25 DIAGNOSIS — N186 End stage renal disease: Secondary | ICD-10-CM | POA: Diagnosis not present

## 2020-12-25 DIAGNOSIS — Z992 Dependence on renal dialysis: Secondary | ICD-10-CM | POA: Diagnosis not present

## 2020-12-27 DIAGNOSIS — N2581 Secondary hyperparathyroidism of renal origin: Secondary | ICD-10-CM | POA: Diagnosis not present

## 2020-12-27 DIAGNOSIS — N186 End stage renal disease: Secondary | ICD-10-CM | POA: Diagnosis not present

## 2020-12-27 DIAGNOSIS — Z992 Dependence on renal dialysis: Secondary | ICD-10-CM | POA: Diagnosis not present

## 2020-12-30 DIAGNOSIS — N186 End stage renal disease: Secondary | ICD-10-CM | POA: Diagnosis not present

## 2020-12-30 DIAGNOSIS — N2581 Secondary hyperparathyroidism of renal origin: Secondary | ICD-10-CM | POA: Diagnosis not present

## 2020-12-30 DIAGNOSIS — Z992 Dependence on renal dialysis: Secondary | ICD-10-CM | POA: Diagnosis not present

## 2020-12-31 DIAGNOSIS — J449 Chronic obstructive pulmonary disease, unspecified: Secondary | ICD-10-CM | POA: Diagnosis not present

## 2020-12-31 DIAGNOSIS — Z7902 Long term (current) use of antithrombotics/antiplatelets: Secondary | ICD-10-CM | POA: Diagnosis not present

## 2020-12-31 DIAGNOSIS — I132 Hypertensive heart and chronic kidney disease with heart failure and with stage 5 chronic kidney disease, or end stage renal disease: Secondary | ICD-10-CM | POA: Diagnosis not present

## 2020-12-31 DIAGNOSIS — I2510 Atherosclerotic heart disease of native coronary artery without angina pectoris: Secondary | ICD-10-CM | POA: Diagnosis not present

## 2020-12-31 DIAGNOSIS — I5020 Unspecified systolic (congestive) heart failure: Secondary | ICD-10-CM | POA: Diagnosis not present

## 2020-12-31 DIAGNOSIS — E1122 Type 2 diabetes mellitus with diabetic chronic kidney disease: Secondary | ICD-10-CM | POA: Diagnosis not present

## 2020-12-31 DIAGNOSIS — E1140 Type 2 diabetes mellitus with diabetic neuropathy, unspecified: Secondary | ICD-10-CM | POA: Diagnosis not present

## 2020-12-31 DIAGNOSIS — N185 Chronic kidney disease, stage 5: Secondary | ICD-10-CM | POA: Diagnosis not present

## 2020-12-31 DIAGNOSIS — I10 Essential (primary) hypertension: Secondary | ICD-10-CM | POA: Diagnosis not present

## 2020-12-31 DIAGNOSIS — E7800 Pure hypercholesterolemia, unspecified: Secondary | ICD-10-CM | POA: Diagnosis not present

## 2020-12-31 DIAGNOSIS — Z794 Long term (current) use of insulin: Secondary | ICD-10-CM | POA: Diagnosis not present

## 2021-01-03 DIAGNOSIS — N186 End stage renal disease: Secondary | ICD-10-CM | POA: Diagnosis not present

## 2021-01-03 DIAGNOSIS — Z992 Dependence on renal dialysis: Secondary | ICD-10-CM | POA: Diagnosis not present

## 2021-01-03 DIAGNOSIS — N2581 Secondary hyperparathyroidism of renal origin: Secondary | ICD-10-CM | POA: Diagnosis not present

## 2021-01-06 DIAGNOSIS — Z992 Dependence on renal dialysis: Secondary | ICD-10-CM | POA: Diagnosis not present

## 2021-01-06 DIAGNOSIS — N186 End stage renal disease: Secondary | ICD-10-CM | POA: Diagnosis not present

## 2021-01-06 DIAGNOSIS — N2581 Secondary hyperparathyroidism of renal origin: Secondary | ICD-10-CM | POA: Diagnosis not present

## 2021-01-08 DIAGNOSIS — N2581 Secondary hyperparathyroidism of renal origin: Secondary | ICD-10-CM | POA: Diagnosis not present

## 2021-01-08 DIAGNOSIS — N186 End stage renal disease: Secondary | ICD-10-CM | POA: Diagnosis not present

## 2021-01-08 DIAGNOSIS — Z992 Dependence on renal dialysis: Secondary | ICD-10-CM | POA: Diagnosis not present

## 2021-01-10 DIAGNOSIS — N186 End stage renal disease: Secondary | ICD-10-CM | POA: Diagnosis not present

## 2021-01-10 DIAGNOSIS — Z992 Dependence on renal dialysis: Secondary | ICD-10-CM | POA: Diagnosis not present

## 2021-01-10 DIAGNOSIS — N2581 Secondary hyperparathyroidism of renal origin: Secondary | ICD-10-CM | POA: Diagnosis not present

## 2021-01-13 DIAGNOSIS — N2581 Secondary hyperparathyroidism of renal origin: Secondary | ICD-10-CM | POA: Diagnosis not present

## 2021-01-13 DIAGNOSIS — N186 End stage renal disease: Secondary | ICD-10-CM | POA: Diagnosis not present

## 2021-01-13 DIAGNOSIS — Z992 Dependence on renal dialysis: Secondary | ICD-10-CM | POA: Diagnosis not present

## 2021-01-15 DIAGNOSIS — Z992 Dependence on renal dialysis: Secondary | ICD-10-CM | POA: Diagnosis not present

## 2021-01-15 DIAGNOSIS — N186 End stage renal disease: Secondary | ICD-10-CM | POA: Diagnosis not present

## 2021-01-15 DIAGNOSIS — N2581 Secondary hyperparathyroidism of renal origin: Secondary | ICD-10-CM | POA: Diagnosis not present

## 2021-01-16 DIAGNOSIS — E1022 Type 1 diabetes mellitus with diabetic chronic kidney disease: Secondary | ICD-10-CM | POA: Diagnosis not present

## 2021-01-16 DIAGNOSIS — Z7902 Long term (current) use of antithrombotics/antiplatelets: Secondary | ICD-10-CM | POA: Diagnosis not present

## 2021-01-16 DIAGNOSIS — I10 Essential (primary) hypertension: Secondary | ICD-10-CM | POA: Diagnosis not present

## 2021-01-16 DIAGNOSIS — Z992 Dependence on renal dialysis: Secondary | ICD-10-CM | POA: Diagnosis not present

## 2021-01-16 DIAGNOSIS — I132 Hypertensive heart and chronic kidney disease with heart failure and with stage 5 chronic kidney disease, or end stage renal disease: Secondary | ICD-10-CM | POA: Diagnosis not present

## 2021-01-16 DIAGNOSIS — N186 End stage renal disease: Secondary | ICD-10-CM | POA: Diagnosis not present

## 2021-01-16 DIAGNOSIS — I739 Peripheral vascular disease, unspecified: Secondary | ICD-10-CM | POA: Diagnosis not present

## 2021-01-16 DIAGNOSIS — R0989 Other specified symptoms and signs involving the circulatory and respiratory systems: Secondary | ICD-10-CM | POA: Diagnosis not present

## 2021-01-16 DIAGNOSIS — I6523 Occlusion and stenosis of bilateral carotid arteries: Secondary | ICD-10-CM | POA: Diagnosis not present

## 2021-01-16 DIAGNOSIS — E1122 Type 2 diabetes mellitus with diabetic chronic kidney disease: Secondary | ICD-10-CM | POA: Diagnosis not present

## 2021-01-16 DIAGNOSIS — I6502 Occlusion and stenosis of left vertebral artery: Secondary | ICD-10-CM | POA: Diagnosis not present

## 2021-01-16 DIAGNOSIS — N184 Chronic kidney disease, stage 4 (severe): Secondary | ICD-10-CM | POA: Diagnosis not present

## 2021-01-16 DIAGNOSIS — I5022 Chronic systolic (congestive) heart failure: Secondary | ICD-10-CM | POA: Diagnosis not present

## 2021-01-17 DIAGNOSIS — N2581 Secondary hyperparathyroidism of renal origin: Secondary | ICD-10-CM | POA: Diagnosis not present

## 2021-01-17 DIAGNOSIS — Z992 Dependence on renal dialysis: Secondary | ICD-10-CM | POA: Diagnosis not present

## 2021-01-17 DIAGNOSIS — N186 End stage renal disease: Secondary | ICD-10-CM | POA: Diagnosis not present

## 2021-01-20 DIAGNOSIS — N186 End stage renal disease: Secondary | ICD-10-CM | POA: Diagnosis not present

## 2021-01-20 DIAGNOSIS — N2581 Secondary hyperparathyroidism of renal origin: Secondary | ICD-10-CM | POA: Diagnosis not present

## 2021-01-20 DIAGNOSIS — Z992 Dependence on renal dialysis: Secondary | ICD-10-CM | POA: Diagnosis not present

## 2021-01-21 DIAGNOSIS — I5020 Unspecified systolic (congestive) heart failure: Secondary | ICD-10-CM | POA: Diagnosis not present

## 2021-01-22 DIAGNOSIS — N186 End stage renal disease: Secondary | ICD-10-CM | POA: Diagnosis not present

## 2021-01-22 DIAGNOSIS — Z992 Dependence on renal dialysis: Secondary | ICD-10-CM | POA: Diagnosis not present

## 2021-01-22 DIAGNOSIS — N2581 Secondary hyperparathyroidism of renal origin: Secondary | ICD-10-CM | POA: Diagnosis not present

## 2021-01-23 DIAGNOSIS — E10649 Type 1 diabetes mellitus with hypoglycemia without coma: Secondary | ICD-10-CM | POA: Diagnosis not present

## 2021-01-23 DIAGNOSIS — Z794 Long term (current) use of insulin: Secondary | ICD-10-CM | POA: Diagnosis not present

## 2021-01-23 DIAGNOSIS — N183 Chronic kidney disease, stage 3 unspecified: Secondary | ICD-10-CM | POA: Diagnosis not present

## 2021-01-23 DIAGNOSIS — E108 Type 1 diabetes mellitus with unspecified complications: Secondary | ICD-10-CM | POA: Diagnosis not present

## 2021-01-24 DIAGNOSIS — N186 End stage renal disease: Secondary | ICD-10-CM | POA: Diagnosis not present

## 2021-01-24 DIAGNOSIS — Z992 Dependence on renal dialysis: Secondary | ICD-10-CM | POA: Diagnosis not present

## 2021-01-24 DIAGNOSIS — N2581 Secondary hyperparathyroidism of renal origin: Secondary | ICD-10-CM | POA: Diagnosis not present

## 2021-01-25 DIAGNOSIS — N186 End stage renal disease: Secondary | ICD-10-CM | POA: Diagnosis not present

## 2021-01-25 DIAGNOSIS — Z992 Dependence on renal dialysis: Secondary | ICD-10-CM | POA: Diagnosis not present

## 2021-01-25 DIAGNOSIS — E1022 Type 1 diabetes mellitus with diabetic chronic kidney disease: Secondary | ICD-10-CM | POA: Diagnosis not present

## 2021-01-27 DIAGNOSIS — N186 End stage renal disease: Secondary | ICD-10-CM | POA: Diagnosis not present

## 2021-01-27 DIAGNOSIS — N2581 Secondary hyperparathyroidism of renal origin: Secondary | ICD-10-CM | POA: Diagnosis not present

## 2021-01-27 DIAGNOSIS — Z992 Dependence on renal dialysis: Secondary | ICD-10-CM | POA: Diagnosis not present

## 2021-01-28 DIAGNOSIS — E108 Type 1 diabetes mellitus with unspecified complications: Secondary | ICD-10-CM | POA: Diagnosis not present

## 2021-01-29 DIAGNOSIS — N2581 Secondary hyperparathyroidism of renal origin: Secondary | ICD-10-CM | POA: Diagnosis not present

## 2021-01-29 DIAGNOSIS — Z992 Dependence on renal dialysis: Secondary | ICD-10-CM | POA: Diagnosis not present

## 2021-01-29 DIAGNOSIS — N186 End stage renal disease: Secondary | ICD-10-CM | POA: Diagnosis not present

## 2021-01-31 DIAGNOSIS — N2581 Secondary hyperparathyroidism of renal origin: Secondary | ICD-10-CM | POA: Diagnosis not present

## 2021-01-31 DIAGNOSIS — Z992 Dependence on renal dialysis: Secondary | ICD-10-CM | POA: Diagnosis not present

## 2021-01-31 DIAGNOSIS — N186 End stage renal disease: Secondary | ICD-10-CM | POA: Diagnosis not present

## 2021-02-03 DIAGNOSIS — N2581 Secondary hyperparathyroidism of renal origin: Secondary | ICD-10-CM | POA: Diagnosis not present

## 2021-02-03 DIAGNOSIS — Z992 Dependence on renal dialysis: Secondary | ICD-10-CM | POA: Diagnosis not present

## 2021-02-03 DIAGNOSIS — N186 End stage renal disease: Secondary | ICD-10-CM | POA: Diagnosis not present

## 2021-02-05 DIAGNOSIS — N2581 Secondary hyperparathyroidism of renal origin: Secondary | ICD-10-CM | POA: Diagnosis not present

## 2021-02-05 DIAGNOSIS — Z992 Dependence on renal dialysis: Secondary | ICD-10-CM | POA: Diagnosis not present

## 2021-02-05 DIAGNOSIS — N186 End stage renal disease: Secondary | ICD-10-CM | POA: Diagnosis not present

## 2021-02-06 DIAGNOSIS — I5020 Unspecified systolic (congestive) heart failure: Secondary | ICD-10-CM | POA: Diagnosis not present

## 2021-02-06 DIAGNOSIS — N186 End stage renal disease: Secondary | ICD-10-CM | POA: Diagnosis not present

## 2021-02-06 DIAGNOSIS — I10 Essential (primary) hypertension: Secondary | ICD-10-CM | POA: Diagnosis not present

## 2021-02-06 DIAGNOSIS — E039 Hypothyroidism, unspecified: Secondary | ICD-10-CM | POA: Diagnosis not present

## 2021-02-06 DIAGNOSIS — I2510 Atherosclerotic heart disease of native coronary artery without angina pectoris: Secondary | ICD-10-CM | POA: Diagnosis not present

## 2021-02-06 DIAGNOSIS — I639 Cerebral infarction, unspecified: Secondary | ICD-10-CM | POA: Diagnosis not present

## 2021-02-06 DIAGNOSIS — J449 Chronic obstructive pulmonary disease, unspecified: Secondary | ICD-10-CM | POA: Diagnosis not present

## 2021-02-06 DIAGNOSIS — E1022 Type 1 diabetes mellitus with diabetic chronic kidney disease: Secondary | ICD-10-CM | POA: Diagnosis not present

## 2021-02-06 DIAGNOSIS — N184 Chronic kidney disease, stage 4 (severe): Secondary | ICD-10-CM | POA: Diagnosis not present

## 2021-02-07 DIAGNOSIS — N2581 Secondary hyperparathyroidism of renal origin: Secondary | ICD-10-CM | POA: Diagnosis not present

## 2021-02-07 DIAGNOSIS — Z992 Dependence on renal dialysis: Secondary | ICD-10-CM | POA: Diagnosis not present

## 2021-02-07 DIAGNOSIS — N186 End stage renal disease: Secondary | ICD-10-CM | POA: Diagnosis not present

## 2021-02-10 DIAGNOSIS — Z992 Dependence on renal dialysis: Secondary | ICD-10-CM | POA: Diagnosis not present

## 2021-02-10 DIAGNOSIS — N2581 Secondary hyperparathyroidism of renal origin: Secondary | ICD-10-CM | POA: Diagnosis not present

## 2021-02-10 DIAGNOSIS — N186 End stage renal disease: Secondary | ICD-10-CM | POA: Diagnosis not present

## 2021-02-11 DIAGNOSIS — E7800 Pure hypercholesterolemia, unspecified: Secondary | ICD-10-CM | POA: Diagnosis not present

## 2021-02-11 DIAGNOSIS — Z7902 Long term (current) use of antithrombotics/antiplatelets: Secondary | ICD-10-CM | POA: Diagnosis not present

## 2021-02-11 DIAGNOSIS — E1040 Type 1 diabetes mellitus with diabetic neuropathy, unspecified: Secondary | ICD-10-CM | POA: Diagnosis not present

## 2021-02-11 DIAGNOSIS — I2510 Atherosclerotic heart disease of native coronary artery without angina pectoris: Secondary | ICD-10-CM | POA: Diagnosis not present

## 2021-02-11 DIAGNOSIS — I5022 Chronic systolic (congestive) heart failure: Secondary | ICD-10-CM | POA: Diagnosis not present

## 2021-02-11 DIAGNOSIS — I132 Hypertensive heart and chronic kidney disease with heart failure and with stage 5 chronic kidney disease, or end stage renal disease: Secondary | ICD-10-CM | POA: Diagnosis not present

## 2021-02-11 DIAGNOSIS — E1022 Type 1 diabetes mellitus with diabetic chronic kidney disease: Secondary | ICD-10-CM | POA: Diagnosis not present

## 2021-02-11 DIAGNOSIS — Z87891 Personal history of nicotine dependence: Secondary | ICD-10-CM | POA: Diagnosis not present

## 2021-02-11 DIAGNOSIS — N186 End stage renal disease: Secondary | ICD-10-CM | POA: Diagnosis not present

## 2021-02-12 DIAGNOSIS — N186 End stage renal disease: Secondary | ICD-10-CM | POA: Diagnosis not present

## 2021-02-12 DIAGNOSIS — N2581 Secondary hyperparathyroidism of renal origin: Secondary | ICD-10-CM | POA: Diagnosis not present

## 2021-02-12 DIAGNOSIS — Z992 Dependence on renal dialysis: Secondary | ICD-10-CM | POA: Diagnosis not present

## 2021-02-14 DIAGNOSIS — Z992 Dependence on renal dialysis: Secondary | ICD-10-CM | POA: Diagnosis not present

## 2021-02-14 DIAGNOSIS — N2581 Secondary hyperparathyroidism of renal origin: Secondary | ICD-10-CM | POA: Diagnosis not present

## 2021-02-14 DIAGNOSIS — N186 End stage renal disease: Secondary | ICD-10-CM | POA: Diagnosis not present

## 2021-02-17 DIAGNOSIS — N2581 Secondary hyperparathyroidism of renal origin: Secondary | ICD-10-CM | POA: Diagnosis not present

## 2021-02-17 DIAGNOSIS — Z992 Dependence on renal dialysis: Secondary | ICD-10-CM | POA: Diagnosis not present

## 2021-02-17 DIAGNOSIS — N186 End stage renal disease: Secondary | ICD-10-CM | POA: Diagnosis not present

## 2021-02-18 DIAGNOSIS — N186 End stage renal disease: Secondary | ICD-10-CM | POA: Diagnosis not present

## 2021-02-18 DIAGNOSIS — I509 Heart failure, unspecified: Secondary | ICD-10-CM | POA: Diagnosis not present

## 2021-02-18 DIAGNOSIS — M1990 Unspecified osteoarthritis, unspecified site: Secondary | ICD-10-CM | POA: Diagnosis not present

## 2021-02-18 DIAGNOSIS — Z6827 Body mass index (BMI) 27.0-27.9, adult: Secondary | ICD-10-CM | POA: Diagnosis not present

## 2021-02-18 DIAGNOSIS — E785 Hyperlipidemia, unspecified: Secondary | ICD-10-CM | POA: Diagnosis not present

## 2021-02-18 DIAGNOSIS — I132 Hypertensive heart and chronic kidney disease with heart failure and with stage 5 chronic kidney disease, or end stage renal disease: Secondary | ICD-10-CM | POA: Diagnosis not present

## 2021-02-18 DIAGNOSIS — I2510 Atherosclerotic heart disease of native coronary artery without angina pectoris: Secondary | ICD-10-CM | POA: Diagnosis not present

## 2021-02-18 DIAGNOSIS — F419 Anxiety disorder, unspecified: Secondary | ICD-10-CM | POA: Diagnosis not present

## 2021-02-18 DIAGNOSIS — I69354 Hemiplegia and hemiparesis following cerebral infarction affecting left non-dominant side: Secondary | ICD-10-CM | POA: Diagnosis not present

## 2021-02-18 DIAGNOSIS — Z992 Dependence on renal dialysis: Secondary | ICD-10-CM | POA: Diagnosis not present

## 2021-02-18 DIAGNOSIS — I252 Old myocardial infarction: Secondary | ICD-10-CM | POA: Diagnosis not present

## 2021-02-18 DIAGNOSIS — E1122 Type 2 diabetes mellitus with diabetic chronic kidney disease: Secondary | ICD-10-CM | POA: Diagnosis not present

## 2021-02-18 DIAGNOSIS — Z794 Long term (current) use of insulin: Secondary | ICD-10-CM | POA: Diagnosis not present

## 2021-02-18 DIAGNOSIS — G8929 Other chronic pain: Secondary | ICD-10-CM | POA: Diagnosis not present

## 2021-02-18 DIAGNOSIS — F325 Major depressive disorder, single episode, in full remission: Secondary | ICD-10-CM | POA: Diagnosis not present

## 2021-02-18 DIAGNOSIS — J449 Chronic obstructive pulmonary disease, unspecified: Secondary | ICD-10-CM | POA: Diagnosis not present

## 2021-02-18 DIAGNOSIS — Z7902 Long term (current) use of antithrombotics/antiplatelets: Secondary | ICD-10-CM | POA: Diagnosis not present

## 2021-02-18 DIAGNOSIS — Z7722 Contact with and (suspected) exposure to environmental tobacco smoke (acute) (chronic): Secondary | ICD-10-CM | POA: Diagnosis not present

## 2021-02-19 DIAGNOSIS — N2581 Secondary hyperparathyroidism of renal origin: Secondary | ICD-10-CM | POA: Diagnosis not present

## 2021-02-19 DIAGNOSIS — Z992 Dependence on renal dialysis: Secondary | ICD-10-CM | POA: Diagnosis not present

## 2021-02-19 DIAGNOSIS — N186 End stage renal disease: Secondary | ICD-10-CM | POA: Diagnosis not present

## 2021-02-21 DIAGNOSIS — N186 End stage renal disease: Secondary | ICD-10-CM | POA: Diagnosis not present

## 2021-02-21 DIAGNOSIS — Z992 Dependence on renal dialysis: Secondary | ICD-10-CM | POA: Diagnosis not present

## 2021-02-21 DIAGNOSIS — N2581 Secondary hyperparathyroidism of renal origin: Secondary | ICD-10-CM | POA: Diagnosis not present

## 2021-02-24 DIAGNOSIS — N2581 Secondary hyperparathyroidism of renal origin: Secondary | ICD-10-CM | POA: Diagnosis not present

## 2021-02-24 DIAGNOSIS — N186 End stage renal disease: Secondary | ICD-10-CM | POA: Diagnosis not present

## 2021-02-24 DIAGNOSIS — Z992 Dependence on renal dialysis: Secondary | ICD-10-CM | POA: Diagnosis not present

## 2021-02-25 DIAGNOSIS — Z992 Dependence on renal dialysis: Secondary | ICD-10-CM | POA: Diagnosis not present

## 2021-02-25 DIAGNOSIS — N186 End stage renal disease: Secondary | ICD-10-CM | POA: Diagnosis not present

## 2021-02-25 DIAGNOSIS — I6502 Occlusion and stenosis of left vertebral artery: Secondary | ICD-10-CM | POA: Diagnosis not present

## 2021-02-25 DIAGNOSIS — G8918 Other acute postprocedural pain: Secondary | ICD-10-CM | POA: Diagnosis not present

## 2021-02-25 DIAGNOSIS — I5042 Chronic combined systolic (congestive) and diastolic (congestive) heart failure: Secondary | ICD-10-CM | POA: Diagnosis not present

## 2021-02-25 DIAGNOSIS — E669 Obesity, unspecified: Secondary | ICD-10-CM | POA: Diagnosis not present

## 2021-02-25 DIAGNOSIS — E1022 Type 1 diabetes mellitus with diabetic chronic kidney disease: Secondary | ICD-10-CM | POA: Diagnosis not present

## 2021-02-25 DIAGNOSIS — I6523 Occlusion and stenosis of bilateral carotid arteries: Secondary | ICD-10-CM | POA: Diagnosis not present

## 2021-02-25 DIAGNOSIS — I132 Hypertensive heart and chronic kidney disease with heart failure and with stage 5 chronic kidney disease, or end stage renal disease: Secondary | ICD-10-CM | POA: Diagnosis not present

## 2021-02-25 DIAGNOSIS — I2510 Atherosclerotic heart disease of native coronary artery without angina pectoris: Secondary | ICD-10-CM | POA: Diagnosis not present

## 2021-02-25 DIAGNOSIS — Z794 Long term (current) use of insulin: Secondary | ICD-10-CM | POA: Diagnosis not present

## 2021-02-28 DIAGNOSIS — Z992 Dependence on renal dialysis: Secondary | ICD-10-CM | POA: Diagnosis not present

## 2021-02-28 DIAGNOSIS — N186 End stage renal disease: Secondary | ICD-10-CM | POA: Diagnosis not present

## 2021-02-28 DIAGNOSIS — N2581 Secondary hyperparathyroidism of renal origin: Secondary | ICD-10-CM | POA: Diagnosis not present

## 2021-03-04 ENCOUNTER — Emergency Department: Payer: Medicare HMO

## 2021-03-04 ENCOUNTER — Observation Stay
Admission: EM | Admit: 2021-03-04 | Discharge: 2021-03-05 | Disposition: A | Discharge status: Home / Self Care | Payer: Medicare HMO | Attending: Emergency Medicine | Admitting: Emergency Medicine

## 2021-03-04 ENCOUNTER — Other Ambulatory Visit: Payer: Self-pay

## 2021-03-04 DIAGNOSIS — Z992 Dependence on renal dialysis: Secondary | ICD-10-CM | POA: Diagnosis not present

## 2021-03-04 DIAGNOSIS — N186 End stage renal disease: Secondary | ICD-10-CM | POA: Diagnosis not present

## 2021-03-04 DIAGNOSIS — Z794 Long term (current) use of insulin: Secondary | ICD-10-CM | POA: Diagnosis not present

## 2021-03-04 DIAGNOSIS — E875 Hyperkalemia: Secondary | ICD-10-CM | POA: Diagnosis not present

## 2021-03-04 DIAGNOSIS — I5032 Chronic diastolic (congestive) heart failure: Secondary | ICD-10-CM | POA: Diagnosis not present

## 2021-03-04 DIAGNOSIS — Z8616 Personal history of COVID-19: Secondary | ICD-10-CM | POA: Diagnosis not present

## 2021-03-04 DIAGNOSIS — Z20822 Contact with and (suspected) exposure to covid-19: Secondary | ICD-10-CM | POA: Insufficient documentation

## 2021-03-04 DIAGNOSIS — I10 Essential (primary) hypertension: Secondary | ICD-10-CM | POA: Diagnosis not present

## 2021-03-04 DIAGNOSIS — E785 Hyperlipidemia, unspecified: Secondary | ICD-10-CM

## 2021-03-04 DIAGNOSIS — D631 Anemia in chronic kidney disease: Secondary | ICD-10-CM | POA: Diagnosis not present

## 2021-03-04 DIAGNOSIS — Z743 Need for continuous supervision: Secondary | ICD-10-CM | POA: Diagnosis not present

## 2021-03-04 DIAGNOSIS — Z7982 Long term (current) use of aspirin: Secondary | ICD-10-CM | POA: Insufficient documentation

## 2021-03-04 DIAGNOSIS — R739 Hyperglycemia, unspecified: Secondary | ICD-10-CM | POA: Diagnosis not present

## 2021-03-04 DIAGNOSIS — F32A Depression, unspecified: Secondary | ICD-10-CM | POA: Diagnosis present

## 2021-03-04 DIAGNOSIS — E119 Type 2 diabetes mellitus without complications: Secondary | ICD-10-CM

## 2021-03-04 DIAGNOSIS — E162 Hypoglycemia, unspecified: Secondary | ICD-10-CM | POA: Diagnosis not present

## 2021-03-04 DIAGNOSIS — N184 Chronic kidney disease, stage 4 (severe): Secondary | ICD-10-CM

## 2021-03-04 DIAGNOSIS — J449 Chronic obstructive pulmonary disease, unspecified: Secondary | ICD-10-CM | POA: Diagnosis not present

## 2021-03-04 DIAGNOSIS — I5033 Acute on chronic diastolic (congestive) heart failure: Secondary | ICD-10-CM | POA: Insufficient documentation

## 2021-03-04 DIAGNOSIS — Z87891 Personal history of nicotine dependence: Secondary | ICD-10-CM | POA: Insufficient documentation

## 2021-03-04 DIAGNOSIS — E161 Other hypoglycemia: Secondary | ICD-10-CM | POA: Diagnosis not present

## 2021-03-04 DIAGNOSIS — I2510 Atherosclerotic heart disease of native coronary artery without angina pectoris: Secondary | ICD-10-CM | POA: Diagnosis not present

## 2021-03-04 DIAGNOSIS — I132 Hypertensive heart and chronic kidney disease with heart failure and with stage 5 chronic kidney disease, or end stage renal disease: Secondary | ICD-10-CM | POA: Diagnosis not present

## 2021-03-04 DIAGNOSIS — E1021 Type 1 diabetes mellitus with diabetic nephropathy: Secondary | ICD-10-CM | POA: Diagnosis not present

## 2021-03-04 DIAGNOSIS — R531 Weakness: Secondary | ICD-10-CM | POA: Diagnosis not present

## 2021-03-04 DIAGNOSIS — Z79899 Other long term (current) drug therapy: Secondary | ICD-10-CM | POA: Diagnosis not present

## 2021-03-04 DIAGNOSIS — E039 Hypothyroidism, unspecified: Secondary | ICD-10-CM | POA: Diagnosis not present

## 2021-03-04 DIAGNOSIS — I129 Hypertensive chronic kidney disease with stage 1 through stage 4 chronic kidney disease, or unspecified chronic kidney disease: Secondary | ICD-10-CM

## 2021-03-04 DIAGNOSIS — N1832 Chronic kidney disease, stage 3b: Secondary | ICD-10-CM | POA: Diagnosis not present

## 2021-03-04 DIAGNOSIS — R404 Transient alteration of awareness: Secondary | ICD-10-CM | POA: Diagnosis not present

## 2021-03-04 DIAGNOSIS — E1122 Type 2 diabetes mellitus with diabetic chronic kidney disease: Secondary | ICD-10-CM | POA: Diagnosis not present

## 2021-03-04 DIAGNOSIS — R58 Hemorrhage, not elsewhere classified: Secondary | ICD-10-CM | POA: Diagnosis not present

## 2021-03-04 DIAGNOSIS — R600 Localized edema: Secondary | ICD-10-CM

## 2021-03-04 LAB — CBC
HCT: 31.9 % — ABNORMAL LOW (ref 39.0–52.0)
Hemoglobin: 10.9 g/dL — ABNORMAL LOW (ref 13.0–17.0)
MCH: 33.9 pg (ref 26.0–34.0)
MCHC: 34.2 g/dL (ref 30.0–36.0)
MCV: 99.1 fL (ref 80.0–100.0)
Platelets: 214 10*3/uL (ref 150–400)
RBC: 3.22 MIL/uL — ABNORMAL LOW (ref 4.22–5.81)
RDW: 15.5 % (ref 11.5–15.5)
WBC: 8.8 10*3/uL (ref 4.0–10.5)
nRBC: 0 % (ref 0.0–0.2)

## 2021-03-04 LAB — BASIC METABOLIC PANEL
Anion gap: 14 (ref 5–15)
Anion gap: 10 (ref 5–15)
BUN: 68 mg/dL — ABNORMAL HIGH (ref 8–23)
BUN: 32 mg/dL — ABNORMAL HIGH (ref 8–23)
CO2: 26 mmol/L (ref 22–32)
CO2: 31 mmol/L (ref 22–32)
Calcium: 8.6 mg/dL — ABNORMAL LOW (ref 8.9–10.3)
Calcium: 8.4 mg/dL — ABNORMAL LOW (ref 8.9–10.3)
Chloride: 95 mmol/L — ABNORMAL LOW (ref 98–111)
Chloride: 95 mmol/L — ABNORMAL LOW (ref 98–111)
Creatinine, Ser: 7.03 mg/dL — ABNORMAL HIGH (ref 0.61–1.24)
Creatinine, Ser: 4.27 mg/dL — ABNORMAL HIGH (ref 0.61–1.24)
GFR, Estimated: 8 mL/min — ABNORMAL LOW (ref >60)
GFR, Estimated: 14 mL/min — ABNORMAL LOW (ref >60)
Glucose, Bld: 236 mg/dL — ABNORMAL HIGH (ref 70–99)
Glucose, Bld: 165 mg/dL — ABNORMAL HIGH (ref 70–99)
Potassium: 6 mmol/L — ABNORMAL HIGH (ref 3.5–5.1)
Potassium: 3.9 mmol/L (ref 3.5–5.1)
Sodium: 135 mmol/L (ref 135–145)
Sodium: 136 mmol/L (ref 135–145)

## 2021-03-04 LAB — RESP PANEL BY RT-PCR (FLU A&B, COVID) ARPGX2
Influenza A by PCR: NEGATIVE
Influenza B by PCR: NEGATIVE
SARS Coronavirus 2 by RT PCR: NEGATIVE

## 2021-03-04 LAB — GLUCOSE, CAPILLARY: Glucose-Capillary: 170 mg/dL — ABNORMAL HIGH (ref 70–99)

## 2021-03-04 MED ORDER — ALBUTEROL SULFATE (2.5 MG/3ML) 0.083% IN NEBU: 2.0000 mg | INHALATION_SOLUTION | RESPIRATORY_TRACT | Status: DC | PRN

## 2021-03-04 MED ORDER — INSULIN ASPART 100 UNIT/ML IJ SOLN: 0.0000 [IU] | Freq: Every day | INTRAMUSCULAR | Status: DC

## 2021-03-04 MED ORDER — SIMETHICONE 80 MG PO CHEW
160.0000 mg | CHEWABLE_TABLET | Freq: Once | ORAL | Status: AC
  Administered 2021-03-04: 160 mg via ORAL
  Filled 2021-03-04: qty 2

## 2021-03-04 MED ORDER — VITAMIN D 25 MCG (1000 UNIT) PO TABS
1000.0000 [IU] | ORAL_TABLET | Freq: Every day | ORAL | Status: DC
  Administered 2021-03-04 – 2021-03-05 (×2): 1000 [IU] via ORAL
  Filled 2021-03-04 (×2): qty 1

## 2021-03-04 MED ORDER — PENTOXIFYLLINE ER 400 MG PO TBCR: 400.0000 mg | EXTENDED_RELEASE_TABLET | Freq: Three times a day (TID) | ORAL | Status: DC

## 2021-03-04 MED ORDER — DEXTROSE 50 % IV SOLN
1.0000 | Freq: Once | INTRAVENOUS | Status: AC
  Administered 2021-03-04: 50 mL via INTRAVENOUS
  Filled 2021-03-04: qty 50

## 2021-03-04 MED ORDER — CLOPIDOGREL BISULFATE 75 MG PO TABS
75.0000 mg | ORAL_TABLET | Freq: Every day | ORAL | Status: DC
  Administered 2021-03-04 – 2021-03-05 (×2): 75 mg via ORAL
  Filled 2021-03-04 (×2): qty 1

## 2021-03-04 MED ORDER — SEVELAMER CARBONATE 800 MG PO TABS: 2400.0000 mg | ORAL_TABLET | Freq: Three times a day (TID) | ORAL | Status: DC

## 2021-03-04 MED ORDER — HEPARIN SODIUM (PORCINE) 5000 UNIT/ML IJ SOLN
5000.0000 [IU] | Freq: Three times a day (TID) | INTRAMUSCULAR | Status: DC
  Administered 2021-03-04 – 2021-03-05 (×2): 5000 [IU] via SUBCUTANEOUS
  Filled 2021-03-04 (×2): qty 1

## 2021-03-04 MED ORDER — INSULIN ASPART 100 UNIT/ML IJ SOLN
INTRAMUSCULAR | Status: AC
  Administered 2021-03-04: 10 [IU] via INTRAVENOUS
  Filled 2021-03-04: qty 1

## 2021-03-04 MED ORDER — ATORVASTATIN CALCIUM 80 MG PO TABS
80.0000 mg | ORAL_TABLET | Freq: Every day | ORAL | Status: DC
  Administered 2021-03-04 – 2021-03-05 (×2): 80 mg via ORAL
  Filled 2021-03-04 (×2): qty 1

## 2021-03-04 MED ORDER — CINACALCET HCL 30 MG PO TABS
30.0000 mg | ORAL_TABLET | Freq: Every day | ORAL | Status: DC
  Filled 2021-03-04: qty 1

## 2021-03-04 MED ORDER — INSULIN ASPART 100 UNIT/ML IV SOLN
10.0000 [IU] | Freq: Once | INTRAVENOUS | Status: AC
  Filled 2021-03-04: qty 0.1

## 2021-03-04 MED ORDER — HYDRALAZINE HCL 20 MG/ML IJ SOLN: 5.0000 mg | INTRAMUSCULAR | Status: DC | PRN

## 2021-03-04 MED ORDER — PENTOXIFYLLINE ER 400 MG PO TBCR
400.0000 mg | EXTENDED_RELEASE_TABLET | Freq: Every day | ORAL | Status: DC
  Administered 2021-03-05: 400 mg via ORAL
  Filled 2021-03-04: qty 1

## 2021-03-04 MED ORDER — SODIUM BICARBONATE 8.4 % IV SOLN
50.0000 meq | Freq: Once | INTRAVENOUS | Status: AC
  Administered 2021-03-04: 50 meq via INTRAVENOUS
  Filled 2021-03-04: qty 50

## 2021-03-04 MED ORDER — INSULIN DETEMIR 100 UNIT/ML ~~LOC~~ SOLN
10.0000 [IU] | Freq: Every day | SUBCUTANEOUS | Status: DC
  Administered 2021-03-04: 10 [IU] via SUBCUTANEOUS
  Filled 2021-03-04 (×2): qty 0.1

## 2021-03-04 MED ORDER — DULOXETINE HCL 30 MG PO CPEP
30.0000 mg | ORAL_CAPSULE | Freq: Every day | ORAL | Status: DC
  Administered 2021-03-04 – 2021-03-05 (×2): 30 mg via ORAL
  Filled 2021-03-04 (×2): qty 1

## 2021-03-04 MED ORDER — RENA-VITE PO TABS
1.0000 | ORAL_TABLET | Freq: Every day | ORAL | Status: DC
  Administered 2021-03-04: 1 via ORAL
  Filled 2021-03-04 (×2): qty 1

## 2021-03-04 MED ORDER — LEVOTHYROXINE SODIUM 100 MCG PO TABS
100.0000 ug | ORAL_TABLET | Freq: Every day | ORAL | Status: DC
  Administered 2021-03-05: 100 ug via ORAL
  Filled 2021-03-04: qty 1

## 2021-03-04 MED ORDER — TORSEMIDE 20 MG PO TABS
40.0000 mg | ORAL_TABLET | Freq: Every day | ORAL | Status: DC
  Administered 2021-03-04 – 2021-03-05 (×2): 40 mg via ORAL
  Filled 2021-03-04 (×2): qty 2

## 2021-03-04 MED ORDER — CHLORHEXIDINE GLUCONATE CLOTH 2 % EX PADS
6.0000 | MEDICATED_PAD | Freq: Every day | CUTANEOUS | Status: DC
  Administered 2021-03-05: 6 via TOPICAL
  Filled 2021-03-04: qty 6

## 2021-03-04 MED ORDER — DM-GUAIFENESIN ER 30-600 MG PO TB12
1.0000 | ORAL_TABLET | Freq: Two times a day (BID) | ORAL | Status: DC | PRN
Start: 2021-03-04 — End: 2021-03-05

## 2021-03-04 MED ORDER — ACETAMINOPHEN 325 MG PO TABS: 650.0000 mg | ORAL_TABLET | Freq: Four times a day (QID) | ORAL | Status: DC | PRN

## 2021-03-04 MED ORDER — INSULIN NPH (HUMAN) (ISOPHANE) 100 UNIT/ML ~~LOC~~ SUSP: 5.0000 [IU] | Freq: Two times a day (BID) | SUBCUTANEOUS | Status: DC

## 2021-03-04 MED ORDER — VALACYCLOVIR HCL 500 MG PO TABS
1000.0000 mg | ORAL_TABLET | Freq: Every day | ORAL | Status: DC
  Administered 2021-03-04 – 2021-03-05 (×2): 1000 mg via ORAL
  Filled 2021-03-04 (×2): qty 2

## 2021-03-04 MED ORDER — TRAMADOL HCL 50 MG PO TABS
50.0000 mg | ORAL_TABLET | Freq: Two times a day (BID) | ORAL | Status: DC | PRN
  Administered 2021-03-04: 50 mg via ORAL
  Filled 2021-03-04: qty 1

## 2021-03-04 MED ORDER — CARVEDILOL 25 MG PO TABS
25.0000 mg | ORAL_TABLET | Freq: Two times a day (BID) | ORAL | Status: DC
  Administered 2021-03-04 – 2021-03-05 (×2): 25 mg via ORAL
  Filled 2021-03-04 (×2): qty 1

## 2021-03-04 MED ORDER — INSULIN ASPART 100 UNIT/ML IJ SOLN
0.0000 [IU] | Freq: Three times a day (TID) | INTRAMUSCULAR | Status: DC
  Administered 2021-03-05: 1 [IU] via SUBCUTANEOUS
  Filled 2021-03-04: qty 1

## 2021-03-04 MED ORDER — CALCIUM GLUCONATE 10 % IV SOLN
1.0000 g | Freq: Once | INTRAVENOUS | Status: AC
  Administered 2021-03-04: 1 g via INTRAVENOUS
  Filled 2021-03-04: qty 10

## 2021-03-04 MED ORDER — ONDANSETRON HCL 4 MG/2ML IJ SOLN: 4.0000 mg | Freq: Three times a day (TID) | INTRAMUSCULAR | Status: DC | PRN

## 2021-03-04 NOTE — ED Provider Notes (Signed)
[facility] Emergency Department Provider Note  ____________________________________________  Time seen: Approximately 3:09 PM  I have reviewed the triage vital signs and the nursing notes.   HISTORY  Chief Complaint Weakness    HPI Bryce Garcia is a 70 y.o. male with a history of CAD CHF COPD and ESRD on hemodialysis Tuesday Thursday Saturday who comes ED complaining of generalized weakness for the past 4 days.  Last went to dialysis 4 days ago.  Did not go to his dialysis yesterday.  Denies vomiting chest pain or shortness of breath.  No diarrhea.  No palpitations dizziness or syncope.  Symptoms are constant without aggravating or alleviating factors.      Past Medical History:  Diagnosis Date   CAD (coronary artery disease)    CHF (congestive heart failure) (HCC)    CKD (chronic kidney disease), stage III (HCC)    COPD (chronic obstructive pulmonary disease) ([place])    COVID-19    Diabetes mellitus without complication (HCC)    HFrEF (heart failure with reduced ejection fraction) (HCC)    LBBB (left bundle branch block)    Venous ulcer ([place])      Patient Active Problem List   Diagnosis Date Noted   Acute metabolic encephalopathy due to hypoglycemia 12/16/2020   Chronic diastolic CHF (congestive heart failure) ([place]) 12/15/2020   Type II diabetes mellitus with renal manifestations ([place]) [id number]   Acute metabolic encephalopathy [id number]   Hypoglycemia 12/15/2020   Acute on chronic respiratory failure with hypoxia ([place]) 12/15/2020   Sepsis ([place]) 12/15/2020   HLD (hyperlipidemia) 12/15/2020   HTN (hypertension) 12/15/2020   Hypothyroidism 12/15/2020   Depression 12/15/2020   CAD (coronary artery disease) 12/15/2020   ESRD (end stage renal disease) on dialysis ([place]) 12/15/2020   Hypothermia 12/15/2020   HCAP (healthcare-associated pneumonia) 12/15/2020   Lobar pneumonia ([place])    COPD with acute exacerbation (HCC)    Blurred vision,  bilateral    Anemia of chronic kidney failure, stage 4 (severe) ([place]) 07/22/2019   Recurrent pneumonia 07/22/2019   Type 2 DM with CKD stage 4 and hypertension ([place]) 07/22/2019   Acute on chronic diastolic CHF (congestive heart failure) ([place]) 07/22/2019   Acute on chronic heart failure with preserved ejection fraction (HFpEF) (HCC)    Acute renal failure with acute renal cortical necrosis superimposed on stage 3 chronic kidney disease ([place])    Acute respiratory failure with hypoxia ([place]) 06/21/2019   Venous ulcer of left leg ([place]) 06/21/2019   COPD with chronic bronchitis ([place]) 06/21/2019   CKD (chronic kidney disease) stage 4, GFR 15-29 ml/min ([place]) 06/21/2019   AKI (acute kidney injury) ([place]) [id number]   Chronic systolic CHF (congestive heart failure) ([place]) 06/21/2019   History of 2019 novel coronavirus disease (COVID-19) 06/21/2019   Anemia 06/21/2019   Acute respiratory failure ([place]) 06/21/2019   Hyperglycemia due to type 1 diabetes mellitus ([place]) 06/21/2019   Hx of CABG 06/21/2019   Non-ST elevation (NSTEMI) myocardial infarction ([place]) 06/21/2019     History reviewed. No pertinent surgical history.   Prior to Admission medications   Medication Sig Start Date End Date Taking? Authorizing Provider  albuterol (VENTOLIN HFA) 108 (90 Base) MCG/ACT inhaler Inhale 2 puffs into the lungs every 4 (four) hours as needed for wheezing. 05/12/18   [provider]  aspirin 81 MG EC tablet Take by mouth. 12/08/06   [provider]  atorvastatin (LIPITOR) 80 MG tablet Take 80 mg by mouth daily. 04/30/19   [provider]  carvedilol (COREG) 25 MG tablet Take 25 mg by mouth 2 (two) times daily. 04/05/19   [provider]  clopidogrel (PLAVIX) 75 MG tablet Take 75 mg by mouth daily. 04/30/19   [provider]  DULoxetine (CYMBALTA) 30 MG capsule Take 30 mg by mouth daily. 05/22/19   [provider]  EUTHYROX 100 MCG tablet Take 100 mcg by mouth  daily. 03/18/19   [provider]  fluocinonide cream (LIDEX) [id number] % Apply 1 application topically 2 (two) times daily. For rash on legs. 10/24/13   [provider]  insulin NPH Human (NOVOLIN N) 100 UNIT/ML injection Inject 5 units sq qam and 19 units sq q hs, adjust as directed up to 30 units daily 04/17/18   [provider]  insulin regular (NOVOLIN R) 100 units/mL injection Inject 5-20 Units into the skin 3 (three) times daily before meals. Depending on blood glucose level 04/17/18   [provider]  ipratropium (ATROVENT HFA) 17 MCG/ACT inhaler Inhale 2 puffs into the lungs every 6 (six) hours as needed for wheezing. 07/25/19   Thornell Mule, MD  pentoxifylline (TRENTAL) 400 MG CR tablet Take 400 mg by mouth 3 (three) times daily. 06/12/19   [provider]  torsemide (DEMADEX) 20 MG tablet Take 2 tablets (40 mg total) by mouth daily. 06/29/19   Samuella Cota, MD  traMADol (ULTRAM) 50 MG tablet Take 50 mg by mouth every 6 (six) hours as needed. 05/17/19   [provider]     Allergies Other and Amlodipine   Family History  Problem Relation Age of Onset   Diabetes Mother    Hypertension Mother    Hyperlipidemia Mother    Cancer Father     Social History Social History   Tobacco Use   Smoking status: Former   Smokeless tobacco: Never  Substance Use Topics   Alcohol use: Not Currently   Drug use: Never    Review of Systems  Constitutional:   No fever or chills.  ENT:   No sore throat. No rhinorrhea. Cardiovascular:   No chest pain or syncope. Respiratory:   No dyspnea or cough. Gastrointestinal:   Negative for abdominal pain, vomiting and diarrhea.  Musculoskeletal:   Negative for focal pain or swelling All other systems reviewed and are negative except as documented above in ROS and HPI.  ____________________________________________   PHYSICAL EXAM:  VITAL SIGNS: ED Triage Vitals  Enc Vitals Group     BP  03/04/21 1326 135/71     Pulse Rate 03/04/21 1326 76     Resp 03/04/21 1326 18     Temp 03/04/21 1326 99.1 F (37.3 C)     Temp Source 03/04/21 1326 Oral     SpO2 03/04/21 1326 98 %     Weight 03/04/21 1326 183 lb (83 kg)     Height 03/04/21 1326 '5\' 8"'$  (1.727 m)     Head Circumference --      Peak Flow --      Pain Score 03/04/21 1333 0     Pain Loc --      Pain Edu? --      Excl. in [place]? --     Vital signs reviewed, nursing assessments reviewed.   Constitutional:   Alert and oriented. Non-toxic appearance. Eyes:   Conjunctivae are normal. EOMI. PERRL. ENT      Head:   Normocephalic and atraumatic.      Nose:   Wearing a mask.  Mouth/Throat:   Wearing a mask.      Neck:   No meningismus. Full ROM. Hematological/Lymphatic/Immunilogical:   No cervical lymphadenopathy. Cardiovascular:   RRR.   No murmurs. Cap refill less than 2 seconds.  Tunneled dialysis catheter overlying right chest.  Recent surgical placement of left upper extremity AV fistula with thrill, clean incision, soft tissues, intact distal perfusion and radial pulse. Respiratory:   Normal respiratory effort without tachypnea/retractions. Breath sounds are clear and equal bilaterally. No wheezes/rales/rhonchi. Gastrointestinal:   Soft and nontender. Non distended. There is no CVA tenderness.  No rebound, rigidity, or guarding. Genitourinary:   deferred Musculoskeletal:   Normal range of motion in all extremities. No joint effusions.  No lower extremity tenderness.  No edema. Neurologic:   Normal speech and language.  Motor grossly intact. No acute focal neurologic deficits are appreciated.  Skin:    Skin is warm, dry and intact. No rash noted.  No petechiae, purpura, or bullae.  ____________________________________________    LABS (pertinent positives/negatives) (all labs ordered are listed, but only abnormal results are displayed) Labs Reviewed  BASIC METABOLIC PANEL - Abnormal; Notable for the following  components:      Result Value   Potassium 6.0 (*)    Chloride 95 (*)    Glucose, Bld 236 (*)    BUN 68 (*)    Creatinine, Ser 7.03 (*)    Calcium 8.6 (*)    GFR, Estimated 8 (*)    All other components within normal limits  CBC - Abnormal; Notable for the following components:   RBC 3.22 (*)    Hemoglobin 10.9 (*)    HCT 31.9 (*)    All other components within normal limits  RESP PANEL BY RT-PCR (FLU A&B, COVID) ARPGX2  URINALYSIS, COMPLETE (UACMP) WITH MICROSCOPIC  CBG MONITORING, ED   ____________________________________________   EKG  Interpreted by me Normal sinus rhythm rate of 78, normal axis.  Left bundle branch block with associated repolarization abnormality.  No acute ischemic changes.  Unchanged compared to previous EKG in March 2022.  ____________________________________________    RADIOLOGY  No results found.  ____________________________________________   PROCEDURES .Critical Care  Date/Time: 03/04/2021 3:13 PM Performed by: Carrie Mew, MD Authorized by: Carrie Mew, MD   Critical care provider statement:    Critical care time (minutes):  33   Critical care time was exclusive of:  Separately billable procedures and treating other patients   Critical care was necessary to treat or prevent imminent or life-threatening deterioration of the following conditions:  Renal failure, metabolic crisis and cardiac failure   Critical care was time spent personally by me on the following activities:  Development of treatment plan with patient or surrogate, discussions with consultants, evaluation of patient's response to treatment, examination of patient, obtaining history from patient or surrogate, ordering and performing treatments and interventions, ordering and review of laboratory studies, ordering and review of radiographic studies, pulse oximetry, re-evaluation of patient's condition and review of old  charts  ____________________________________________  DIFFERENTIAL DIAGNOSIS   Electrolyte abnormality, volume overload, viral illness, anemia  CLINICAL IMPRESSION / ASSESSMENT AND PLAN / ED COURSE  Medications ordered in the ED: Medications  insulin aspart (novoLOG) injection 10 Units (has no administration in time range)    And  dextrose 50 % solution 50 mL (has no administration in time range)  calcium gluconate inj 10% (1 g) URGENT USE ONLY! (has no administration in time range)  sodium bicarbonate injection 50 mEq (has no administration  in time range)    Pertinent labs & imaging results that were available during my care of the patient were reviewed by me and considered in my medical decision making (see chart for details).  SWAY HOGLUND was evaluated in Emergency Department on 03/04/2021 for the symptoms described in the history of present illness. He was evaluated in the context of the global COVID-19 pandemic, which necessitated consideration that the patient might be at risk for infection with the SARS-CoV-2 virus that causes COVID-19. Institutional protocols and algorithms that pertain to the evaluation of patients at risk for COVID-19 are in a state of rapid change based on information released by regulatory bodies including the CDC and federal and state organizations. These policies and algorithms were followed during the patient's care in the ED.   Patient presents with generalized weakness in the setting of a missed dialysis session.  Vital signs unremarkable, EKG unchanged.  Labs show potassium of 6.0.  Not severely anemic.Chest x-ray viewed and interpreted by me, appears unremarkable without pulmonary edema chest x-ray viewed and interpreted by me, appears unremarkable without significant pulmonary edema or pleural effusion.  No evidence of pneumonia.  Will give IV calcium gluconate, sodium bicarbonate, insulin and glucose.  Discussed with nephrology who will plan for  dialysis tonight.  Will contact hospitalist for further management.      ____________________________________________   FINAL CLINICAL IMPRESSION(S) / ED DIAGNOSES    Final diagnoses:  Generalized weakness  ESRD on hemodialysis ([place])  Hyperkalemia  Type 2 diabetes mellitus without complication, with long-term current use of insulin [facility])     ED Discharge Orders     None       Portions of this note were generated with dragon dictation software. Dictation errors may occur despite best attempts at proofreading.    Carrie Mew, MD 03/04/21 [phone]

## 2021-03-04 NOTE — ED Notes (Addendum)
Pt to ED c/o generalized weakness since 5d. See triage note. Pt began having dialysis in 4/22 with new graft on L arm and also has dialysis access on R chest. Pt missed dialysis yesterday (goes [facility]) because feeling weak. Last dialysis was Sat (4d ago). Denies CP, SOB, pain, NVD and fevers.

## 2021-03-04 NOTE — Progress Notes (Signed)
[facility]  ROUNDING NOTE   Subjective:   Bryce Garcia is a 70 y.o. male with past medical history of hypertension, hyperlipidemia, diabetes, COPD, diastolic heart failure, CAD and ESRD on dialysis. He presents to the ED with generalized weakness. He will be admitted under observation for Hyperkalemia [E87.5] .  Patient known to our practice from previous admission. He currently receives outpatient dialysis at [address], managed by [facility] physicians. He states he missed his last treatment, Tuesday, due to not feeling well. Reports "feeling bad" and fatigue over the past 3-4 days. Decreased appetite and 1-2 episodes of diarrhea. Reports shortness of breath, no cough or chest discomfort. No fever or chills.   We have been consulted to manage dialysis treatments during this admission.   Objective:  Vital signs in last 24 hours:  Temp:  [98.7 F (37.1 C)-99.1 F (37.3 C)] 98.7 F (37.1 C) (09/07 1715) Pulse Rate:  [63-78] 65 (09/07 1945) Resp:  [17-28] 28 (09/07 1945) BP: (125-155)/(56-101) 130/58 (09/07 1945) SpO2:  [97 %-98 %] 98 % (09/07 1500) Weight:  [83 kg] 83 kg (09/07 1326)  Weight change:  Filed Weights   03/04/21 1326  Weight: 83 kg    Intake/Output: No intake/output data recorded.   Intake/Output this shift:  No intake/output data recorded.  Physical Exam: General: NAD, resting on stretcher  Head: Normocephalic, atraumatic. Moist oral mucosal membranes  Eyes: Anicteric  Neck: Supple  Lungs:  Clear to auscultation, normal effort  Heart: Regular rate and rhythm  Abdomen:  Soft, nontender  Extremities:  no peripheral edema.  Neurologic: Nonfocal, moving all four extremities  Skin: No lesions  Access: Rt IJ Permcath, Lt AVF (maturing)    Basic Metabolic Panel: Recent Labs  Lab 03/04/21 1337  NA 135  K 6.0*  CL 95*  CO2 26  GLUCOSE 236*  BUN 68*  CREATININE 7.03*  CALCIUM 8.6*    Liver Function Tests: No results for  input(s): AST, ALT, ALKPHOS, BILITOT, PROT, ALBUMIN in the last 168 hours. No results for input(s): LIPASE, AMYLASE in the last 168 hours. No results for input(s): AMMONIA in the last 168 hours.  CBC: Recent Labs  Lab 03/04/21 1337  WBC 8.8  HGB 10.9*  HCT 31.9*  MCV 99.1  PLT 214    Cardiac Enzymes: No results for input(s): CKTOTAL, CKMB, CKMBINDEX, TROPONINI in the last 168 hours.  BNP: Invalid input(s): POCBNP  CBG: No results for input(s): GLUCAP in the last 168 hours.  Microbiology: Results for orders placed or performed during the hospital encounter of 03/04/21  Resp Panel by RT-PCR (Flu A&B, Covid) Nasopharyngeal Swab     Status: None   Collection Time: 03/04/21  2:57 PM   Specimen: Nasopharyngeal Swab; Nasopharyngeal(NP) swabs in vial transport medium  Result Value Ref Range Status   SARS Coronavirus 2 by RT PCR NEGATIVE NEGATIVE Final    Comment: (NOTE) SARS-CoV-2 target nucleic acids are NOT DETECTED.  The SARS-CoV-2 RNA is generally detectable in upper respiratory specimens during the acute phase of infection. The lowest concentration of SARS-CoV-2 viral copies this assay can detect is 138 copies/mL. A negative result does not preclude SARS-Cov-2 infection and should not be used as the sole basis for treatment or other patient management decisions. A negative result may occur with  improper specimen collection/handling, submission of specimen other than nasopharyngeal swab, presence of viral mutation(s) within the areas targeted by this assay, and inadequate number of viral copies(<138 copies/mL). A negative result must  be combined with clinical observations, patient history, and epidemiological information. The expected result is Negative.  Fact Sheet for Patients:  EntrepreneurPulse.com.au  Fact Sheet for Healthcare Providers:  IncredibleEmployment.be  This test is no t yet approved or cleared by the [place]  FDA and  has been authorized for detection and/or diagnosis of SARS-CoV-2 by FDA under an Emergency Use Authorization (EUA). This EUA will remain  in effect (meaning this test can be used) for the duration of the COVID-19 declaration under Section 564(b)(1) of the Act, 21 U.S.C.section 360bbb-3(b)(1), unless the authorization is terminated  or revoked sooner.       Influenza A by PCR NEGATIVE NEGATIVE Final   Influenza B by PCR NEGATIVE NEGATIVE Final    Comment: (NOTE) The Xpert Xpress SARS-CoV-2/FLU/RSV plus assay is intended as an aid in the diagnosis of influenza from Nasopharyngeal swab specimens and should not be used as a sole basis for treatment. Nasal washings and aspirates are unacceptable for Xpert Xpress SARS-CoV-2/FLU/RSV testing.  Fact Sheet for Patients: EntrepreneurPulse.com.au  Fact Sheet for Healthcare Providers: IncredibleEmployment.be  This test is not yet approved or cleared by the [place] FDA and has been authorized for detection and/or diagnosis of SARS-CoV-2 by FDA under an Emergency Use Authorization (EUA). This EUA will remain in effect (meaning this test can be used) for the duration of the COVID-19 declaration under Section 564(b)(1) of the Act, 21 U.S.C. section 360bbb-3(b)(1), unless the authorization is terminated or revoked.  Performed at [facility], [address] [place], [place] [address]     Coagulation Studies: No results for input(s): LABPROT, INR in the last 72 hours.  Urinalysis: No results for input(s): COLORURINE, LABSPEC, PHURINE, GLUCOSEU, HGBUR, BILIRUBINUR, KETONESUR, PROTEINUR, UROBILINOGEN, NITRITE, LEUKOCYTESUR in the last 72 hours.  Invalid input(s): APPERANCEUR    Imaging: DG Chest Portable 1 View  Result Date: 03/04/2021 CLINICAL DATA:  Weakness, missed dialysis. EXAM: PORTABLE CHEST 1 VIEW COMPARISON:  December 15, 2020. FINDINGS: Stable cardiomediastinal silhouette.  Right internal jugular dialysis catheter is unchanged in position. Sternotomy wires are noted. No pneumothorax or pleural effusion is noted. Increased bibasilar opacities are noted concerning for edema or possibly atelectasis. Bony thorax is unremarkable. IMPRESSION: Increased bibasilar opacities are noted concerning for edema or atelectasis. Electronically Signed   By: Marijo Conception M.D.   On: 03/04/2021 15:21     Medications:     [START ON 03/05/2021] Chlorhexidine Gluconate Cloth  6 each Topical Q0600   insulin aspart  0-5 Units Subcutaneous QHS   insulin aspart  0-9 Units Subcutaneous TID WC   acetaminophen, albuterol, dextromethorphan-guaiFENesin, hydrALAZINE, ondansetron (ZOFRAN) IV  Assessment/ Plan:  [name] is a 70 y.o.  male with past medical history of hypertension, hyperlipidemia, diabetes, COPD, diastolic heart failure, CAD and ESRD on dialysis. He presents to the ED with generalized weakness. He will be admitted under observation for Hyperkalemia [E87.5]   [facility] [address]/TTS/Rt IJ Permcath  Hyperkalemia secondary to missed dialysis treatments. Admission 6.0. Normal sinus rhythm on telemetry.Will order urgent hemodialysis with 1K/2K bath to correct this. Will monitor potassium after treatment.  End stage renal disease on dialysis: Will perform dialysis today as above. Next treatment scheduled for Thursday  3. Anemia of chronic kidney disease/ kidney injury  Lab Results  Component Value Date   HGB 10.9 (L) 03/04/2021   Hgb at target  4. Secondary Hyperparathyroidism:  Lab Results  Component Value Date   CALCIUM 8.6 (L) 03/04/2021  PHOS 4.1 06/27/2019   Calcium low, phosphorus at target Renvela with meals outpatient Will monitor during this admission  5. Hypertension Home regimen includes Carvedilol and Torsemide. Currently prescribed Hydralazine PRN.   6. Diabetes mellitus type II with chronic kidney disease/renal manifestations:  insulin/noninsulin dependent. Home regimen includes Novolog and Novolin. Most recent hemoglobin A1c is 6.8 on 06/21/19 .     LOS: 0 Hetal Proano 9/7/20228:05 PM

## 2021-03-04 NOTE — Progress Notes (Signed)
Pt. Completed HD session without incident. UF target met. Dressing changed. Pt transferred to room 259.

## 2021-03-04 NOTE — H&P (Signed)
History and Physical    Bryce Garcia [id number] DOB: December 07, 1950 DOA: 03/04/2021  Referring MD/NP/PA:   PCP: [place], [place]   Patient coming from:  The patient is coming from home.      Chief Complaint: Generalized weakness  HPI: Bryce Garcia is a 70 y.o. male with medical history significant of ESRD-HD (TTS), hypertension, hyperlipidemia, type 1 diabetes, COPD, hypothyroidism, depression, left bundle blockade, dCHF, CAD, CABG, COVID-19 infection, anemia, presents with generalized weakness.  Patient states that he has generalized weakness and has not been feeling well for about 5 days.  No unilateral numbness or tingling to extremities.  No facial droop or slurred speech.  Patient had left AVG placed last week, no worsening pain in surgical site.  No fever or chills.  Patient has mild shortness of breath, no cough, chest pain.  Patient nausea and vomited once, currently no abdominal pain or diarrhea.  No symptoms of UTI. He has dialysis port access on right upper chest. Pt missed dialysis yesterday. Last dialysis was Saturday.  ED Course: pt was found to have potassium 6.0, bicarbonate 26, creatinine 7.03, BUN 68, WBC 8.8, pending COVID-19 PCR, temperature 99.1, blood pressure 143/70, heart rate 78, RR 23, oxygen saturation 97% on room air. CXR showed increased bibasilar opacities concerning for edema or atelectasis.  Review of Systems:   General: no fevers, chills, no body weight gain, has fatigue HEENT: no blurry vision, hearing changes or sore throat Respiratory: has mild dyspnea, no coughing, wheezing CV: no chest pain, no palpitations GI: has nausea, vomiting, no abdominal pain, diarrhea, constipation GU: no dysuria, burning on urination, increased urinary frequency, hematuria  Ext: has mild leg edema Neuro: no unilateral weakness, numbness, or tingling, no vision change or hearing loss Skin: no rash, no skin tear. MSK: No muscle  spasm, no deformity, no limitation of range of movement in spin Heme: No easy bruising.  Travel history: No recent long distant travel.  Allergy:  Allergies  Allergen Reactions   Other Other (See Comments)    Hypoglycemia unawareness Hypoglycemia unawareness    Amlodipine Swelling    Past Medical History:  Diagnosis Date   CAD (coronary artery disease)    CHF (congestive heart failure) (HCC)    CKD (chronic kidney disease), stage III (HCC)    COPD (chronic obstructive pulmonary disease) ([place])    COVID-19    Diabetes mellitus without complication (HCC)    HFrEF (heart failure with reduced ejection fraction) (HCC)    LBBB (left bundle branch block)    Venous ulcer ([place])     Past Surgical History:  Procedure Laterality Date   AVG placement Left    left arm    Social History:  reports that he has quit smoking. He has never used smokeless tobacco. He reports that he does not currently use alcohol. He reports that he does not use drugs.  Family History:  Family History  Problem Relation Age of Onset   Diabetes Mother    Hypertension Mother    Hyperlipidemia Mother    Cancer Father      Prior to Admission medications   Medication Sig Start Date End Date Taking? Authorizing Provider  albuterol (VENTOLIN HFA) 108 (90 Base) MCG/ACT inhaler Inhale 2 puffs into the lungs every 4 (four) hours as needed for wheezing. 05/12/18   [provider]  aspirin 81 MG EC tablet Take by mouth. 12/08/06   [provider]  atorvastatin (LIPITOR) 80  MG tablet Take 80 mg by mouth daily. 04/30/19   [provider]  carvedilol (COREG) 25 MG tablet Take 25 mg by mouth 2 (two) times daily. 04/05/19   [provider]  clopidogrel (PLAVIX) 75 MG tablet Take 75 mg by mouth daily. 04/30/19   [provider]  DULoxetine (CYMBALTA) 30 MG capsule Take 30 mg by mouth daily. 05/22/19   [provider]  EUTHYROX 100 MCG tablet Take 100 mcg by mouth daily.  03/18/19   [provider]  fluocinonide cream (LIDEX) [id number] % Apply 1 application topically 2 (two) times daily. For rash on legs. 10/24/13   [provider]  insulin NPH Human (NOVOLIN N) 100 UNIT/ML injection Inject 5 units sq qam and 19 units sq q hs, adjust as directed up to 30 units daily 04/17/18   [provider]  insulin regular (NOVOLIN R) 100 units/mL injection Inject 5-20 Units into the skin 3 (three) times daily before meals. Depending on blood glucose level 04/17/18   [provider]  ipratropium (ATROVENT HFA) 17 MCG/ACT inhaler Inhale 2 puffs into the lungs every 6 (six) hours as needed for wheezing. 07/25/19   Thornell Mule, MD  pentoxifylline (TRENTAL) 400 MG CR tablet Take 400 mg by mouth 3 (three) times daily. 06/12/19   [provider]  torsemide (DEMADEX) 20 MG tablet Take 2 tablets (40 mg total) by mouth daily. 06/29/19   Samuella Cota, MD  traMADol (ULTRAM) 50 MG tablet Take 50 mg by mouth every 6 (six) hours as needed. 05/17/19   [provider]    Physical Exam: Vitals:   03/04/21 1326 03/04/21 1430 03/04/21 1500  BP: 135/71 (!) 143/70 (!) 125/101  Pulse: 76 78 74  Resp: 18 (!) 23 20  Temp: 99.1 F (37.3 C)    TempSrc: Oral    SpO2: 98% 97% 98%  Weight: 83 kg    Height: '5\' 8"'$  (1.727 m)     General: Not in acute distress HEENT:       Eyes: PERRL, EOMI, no scleral icterus.       ENT: No discharge from the ears and nose, no pharynx injection, no tonsillar enlargement.        Neck: No JVD, no bruit, no mass felt. Heme: No neck lymph node enlargement. Cardiac: S1/S2, RRR, No murmurs, No gallops or rubs. Respiratory: No rales, wheezing, rhonchi or rubs. GI: Soft, nondistended, nontender, no rebound pain, no organomegaly, BS present. GU: No hematuria Ext: has trace leg edema bilaterally. 1+DP/PT pulse bilaterally. Musculoskeletal: No joint deformities, No joint redness or warmth, no limitation of ROM in  spin. Skin: No rashes.  Neuro: Alert, oriented X3, cranial nerves II-XII grossly intact, moves all extremities normally.  Psych: Patient is not psychotic, no suicidal or hemocidal ideation.  Labs on Admission: I have personally reviewed following labs and imaging studies  CBC: Recent Labs  Lab 03/04/21 1337  WBC 8.8  HGB 10.9*  HCT 31.9*  MCV 99.1  PLT [id number]   Basic Metabolic Panel: Recent Labs  Lab 03/04/21 1337  NA 135  K 6.0*  CL 95*  CO2 26  GLUCOSE 236*  BUN 68*  CREATININE 7.03*  CALCIUM 8.6*   GFR: Estimated Creatinine Clearance: 10.6 mL/min (A) (by C-G formula based on SCr of 7.03 mg/dL (H)). Liver Function Tests: No results for input(s): AST, ALT, ALKPHOS, BILITOT, PROT, ALBUMIN in the last 168 hours. No results for input(s): LIPASE, AMYLASE in the last 168 hours. No  results for input(s): AMMONIA in the last 168 hours. Coagulation Profile: No results for input(s): INR, PROTIME in the last 168 hours. Cardiac Enzymes: No results for input(s): CKTOTAL, CKMB, CKMBINDEX, TROPONINI in the last 168 hours. BNP (last 3 results) No results for input(s): PROBNP in the last 8760 hours. HbA1C: No results for input(s): HGBA1C in the last 72 hours. CBG: No results for input(s): GLUCAP in the last 168 hours. Lipid Profile: No results for input(s): CHOL, HDL, LDLCALC, TRIG, CHOLHDL, LDLDIRECT in the last 72 hours. Thyroid Function Tests: No results for input(s): TSH, T4TOTAL, FREET4, T3FREE, THYROIDAB in the last 72 hours. Anemia Panel: No results for input(s): VITAMINB12, FOLATE, FERRITIN, TIBC, IRON, RETICCTPCT in the last 72 hours. Urine analysis:    Component Value Date/Time   COLORURINE STRAW (A) 12/15/2020 1820   APPEARANCEUR CLEAR (A) 12/15/2020 1820   APPEARANCEUR Clear 01/07/2014 1900   LABSPEC 1.008 12/15/2020 1820   LABSPEC 1.015 01/07/2014 1900   PHURINE 9.0 (H) 12/15/2020 1820   GLUCOSEU >=500 (A) 12/15/2020 1820   GLUCOSEU >=500 01/07/2014 1900    HGBUR NEGATIVE 12/15/2020 1820   BILIRUBINUR NEGATIVE 12/15/2020 1820   BILIRUBINUR Negative 01/07/2014 1900   KETONESUR 5 (A) 12/15/2020 1820   PROTEINUR >=300 (A) 12/15/2020 1820   NITRITE NEGATIVE 12/15/2020 1820   LEUKOCYTESUR NEGATIVE 12/15/2020 1820   LEUKOCYTESUR Negative 01/07/2014 1900   Sepsis Labs: '@LABRCNTIP'$ (procalcitonin:4,lacticidven:4) ) Recent Results (from the past 240 hour(s))  Resp Panel by RT-PCR (Flu A&B, Covid) Nasopharyngeal Swab     Status: None   Collection Time: 03/04/21  2:57 PM   Specimen: Nasopharyngeal Swab; Nasopharyngeal(NP) swabs in vial transport medium  Result Value Ref Range Status   SARS Coronavirus 2 by RT PCR NEGATIVE NEGATIVE Final    Comment: (NOTE) SARS-CoV-2 target nucleic acids are NOT DETECTED.  The SARS-CoV-2 RNA is generally detectable in upper respiratory specimens during the acute phase of infection. The lowest concentration of SARS-CoV-2 viral copies this assay can detect is 138 copies/mL. A negative result does not preclude SARS-Cov-2 infection and should not be used as the sole basis for treatment or other patient management decisions. A negative result may occur with  improper specimen collection/handling, submission of specimen other than nasopharyngeal swab, presence of viral mutation(s) within the areas targeted by this assay, and inadequate number of viral copies(<138 copies/mL). A negative result must be combined with clinical observations, patient history, and epidemiological information. The expected result is Negative.  Fact Sheet for Patients:  EntrepreneurPulse.com.au  Fact Sheet for Healthcare Providers:  IncredibleEmployment.be  This test is no t yet approved or cleared by the [place] FDA and  has been authorized for detection and/or diagnosis of SARS-CoV-2 by FDA under an Emergency Use Authorization (EUA). This EUA will remain  in effect (meaning this test can be used)  for the duration of the COVID-19 declaration under Section 564(b)(1) of the Act, 21 U.S.C.section 360bbb-3(b)(1), unless the authorization is terminated  or revoked sooner.       Influenza A by PCR NEGATIVE NEGATIVE Final   Influenza B by PCR NEGATIVE NEGATIVE Final    Comment: (NOTE) The Xpert Xpress SARS-CoV-2/FLU/RSV plus assay is intended as an aid in the diagnosis of influenza from Nasopharyngeal swab specimens and should not be used as a sole basis for treatment. Nasal washings and aspirates are unacceptable for Xpert Xpress SARS-CoV-2/FLU/RSV testing.  Fact Sheet for Patients: EntrepreneurPulse.com.au  Fact Sheet for Healthcare Providers: IncredibleEmployment.be  This test is not yet approved or cleared  by the [place] and has been authorized for detection and/or diagnosis of SARS-CoV-2 by FDA under an Emergency Use Authorization (EUA). This EUA will remain in effect (meaning this test can be used) for the duration of the COVID-19 declaration under Section 564(b)(1) of the Act, 21 U.S.C. section 360bbb-3(b)(1), unless the authorization is terminated or revoked.  Performed at [facility], [address] [place], [place] [address]      Radiological Exams on Admission: DG Chest Portable 1 View  Result Date: 03/04/2021 CLINICAL DATA:  Weakness, missed dialysis. EXAM: PORTABLE CHEST 1 VIEW COMPARISON:  December 15, 2020. FINDINGS: Stable cardiomediastinal silhouette. Right internal jugular dialysis catheter is unchanged in position. Sternotomy wires are noted. No pneumothorax or pleural effusion is noted. Increased bibasilar opacities are noted concerning for edema or possibly atelectasis. Bony thorax is unremarkable. IMPRESSION: Increased bibasilar opacities are noted concerning for edema or atelectasis. Electronically Signed   By: Marijo Conception M.D.   On: 03/04/2021 15:21     EKG: I have personally reviewed.  Sinus  rhythm, QTC 487, T wave peaking in V2-V3, T wave inversion in lateral leads, inferior leads and V5-V6, poor R wave progression  Assessment/Plan Principal Problem:   Hyperkalemia Active Problems:   Anemia in ESRD (end-stage renal disease) (HCC)   Chronic diastolic CHF (congestive heart failure) (HCC)   HLD (hyperlipidemia)   HTN (hypertension)   Hypothyroidism   Depression   CAD (coronary artery disease)   ESRD (end stage renal disease) on dialysis (HCC)   Type I diabetes mellitus with nephropathy (HCC)   Generalized weakness   Hyperkalemia and ESRD (end stage renal disease) on dialysis (TTS):  pt missed dialysis on Tuesday. Potassium 6.0, bicarbonate 26, creatinine 7.03, BUN 68. Dr. Holley Raring of renal is consulted for urgent dialysis.  -Placed on progressive bed for observation -Treated hyperkalemia with calcium gluconate 1 g, NovoLog 10 units, D50 and bicarbonate 50 mEq -Urgent dialysis per renal  Anemia in ESRD (end-stage renal disease) ([place]): Hgb 10.9 -f/u with CBC  Chronic diastolic CHF (congestive heart failure) ([place]): 2D echo on 11/19/2018 showed EF of 50 to 55% with grade 2 diastolic dysfunction.  Patient has trace leg edema, no worsening shortness of breath.  CHF seem to be compensated. -Volume management per renal by dialysis  HLD (hyperlipidemia) -Lipitor  HTN (hypertension): -IV hydralazine as needed -Coreg  Hypothyroidism -Synthroid  Depression -Continue Cymbalta  CAD (coronary artery disease): S/p of CABG.  No chest pain -Plavix, Lipitor  Type I diabetes mellitus with nephropathy [facility]): Patient is taking Novolin and NPH at dose of ~ 5-19 unit twice daily, but patient states that the NPH insulin dose is not fixed, it is sliding scale based. -Sliding scale insulin -NPH insulin 5 unit twice daily  Generalized weakness: No focal neurodeficit on physical examination, likely due to uremia -Pt/OT       DVT ppx: SQ Heparin    Code Status: Full code  (discussed with patient about CODE STATUS, patient wants to be full code today) Family Communication: not done, no family member is at bed side.  Disposition Plan:  Anticipate discharge back to previous environment Consults called:  Dr.Lateef of renal Admission status and Level of care: Progressive Cardiac:   for obs    Status is: Observation  The patient remains OBS appropriate and will d/c before 2 midnights.  Dispo: The patient is from: Home              Anticipated d/c is  to: Home              Patient currently is not medically stable to d/c.   Difficult to place patient No           Date of Service 03/04/2021    Ivor Costa [facility]   If 7PM-7AM, please contact night-coverage [email or web address] 03/04/2021, 5:00 PM

## 2021-03-04 NOTE — ED Triage Notes (Signed)
Pt comes into the ED via EMS from home with c/o just not feeling well, c/o generalized weakness. Pt just had a dialysis graft placed in the left arm last week., states he missed dialysis Tuesday and last treatment was Saturday,., pt has a dialysis port in the right upper chest

## 2021-03-04 NOTE — ED Triage Notes (Signed)
First Nurse Note:  Arrives from home via [facility].  C/O weakness over past several days.  Blood sugars have been labile.  CBG:  281.  VS wnl.  NAD

## 2021-03-04 NOTE — ED Notes (Signed)
Informed [place] potassium 6.0 and ST depression and tall t waves on monitor.

## 2021-03-04 NOTE — ED Notes (Signed)
Pt vomited.   

## 2021-03-05 DIAGNOSIS — N186 End stage renal disease: Secondary | ICD-10-CM | POA: Diagnosis not present

## 2021-03-05 DIAGNOSIS — I5032 Chronic diastolic (congestive) heart failure: Secondary | ICD-10-CM | POA: Diagnosis not present

## 2021-03-05 DIAGNOSIS — E875 Hyperkalemia: Secondary | ICD-10-CM | POA: Diagnosis not present

## 2021-03-05 DIAGNOSIS — D631 Anemia in chronic kidney disease: Secondary | ICD-10-CM | POA: Diagnosis not present

## 2021-03-05 DIAGNOSIS — Z992 Dependence on renal dialysis: Secondary | ICD-10-CM | POA: Diagnosis not present

## 2021-03-05 LAB — BASIC METABOLIC PANEL
Anion gap: 10 (ref 5–15)
BUN: 36 mg/dL — ABNORMAL HIGH (ref 8–23)
CO2: 29 mmol/L (ref 22–32)
Calcium: 7.9 mg/dL — ABNORMAL LOW (ref 8.9–10.3)
Chloride: 96 mmol/L — ABNORMAL LOW (ref 98–111)
Creatinine, Ser: 4.32 mg/dL — ABNORMAL HIGH (ref 0.61–1.24)
GFR, Estimated: 14 mL/min — ABNORMAL LOW (ref >60)
Glucose, Bld: 218 mg/dL — ABNORMAL HIGH (ref 70–99)
Potassium: 3.8 mmol/L (ref 3.5–5.1)
Sodium: 135 mmol/L (ref 135–145)

## 2021-03-05 LAB — TROPONIN I (HIGH SENSITIVITY)
Troponin I (High Sensitivity): 441 ng/L (ref <18)
Troponin I (High Sensitivity): 393 ng/L (ref <18)

## 2021-03-05 LAB — GLUCOSE, CAPILLARY: Glucose-Capillary: 142 mg/dL — ABNORMAL HIGH (ref 70–99)

## 2021-03-05 MED ORDER — NITROGLYCERIN 0.4 MG SL SUBL: 0.4000 mg | SUBLINGUAL_TABLET | SUBLINGUAL | Status: DC | PRN

## 2021-03-05 MED ORDER — HEPARIN SODIUM (PORCINE) 1000 UNIT/ML IJ SOLN
INTRAMUSCULAR | Status: AC
  Filled 2021-03-05: qty 1

## 2021-03-05 NOTE — Progress Notes (Addendum)
Provider notified that pt has significant swelling in left hand. Pt has left AV graft that was put in last week. Pt states that hand had been swollen ever since 2 days after his procedure. He denies numbness and tingling .Ultrasound of left arm ordered by provider. Cap refill in both hands is less that 3 seconds with full sensation. Pt has no other concerns at this time , call bell within reach.Will continue to monitor.

## 2021-03-05 NOTE — Progress Notes (Signed)
PT Cancellation Note  Patient Details Name: Bryce Garcia MRN: [id number] DOB: 1950-09-14   Cancelled Treatment:    Reason Eval/Treat Not Completed: Patient at procedure or test/unavailable. Orders received and chart reviewed. Pt off floor for HD. Will re-attempt at later time/date as able.    Salem Caster. Fairly IV, PT, DPT Physical Therapist- [facility]  03/05/2021, 8:35 AM

## 2021-03-05 NOTE — Progress Notes (Signed)
Patient tolerated 3 hours of HD treatment without incident.  UF goal of 1.5L achieved.  Report given to primary nurse

## 2021-03-05 NOTE — Progress Notes (Signed)
Discharge instructions explained/pt verbalized understanding. IV and tele removed. Will transport off unit via wheelchair.  

## 2021-03-05 NOTE — Progress Notes (Signed)
Lab called with critical troponin lab of 441. Provider Sharion Settler notified.

## 2021-03-05 NOTE — Progress Notes (Signed)
[facility]  ROUNDING NOTE   Subjective:   Bryce Garcia is a 70 y.o. male with past medical history of hypertension, hyperlipidemia, diabetes, COPD, diastolic heart failure, CAD and ESRD on dialysis. He presents to the ED with generalized weakness. He will be admitted under observation for Hyperkalemia [E87.5] Generalized weakness [R53.1] ESRD on hemodialysis ([place]) [N18.6, Z99.2] Type 2 diabetes mellitus without complication, with long-term current use of insulin (HCC) [E11.9, Z79.4] .  Patient known to our practice from previous admission. He currently receives outpatient dialysis at [address], managed by [facility] physicians. He states he missed his last treatment, Tuesday, due to not feeling well.   Patient seen resting in bed Alert and oriented Feels stronger today   Objective:  Vital signs in last 24 hours:  Temp:  [97.7 F (36.5 C)-99.1 F (37.3 C)] 98.1 F (36.7 C) (09/08 1226) Pulse Rate:  [59-78] 70 (09/08 1226) Resp:  [12-28] 19 (09/08 1226) BP: (122-162)/(49-101) 147/65 (09/08 1226) SpO2:  [97 %-100 %] 100 % (09/08 1226) Weight:  [83 kg] 83 kg (09/07 1326)  Weight change:  Filed Weights   03/04/21 1326  Weight: 83 kg    Intake/Output: I/O last 3 completed shifts: In: -  Out: 1000 [Other:1000]   Intake/Output this shift:  Total I/O In: -  Out: 1500 [Other:1500]  Physical Exam: General: NAD, resting in bed  Head: Normocephalic, atraumatic. Moist oral mucosal membranes  Eyes: Anicteric  Lungs:  Clear to auscultation, normal effort  Heart: Regular rate and rhythm  Abdomen:  Soft, nontender  Extremities:  no peripheral edema.  Neurologic: Nonfocal, moving all four extremities  Skin: No lesions  Access: Rt IJ Permcath, Lt AVF (maturing)    Basic Metabolic Panel: Recent Labs  Lab 03/04/21 1337 03/04/21 2303 03/05/21 0211  NA 135 136 135  K 6.0* 3.9 3.8  CL 95* 95* 96*  CO2 '26 31 29  '$ GLUCOSE 236* 165* 218*  BUN 68* 32* 36*   CREATININE 7.03* 4.27* 4.32*  CALCIUM 8.6* 8.4* 7.9*     Liver Function Tests: No results for input(s): AST, ALT, ALKPHOS, BILITOT, PROT, ALBUMIN in the last 168 hours. No results for input(s): LIPASE, AMYLASE in the last 168 hours. No results for input(s): AMMONIA in the last 168 hours.  CBC: Recent Labs  Lab 03/04/21 1337  WBC 8.8  HGB 10.9*  HCT 31.9*  MCV 99.1  PLT 214     Cardiac Enzymes: No results for input(s): CKTOTAL, CKMB, CKMBINDEX, TROPONINI in the last 168 hours.  BNP: Invalid input(s): POCBNP  CBG: Recent Labs  Lab 03/04/21 2255 03/05/21 1222  GLUCAP 170* 142*    Microbiology: Results for orders placed or performed during the hospital encounter of 03/04/21  Resp Panel by RT-PCR (Flu A&B, Covid) Nasopharyngeal Swab     Status: None   Collection Time: 03/04/21  2:57 PM   Specimen: Nasopharyngeal Swab; Nasopharyngeal(NP) swabs in vial transport medium  Result Value Ref Range Status   SARS Coronavirus 2 by RT PCR NEGATIVE NEGATIVE Final    Comment: (NOTE) SARS-CoV-2 target nucleic acids are NOT DETECTED.  The SARS-CoV-2 RNA is generally detectable in upper respiratory specimens during the acute phase of infection. The lowest concentration of SARS-CoV-2 viral copies this assay can detect is 138 copies/mL. A negative result does not preclude SARS-Cov-2 infection and should not be used as the sole basis for treatment or other patient management decisions. A negative result may occur with  improper specimen collection/handling, submission  of specimen other than nasopharyngeal swab, presence of viral mutation(s) within the areas targeted by this assay, and inadequate number of viral copies(<138 copies/mL). A negative result must be combined with clinical observations, patient history, and epidemiological information. The expected result is Negative.  Fact Sheet for Patients:  EntrepreneurPulse.com.au  Fact Sheet for Healthcare  Providers:  IncredibleEmployment.be  This test is no t yet approved or cleared by the [place] FDA and  has been authorized for detection and/or diagnosis of SARS-CoV-2 by FDA under an Emergency Use Authorization (EUA). This EUA will remain  in effect (meaning this test can be used) for the duration of the COVID-19 declaration under Section 564(b)(1) of the Act, 21 U.S.C.section 360bbb-3(b)(1), unless the authorization is terminated  or revoked sooner.       Influenza A by PCR NEGATIVE NEGATIVE Final   Influenza B by PCR NEGATIVE NEGATIVE Final    Comment: (NOTE) The Xpert Xpress SARS-CoV-2/FLU/RSV plus assay is intended as an aid in the diagnosis of influenza from Nasopharyngeal swab specimens and should not be used as a sole basis for treatment. Nasal washings and aspirates are unacceptable for Xpert Xpress SARS-CoV-2/FLU/RSV testing.  Fact Sheet for Patients: EntrepreneurPulse.com.au  Fact Sheet for Healthcare Providers: IncredibleEmployment.be  This test is not yet approved or cleared by the [place] FDA and has been authorized for detection and/or diagnosis of SARS-CoV-2 by FDA under an Emergency Use Authorization (EUA). This EUA will remain in effect (meaning this test can be used) for the duration of the COVID-19 declaration under Section 564(b)(1) of the Act, 21 U.S.C. section 360bbb-3(b)(1), unless the authorization is terminated or revoked.  Performed at [facility], [address] [place], [place] [address]     Coagulation Studies: No results for input(s): LABPROT, INR in the last 72 hours.  Urinalysis: No results for input(s): COLORURINE, LABSPEC, PHURINE, GLUCOSEU, HGBUR, BILIRUBINUR, KETONESUR, PROTEINUR, UROBILINOGEN, NITRITE, LEUKOCYTESUR in the last 72 hours.  Invalid input(s): APPERANCEUR    Imaging: DG Chest Portable 1 View  Result Date: 03/04/2021 CLINICAL DATA:   Weakness, missed dialysis. EXAM: PORTABLE CHEST 1 VIEW COMPARISON:  December 15, 2020. FINDINGS: Stable cardiomediastinal silhouette. Right internal jugular dialysis catheter is unchanged in position. Sternotomy wires are noted. No pneumothorax or pleural effusion is noted. Increased bibasilar opacities are noted concerning for edema or possibly atelectasis. Bony thorax is unremarkable. IMPRESSION: Increased bibasilar opacities are noted concerning for edema or atelectasis. Electronically Signed   By: Marijo Conception M.D.   On: 03/04/2021 15:21     Medications:     atorvastatin  80 mg Oral Daily   carvedilol  25 mg Oral BID   Chlorhexidine Gluconate Cloth  6 each Topical Q0600   cholecalciferol  1,000 Units Oral Daily   cinacalcet  30 mg Oral Q breakfast   clopidogrel  75 mg Oral Daily   DULoxetine  30 mg Oral Daily   heparin  5,000 Units Subcutaneous Q8H   heparin sodium (porcine)       insulin aspart  0-5 Units Subcutaneous QHS   insulin aspart  0-9 Units Subcutaneous TID WC   insulin detemir  10 Units Subcutaneous QHS   levothyroxine  100 mcg Oral Daily   multivitamin  1 tablet Oral QHS   pentoxifylline  400 mg Oral Daily   sevelamer carbonate  2,400 mg Oral TID with meals   torsemide  40 mg Oral Daily   valACYclovir  1,000 mg Oral Daily   acetaminophen, albuterol, dextromethorphan-guaiFENesin, hydrALAZINE,  nitroGLYCERIN, ondansetron (ZOFRAN) IV, traMADol  Assessment/ Plan:  Bryce Garcia is a 70 y.o.  male with past medical history of hypertension, hyperlipidemia, diabetes, COPD, diastolic heart failure, CAD and ESRD on dialysis. He presents to the ED with generalized weakness. He will be admitted under observation for Hyperkalemia [E87.5] Generalized weakness [R53.1] ESRD on hemodialysis ([place]) [N18.6, Z99.2] Type 2 diabetes mellitus without complication, with long-term current use of insulin (HCC) [E11.9, Z79.4]   [facility] [address]/TTS/Rt IJ Permcath  Hyperkalemia  secondary to missed dialysis treatments. Admission 6.0. Normal sinus rhythm on telemetry. Potassium correct with dialysis yesterday, 3.6 after treatment.   End stage renal disease on dialysis: Receiving dialysis today, UF goal 1.5L as tolerated. Cleared to discharge after dialysis and resume outpatient treatments at clinic.   3. Anemia of chronic kidney disease/ kidney injury  Lab Results  Component Value Date   HGB 10.9 (L) 03/04/2021   Hgb at goal  4. Secondary Hyperparathyroidism:  Lab Results  Component Value Date   CALCIUM 7.9 (L) 03/05/2021   PHOS 4.1 06/27/2019   Calcium low Renvela with meals outpatient Will continue to monitor bone mineral studies during admission  5. Hypertension Home regimen includes Carvedilol and Torsemide. Currently prescribed Hydralazine PRN. BP 147/65  6. Diabetes mellitus type II with chronic kidney disease/renal manifestations: insulin/noninsulin dependent. Home regimen includes Novolog and Novolin. Most recent hemoglobin A1c is 6.8 on 06/21/19 Glucose was elevated yesterday, but improved today    LOS: 0 Kirstein Baxley 9/8/202212:58 PM

## 2021-03-05 NOTE — Plan of Care (Signed)
  Problem: Education: Goal: Knowledge of General Education information will improve Description: Including pain rating scale, medication(s)/side effects and non-pharmacologic comfort measures Outcome: Progressing   Problem: Health Behavior/Discharge Planning: Goal: Ability to manage health-related needs will improve Outcome: Progressing   Problem: Clinical Measurements: Goal: Ability to maintain clinical measurements within normal limits will improve Outcome: Progressing   Problem: Clinical Measurements: Goal: Will remain free from infection Outcome: Progressing   Problem: Clinical Measurements: Goal: Diagnostic test results will improve Outcome: Progressing   Problem: Clinical Measurements: Goal: Respiratory complications will improve Outcome: Progressing   Problem: Clinical Measurements: Goal: Cardiovascular complication will be avoided Outcome: Progressing   Problem: Nutrition: Goal: Adequate nutrition will be maintained Outcome: Progressing   Problem: Coping: Goal: Level of anxiety will decrease Outcome: Progressing

## 2021-03-05 NOTE — Progress Notes (Signed)
Pt c/o chest pain 6/10 that he describes to be coming from his right rib cage. Pt describes the pain as an discomfort. EKG ordered. Provider Sharion Settler notified. See orders. (Simethicone, BMP, Troponin).He states that the simethicone has improved his discomfort in his chest.  Pt denies SOB. Pt vitals stable, call bell within reach. Pt has no other concerns at this time.    03/04/21 2250  Vitals  Temp 98.2 F (36.8 C)  BP (!) 143/61  MAP (mmHg) 82  BP Location Right Arm  BP Method Automatic  Patient Position (if appropriate) Lying  Pulse Rate 66  Pulse Rate Source Monitor  Resp 20  MEWS COLOR  MEWS Score Color Green  Oxygen Therapy  SpO2 98 %  O2 Device Room Air  MEWS Score  MEWS Temp 0  MEWS Systolic 0  MEWS Pulse 0  MEWS RR 0  MEWS LOC 0  MEWS Score 0

## 2021-03-05 NOTE — Discharge Summary (Signed)
Physician Discharge Summary  DAIN JOSH [id number] DOB: 04/08/1951 DOA: 03/04/2021  PCP: [place] date: 03/04/2021 Discharge date: 03/05/2021  Admitted From: Home Disposition: Home  Recommendations for Outpatient Follow-up:  Follow up with PCP in 1 week with repeat CBC/BMP Outpatient follow-up with hemodialysis unit as scheduled Follow up in ED if symptoms worsen or new appear   Home Health: No Equipment/Devices: None  Discharge Condition: Stable CODE STATUS: Full Diet recommendation: Heart healthy/carb modified/renal hemodialysis diet  Brief/Interim Summary: 70 year old male with history of end-stage renal disease on hemodialysis, hypertension, hyperlipidemia, diabetes mellitus, COPD, hypothyroidism, depression, left bundle branch block, chronic diastolic CHF, CAD, CABG, [id number] infection, anemia presented with generalized weakness after having missed hemodialysis.  On presentation, potassium was 6.  Chest x-ray showed increased bibasilar opacities concerning for edema or atelectasis.  Nephrology was consulted and patient underwent urgent hemodialysis on 03/04/2021.  He is undergoing hemodialysis again today on 03/05/2021 and subsequently will be discharged afterwards.  Potassium level has normalized.  He is hemodynamically stable currently.  Outpatient follow-up with PCP and hemodialysis unit as scheduled.  Discharge Diagnoses:   Hyperkalemia End-stage renal disease on hemodialysis Volume overload -Presented with potassium of 6 because of missing hemodialysis.  Status post urgent hemodialysis on 03/04/2021; undergoing hemodialysis again today on 03/05/2021. -Potassium level normal.  Discharge patient home after hemodialysis today.  Keep scheduled appointment with outpatient hemodialysis -Respiratory status currently stable on room air.  Acute on chronic diastolic CHF -Volume managed by dialysis.  Improved.  Outpatient follow-up.   Continue diet and fluid restrictions.  Anemia of chronic disease -From renal failure.  Hemoglobin stable  Hyperlipidemia -Continue Lipitor  Hypertension -Continue Coreg and torsemide  CAD status post CABG  -No chest pain.  Continue Plavix, Lipitor  Depression -Continue Cymbalta  Hypothyroidism -Continue Synthroid  Diabetes mellitus type 1 with nephropathy -Continue home regimen of insulin.  Carb modified diet     Discharge Instructions  Discharge Instructions     Diet - low sodium heart healthy   Complete by: As directed    Diet Carb Modified   Complete by: As directed    Increase activity slowly   Complete by: As directed    No wound care   Complete by: As directed       Allergies as of 03/05/2021       Reactions   Other Other (See Comments)   Hypoglycemia unawareness Hypoglycemia unawareness   Amlodipine Swelling        Medication List     STOP taking these medications    aspirin 81 MG EC tablet   insulin NPH Human 100 UNIT/ML injection Commonly known as: NOVOLIN N       TAKE these medications    albuterol 108 (90 Base) MCG/ACT inhaler Commonly known as: VENTOLIN HFA Inhale 2 puffs into the lungs every 4 (four) hours as needed for wheezing.   atorvastatin 80 MG tablet Commonly known as: LIPITOR Take 80 mg by mouth daily.   b complex-vitamin c-folic acid 0.8 MG Tabs tablet Take 1 tablet by mouth daily.   carvedilol 25 MG tablet Commonly known as: COREG Take 25 mg by mouth 2 (two) times daily.   cinacalcet 30 MG tablet Commonly known as: SENSIPAR Take 30 mg by mouth daily.   clopidogrel 75 MG tablet Commonly known as: PLAVIX Take 75 mg by mouth daily.   DULoxetine 30 MG capsule Commonly known as: CYMBALTA Take 30 mg by mouth  daily.   Euthyrox 100 MCG tablet Generic drug: levothyroxine Take 100 mcg by mouth daily. What changed: Another medication with the same name was removed. Continue taking this medication, and follow  the directions you see here.   fluocinonide cream 0.05 % Commonly known as: LIDEX Apply 1 application topically 2 (two) times daily. For rash on legs.   insulin regular 100 units/mL injection Commonly known as: NOVOLIN R Inject 5-20 Units into the skin 3 (three) times daily before meals. Depending on blood glucose level   ipratropium 17 MCG/ACT inhaler Commonly known as: ATROVENT HFA Inhale 2 puffs into the lungs every 6 (six) hours as needed for wheezing.   pentoxifylline 400 MG CR tablet Commonly known as: TRENTAL Take 400 mg by mouth 3 (three) times daily.   sevelamer carbonate 800 MG tablet Commonly known as: RENVELA Take 2,400 mg by mouth 3 (three) times daily.   torsemide 20 MG tablet Commonly known as: DEMADEX Take 2 tablets (40 mg total) by mouth daily.   traMADol 50 MG tablet Commonly known as: ULTRAM Take 50 mg by mouth every 6 (six) hours as needed.   valACYclovir 1000 MG tablet Commonly known as: VALTREX Take by mouth.   VITAMIN D (CHOLECALCIFEROL) PO Take by mouth.        Follow-up Information     [place], [place]. Schedule an appointment as soon as possible for a visit in 1 week(s).   Contact information: [place] [address] [phone]                Allergies  Allergen Reactions   Other Other (See Comments)    Hypoglycemia unawareness Hypoglycemia unawareness    Amlodipine Swelling    Consultations: Nephrology   Procedures/Studies: DG Chest Portable 1 View  Result Date: 03/04/2021 CLINICAL DATA:  Weakness, missed dialysis. EXAM: PORTABLE CHEST 1 VIEW COMPARISON:  December 15, 2020. FINDINGS: Stable cardiomediastinal silhouette. Right internal jugular dialysis catheter is unchanged in position. Sternotomy wires are noted. No pneumothorax or pleural effusion is noted. Increased bibasilar opacities are noted concerning for edema or possibly atelectasis. Bony thorax is unremarkable.  IMPRESSION: Increased bibasilar opacities are noted concerning for edema or atelectasis. Electronically Signed   By: Marijo Conception M.D.   On: 03/04/2021 15:21      Subjective: Patient seen and examined at bedside undergoing hemodialysis.  Feels much better and feels that he is ready to go home today after dialysis.  Denies worsening shortness of breath, chest pain, fever or vomiting.  Discharge Exam: Vitals:   03/05/21 1045 03/05/21 1100  BP: (!) 138/58   Pulse: 62 63  Resp: 14 15  Temp:    SpO2: 100% 99%    General: Pt is alert, awake, not in acute distress.  Currently on room air.  Looks chronically ill and deconditioned. Cardiovascular: rate controlled, S1/S2 + Respiratory: bilateral decreased breath sounds at bases with basilar crackles Abdominal: Soft, NT, ND, bowel sounds + Extremities: Bilateral lower extremity edema present; left upper extremity edema also present.  No cyanosis    The results of significant diagnostics from this hospitalization (including imaging, microbiology, ancillary and laboratory) are listed below for reference.     Microbiology: Recent Results (from the past 240 hour(s))  Resp Panel by RT-PCR (Flu A&B, Covid) Nasopharyngeal Swab     Status: None   Collection Time: 03/04/21  2:57 PM   Specimen: Nasopharyngeal Swab; Nasopharyngeal(NP) swabs in vial transport medium  Result Value Ref Range Status   SARS Coronavirus 2 by RT PCR NEGATIVE NEGATIVE Final    Comment: (NOTE) SARS-CoV-2 target nucleic acids are NOT DETECTED.  The SARS-CoV-2 RNA is generally detectable in upper respiratory specimens during the acute phase of infection. The lowest concentration of SARS-CoV-2 viral copies this assay can detect is 138 copies/mL. A negative result does not preclude SARS-Cov-2 infection and should not be used as the sole basis for treatment or other patient management decisions. A negative result may occur with  improper specimen collection/handling,  submission of specimen other than nasopharyngeal swab, presence of viral mutation(s) within the areas targeted by this assay, and inadequate number of viral copies(<138 copies/mL). A negative result must be combined with clinical observations, patient history, and epidemiological information. The expected result is Negative.  Fact Sheet for Patients:  EntrepreneurPulse.com.au  Fact Sheet for Healthcare Providers:  IncredibleEmployment.be  This test is no t yet approved or cleared by the [place] FDA and  has been authorized for detection and/or diagnosis of SARS-CoV-2 by FDA under an Emergency Use Authorization (EUA). This EUA will remain  in effect (meaning this test can be used) for the duration of the COVID-19 declaration under Section 564(b)(1) of the Act, 21 U.S.C.section 360bbb-3(b)(1), unless the authorization is terminated  or revoked sooner.       Influenza A by PCR NEGATIVE NEGATIVE Final   Influenza B by PCR NEGATIVE NEGATIVE Final    Comment: (NOTE) The Xpert Xpress SARS-CoV-2/FLU/RSV plus assay is intended as an aid in the diagnosis of influenza from Nasopharyngeal swab specimens and should not be used as a sole basis for treatment. Nasal washings and aspirates are unacceptable for Xpert Xpress SARS-CoV-2/FLU/RSV testing.  Fact Sheet for Patients: EntrepreneurPulse.com.au  Fact Sheet for Healthcare Providers: IncredibleEmployment.be  This test is not yet approved or cleared by the [place] FDA and has been authorized for detection and/or diagnosis of SARS-CoV-2 by FDA under an Emergency Use Authorization (EUA). This EUA will remain in effect (meaning this test can be used) for the duration of the COVID-19 declaration under Section 564(b)(1) of the Act, 21 U.S.C. section 360bbb-3(b)(1), unless the authorization is terminated or revoked.  Performed at [facility], [address] [place], [place] [address]      Labs: BNP (last 3 results) No results for input(s): BNP in the last 8760 hours. Basic Metabolic Panel: Recent Labs  Lab 03/04/21 1337 03/04/21 2303 03/05/21 0211  NA 135 136 135  K 6.0* 3.9 3.8  CL 95* 95* 96*  CO2 '26 31 29  '$ GLUCOSE 236* 165* 218*  BUN 68* 32* 36*  CREATININE 7.03* 4.27* 4.32*  CALCIUM 8.6* 8.4* 7.9*   Liver Function Tests: No results for input(s): AST, ALT, ALKPHOS, BILITOT, PROT, ALBUMIN in the last 168 hours. No results for input(s): LIPASE, AMYLASE in the last 168 hours. No results for input(s): AMMONIA in the last 168 hours. CBC: Recent Labs  Lab 03/04/21 1337  WBC 8.8  HGB 10.9*  HCT 31.9*  MCV 99.1  PLT 214   Cardiac Enzymes: No results for input(s): CKTOTAL, CKMB, CKMBINDEX, TROPONINI in the last 168 hours. BNP: Invalid input(s): POCBNP CBG: Recent Labs  Lab 03/04/21 2255  GLUCAP 170*   D-Dimer No results for input(s): DDIMER in the last 72 hours. Hgb A1c No results for input(s): HGBA1C in the last 72 hours. Lipid Profile No results for input(s): CHOL, HDL, LDLCALC, TRIG, CHOLHDL, LDLDIRECT in the last 72 hours. Thyroid function  studies No results for input(s): TSH, T4TOTAL, T3FREE, THYROIDAB in the last 72 hours.  Invalid input(s): FREET3 Anemia work up No results for input(s): VITAMINB12, FOLATE, FERRITIN, TIBC, IRON, RETICCTPCT in the last 72 hours. Urinalysis    Component Value Date/Time   COLORURINE STRAW (A) 12/15/2020 1820   APPEARANCEUR CLEAR (A) 12/15/2020 1820   APPEARANCEUR Clear 01/07/2014 1900   LABSPEC 1.008 12/15/2020 1820   LABSPEC 1.015 01/07/2014 1900   PHURINE 9.0 (H) 12/15/2020 1820   GLUCOSEU >=500 (A) 12/15/2020 1820   GLUCOSEU >=500 01/07/2014 1900   HGBUR NEGATIVE 12/15/2020 1820   BILIRUBINUR NEGATIVE 12/15/2020 1820   BILIRUBINUR Negative 01/07/2014 1900   KETONESUR 5 (A) 12/15/2020 1820   PROTEINUR >=300 (A) 12/15/2020 1820   NITRITE NEGATIVE  12/15/2020 1820   LEUKOCYTESUR NEGATIVE 12/15/2020 1820   LEUKOCYTESUR Negative 01/07/2014 1900   Sepsis Labs Invalid input(s): PROCALCITONIN,  WBC,  LACTICIDVEN Microbiology Recent Results (from the past 240 hour(s))  Resp Panel by RT-PCR (Flu A&B, Covid) Nasopharyngeal Swab     Status: None   Collection Time: 03/04/21  2:57 PM   Specimen: Nasopharyngeal Swab; Nasopharyngeal(NP) swabs in vial transport medium  Result Value Ref Range Status   SARS Coronavirus 2 by RT PCR NEGATIVE NEGATIVE Final    Comment: (NOTE) SARS-CoV-2 target nucleic acids are NOT DETECTED.  The SARS-CoV-2 RNA is generally detectable in upper respiratory specimens during the acute phase of infection. The lowest concentration of SARS-CoV-2 viral copies this assay can detect is 138 copies/mL. A negative result does not preclude SARS-Cov-2 infection and should not be used as the sole basis for treatment or other patient management decisions. A negative result may occur with  improper specimen collection/handling, submission of specimen other than nasopharyngeal swab, presence of viral mutation(s) within the areas targeted by this assay, and inadequate number of viral copies(<138 copies/mL). A negative result must be combined with clinical observations, patient history, and epidemiological information. The expected result is Negative.  Fact Sheet for Patients:  EntrepreneurPulse.com.au  Fact Sheet for Healthcare Providers:  IncredibleEmployment.be  This test is no t yet approved or cleared by the [place] FDA and  has been authorized for detection and/or diagnosis of SARS-CoV-2 by FDA under an Emergency Use Authorization (EUA). This EUA will remain  in effect (meaning this test can be used) for the duration of the COVID-19 declaration under Section 564(b)(1) of the Act, 21 U.S.C.section 360bbb-3(b)(1), unless the authorization is terminated  or revoked sooner.        Influenza A by PCR NEGATIVE NEGATIVE Final   Influenza B by PCR NEGATIVE NEGATIVE Final    Comment: (NOTE) The Xpert Xpress SARS-CoV-2/FLU/RSV plus assay is intended as an aid in the diagnosis of influenza from Nasopharyngeal swab specimens and should not be used as a sole basis for treatment. Nasal washings and aspirates are unacceptable for Xpert Xpress SARS-CoV-2/FLU/RSV testing.  Fact Sheet for Patients: EntrepreneurPulse.com.au  Fact Sheet for Healthcare Providers: IncredibleEmployment.be  This test is not yet approved or cleared by the [place] FDA and has been authorized for detection and/or diagnosis of SARS-CoV-2 by FDA under an Emergency Use Authorization (EUA). This EUA will remain in effect (meaning this test can be used) for the duration of the COVID-19 declaration under Section 564(b)(1) of the Act, 21 U.S.C. section 360bbb-3(b)(1), unless the authorization is terminated or revoked.  Performed at [facility], [address] [place], [place] [address]      Time coordinating discharge: 35 minutes  SIGNED:   Aline August, MD  [facility] 03/05/2021, 11:06 AM

## 2021-03-05 NOTE — Progress Notes (Signed)
OT Cancellation Note  Patient Details Name: Bryce Garcia MRN: [id number] DOB: 1950/11/24   Cancelled Treatment:    Reason Eval/Treat Not Completed: Patient at procedure or test/ unavailable. Pt currently off floor for HD. Will attempt OT evaluation at a later time/date, as pt is available and medically appropriate.  Josiah Lobo, PhD, MS, OTR/L 03/05/21, 8:39 AM

## 2021-03-06 DIAGNOSIS — E1122 Type 2 diabetes mellitus with diabetic chronic kidney disease: Secondary | ICD-10-CM | POA: Diagnosis not present

## 2021-03-06 DIAGNOSIS — N184 Chronic kidney disease, stage 4 (severe): Secondary | ICD-10-CM | POA: Diagnosis not present

## 2021-03-06 DIAGNOSIS — Z09 Encounter for follow-up examination after completed treatment for conditions other than malignant neoplasm: Secondary | ICD-10-CM | POA: Diagnosis not present

## 2021-03-07 DIAGNOSIS — N2581 Secondary hyperparathyroidism of renal origin: Secondary | ICD-10-CM | POA: Diagnosis not present

## 2021-03-07 DIAGNOSIS — N186 End stage renal disease: Secondary | ICD-10-CM | POA: Diagnosis not present

## 2021-03-07 DIAGNOSIS — Z992 Dependence on renal dialysis: Secondary | ICD-10-CM | POA: Diagnosis not present

## 2021-03-10 DIAGNOSIS — N2581 Secondary hyperparathyroidism of renal origin: Secondary | ICD-10-CM | POA: Diagnosis not present

## 2021-03-10 DIAGNOSIS — N186 End stage renal disease: Secondary | ICD-10-CM | POA: Diagnosis not present

## 2021-03-10 DIAGNOSIS — Z992 Dependence on renal dialysis: Secondary | ICD-10-CM | POA: Diagnosis not present

## 2021-03-11 DIAGNOSIS — E103593 Type 1 diabetes mellitus with proliferative diabetic retinopathy without macular edema, bilateral: Secondary | ICD-10-CM | POA: Diagnosis not present

## 2021-03-11 DIAGNOSIS — H43393 Other vitreous opacities, bilateral: Secondary | ICD-10-CM | POA: Diagnosis not present

## 2021-03-11 DIAGNOSIS — E108 Type 1 diabetes mellitus with unspecified complications: Secondary | ICD-10-CM | POA: Diagnosis not present

## 2021-03-11 DIAGNOSIS — Z961 Presence of intraocular lens: Secondary | ICD-10-CM | POA: Diagnosis not present

## 2021-03-11 DIAGNOSIS — H269 Unspecified cataract: Secondary | ICD-10-CM | POA: Diagnosis not present

## 2021-03-12 DIAGNOSIS — Z992 Dependence on renal dialysis: Secondary | ICD-10-CM | POA: Diagnosis not present

## 2021-03-12 DIAGNOSIS — N186 End stage renal disease: Secondary | ICD-10-CM | POA: Diagnosis not present

## 2021-03-12 DIAGNOSIS — N2581 Secondary hyperparathyroidism of renal origin: Secondary | ICD-10-CM | POA: Diagnosis not present

## 2021-03-13 DIAGNOSIS — E113553 Type 2 diabetes mellitus with stable proliferative diabetic retinopathy, bilateral: Secondary | ICD-10-CM | POA: Diagnosis not present

## 2021-03-13 DIAGNOSIS — N185 Chronic kidney disease, stage 5: Secondary | ICD-10-CM | POA: Diagnosis not present

## 2021-03-13 DIAGNOSIS — Z961 Presence of intraocular lens: Secondary | ICD-10-CM | POA: Diagnosis not present

## 2021-03-13 DIAGNOSIS — H25811 Combined forms of age-related cataract, right eye: Secondary | ICD-10-CM | POA: Diagnosis not present

## 2021-03-13 DIAGNOSIS — H43393 Other vitreous opacities, bilateral: Secondary | ICD-10-CM | POA: Diagnosis not present

## 2021-03-13 DIAGNOSIS — I5020 Unspecified systolic (congestive) heart failure: Secondary | ICD-10-CM | POA: Diagnosis not present

## 2021-03-13 DIAGNOSIS — I2510 Atherosclerotic heart disease of native coronary artery without angina pectoris: Secondary | ICD-10-CM | POA: Diagnosis not present

## 2021-03-13 DIAGNOSIS — I739 Peripheral vascular disease, unspecified: Secondary | ICD-10-CM | POA: Diagnosis not present

## 2021-03-14 DIAGNOSIS — N186 End stage renal disease: Secondary | ICD-10-CM | POA: Diagnosis not present

## 2021-03-14 DIAGNOSIS — Z992 Dependence on renal dialysis: Secondary | ICD-10-CM | POA: Diagnosis not present

## 2021-03-14 DIAGNOSIS — N2581 Secondary hyperparathyroidism of renal origin: Secondary | ICD-10-CM | POA: Diagnosis not present

## 2021-03-17 DIAGNOSIS — Z992 Dependence on renal dialysis: Secondary | ICD-10-CM | POA: Diagnosis not present

## 2021-03-17 DIAGNOSIS — N2581 Secondary hyperparathyroidism of renal origin: Secondary | ICD-10-CM | POA: Diagnosis not present

## 2021-03-17 DIAGNOSIS — N186 End stage renal disease: Secondary | ICD-10-CM | POA: Diagnosis not present

## 2021-03-19 DIAGNOSIS — N186 End stage renal disease: Secondary | ICD-10-CM | POA: Diagnosis not present

## 2021-03-19 DIAGNOSIS — Z992 Dependence on renal dialysis: Secondary | ICD-10-CM | POA: Diagnosis not present

## 2021-03-19 DIAGNOSIS — N2581 Secondary hyperparathyroidism of renal origin: Secondary | ICD-10-CM | POA: Diagnosis not present

## 2021-03-21 DIAGNOSIS — N2581 Secondary hyperparathyroidism of renal origin: Secondary | ICD-10-CM | POA: Diagnosis not present

## 2021-03-21 DIAGNOSIS — N186 End stage renal disease: Secondary | ICD-10-CM | POA: Diagnosis not present

## 2021-03-21 DIAGNOSIS — Z992 Dependence on renal dialysis: Secondary | ICD-10-CM | POA: Diagnosis not present

## 2021-03-24 DIAGNOSIS — N186 End stage renal disease: Secondary | ICD-10-CM | POA: Diagnosis not present

## 2021-03-24 DIAGNOSIS — Z992 Dependence on renal dialysis: Secondary | ICD-10-CM | POA: Diagnosis not present

## 2021-03-24 DIAGNOSIS — N2581 Secondary hyperparathyroidism of renal origin: Secondary | ICD-10-CM | POA: Diagnosis not present

## 2021-03-26 DIAGNOSIS — N186 End stage renal disease: Secondary | ICD-10-CM | POA: Diagnosis not present

## 2021-03-26 DIAGNOSIS — Z992 Dependence on renal dialysis: Secondary | ICD-10-CM | POA: Diagnosis not present

## 2021-03-26 DIAGNOSIS — N2581 Secondary hyperparathyroidism of renal origin: Secondary | ICD-10-CM | POA: Diagnosis not present

## 2021-03-27 DIAGNOSIS — N186 End stage renal disease: Secondary | ICD-10-CM | POA: Diagnosis not present

## 2021-03-27 DIAGNOSIS — N261 Atrophy of kidney (terminal): Secondary | ICD-10-CM | POA: Diagnosis not present

## 2021-03-27 DIAGNOSIS — Z992 Dependence on renal dialysis: Secondary | ICD-10-CM | POA: Diagnosis not present

## 2021-03-27 DIAGNOSIS — I739 Peripheral vascular disease, unspecified: Secondary | ICD-10-CM | POA: Diagnosis not present

## 2021-03-27 DIAGNOSIS — E1122 Type 2 diabetes mellitus with diabetic chronic kidney disease: Secondary | ICD-10-CM | POA: Diagnosis not present

## 2021-03-28 DIAGNOSIS — N2581 Secondary hyperparathyroidism of renal origin: Secondary | ICD-10-CM | POA: Diagnosis not present

## 2021-03-28 DIAGNOSIS — Z992 Dependence on renal dialysis: Secondary | ICD-10-CM | POA: Diagnosis not present

## 2021-03-28 DIAGNOSIS — N186 End stage renal disease: Secondary | ICD-10-CM | POA: Diagnosis not present

## 2021-03-30 DIAGNOSIS — E785 Hyperlipidemia, unspecified: Secondary | ICD-10-CM | POA: Diagnosis not present

## 2021-03-30 DIAGNOSIS — I739 Peripheral vascular disease, unspecified: Secondary | ICD-10-CM | POA: Diagnosis not present

## 2021-03-30 DIAGNOSIS — N184 Chronic kidney disease, stage 4 (severe): Secondary | ICD-10-CM | POA: Diagnosis not present

## 2021-03-30 DIAGNOSIS — Z794 Long term (current) use of insulin: Secondary | ICD-10-CM | POA: Diagnosis not present

## 2021-03-30 DIAGNOSIS — I129 Hypertensive chronic kidney disease with stage 1 through stage 4 chronic kidney disease, or unspecified chronic kidney disease: Secondary | ICD-10-CM | POA: Diagnosis not present

## 2021-03-30 DIAGNOSIS — I6523 Occlusion and stenosis of bilateral carotid arteries: Secondary | ICD-10-CM | POA: Diagnosis not present

## 2021-03-30 DIAGNOSIS — E1122 Type 2 diabetes mellitus with diabetic chronic kidney disease: Secondary | ICD-10-CM | POA: Diagnosis not present

## 2021-03-31 DIAGNOSIS — Z992 Dependence on renal dialysis: Secondary | ICD-10-CM | POA: Diagnosis not present

## 2021-03-31 DIAGNOSIS — N2581 Secondary hyperparathyroidism of renal origin: Secondary | ICD-10-CM | POA: Diagnosis not present

## 2021-03-31 DIAGNOSIS — N186 End stage renal disease: Secondary | ICD-10-CM | POA: Diagnosis not present

## 2021-04-01 DIAGNOSIS — N185 Chronic kidney disease, stage 5: Secondary | ICD-10-CM | POA: Diagnosis not present

## 2021-04-01 DIAGNOSIS — I5022 Chronic systolic (congestive) heart failure: Secondary | ICD-10-CM | POA: Diagnosis not present

## 2021-04-01 DIAGNOSIS — I739 Peripheral vascular disease, unspecified: Secondary | ICD-10-CM | POA: Diagnosis not present

## 2021-04-01 DIAGNOSIS — I25118 Atherosclerotic heart disease of native coronary artery with other forms of angina pectoris: Secondary | ICD-10-CM | POA: Diagnosis not present

## 2021-04-01 DIAGNOSIS — E1151 Type 2 diabetes mellitus with diabetic peripheral angiopathy without gangrene: Secondary | ICD-10-CM | POA: Diagnosis not present

## 2021-04-01 DIAGNOSIS — I2510 Atherosclerotic heart disease of native coronary artery without angina pectoris: Secondary | ICD-10-CM | POA: Diagnosis not present

## 2021-04-01 DIAGNOSIS — E7800 Pure hypercholesterolemia, unspecified: Secondary | ICD-10-CM | POA: Diagnosis not present

## 2021-04-01 DIAGNOSIS — I132 Hypertensive heart and chronic kidney disease with heart failure and with stage 5 chronic kidney disease, or end stage renal disease: Secondary | ICD-10-CM | POA: Diagnosis not present

## 2021-04-01 DIAGNOSIS — E1122 Type 2 diabetes mellitus with diabetic chronic kidney disease: Secondary | ICD-10-CM | POA: Diagnosis not present

## 2021-04-01 DIAGNOSIS — Z951 Presence of aortocoronary bypass graft: Secondary | ICD-10-CM | POA: Diagnosis not present

## 2021-04-01 DIAGNOSIS — I5020 Unspecified systolic (congestive) heart failure: Secondary | ICD-10-CM | POA: Diagnosis not present

## 2021-04-01 DIAGNOSIS — E1140 Type 2 diabetes mellitus with diabetic neuropathy, unspecified: Secondary | ICD-10-CM | POA: Diagnosis not present

## 2021-04-01 DIAGNOSIS — J449 Chronic obstructive pulmonary disease, unspecified: Secondary | ICD-10-CM | POA: Diagnosis not present

## 2021-04-02 DIAGNOSIS — N2581 Secondary hyperparathyroidism of renal origin: Secondary | ICD-10-CM | POA: Diagnosis not present

## 2021-04-02 DIAGNOSIS — Z992 Dependence on renal dialysis: Secondary | ICD-10-CM | POA: Diagnosis not present

## 2021-04-02 DIAGNOSIS — N186 End stage renal disease: Secondary | ICD-10-CM | POA: Diagnosis not present

## 2021-04-03 DIAGNOSIS — Z452 Encounter for adjustment and management of vascular access device: Secondary | ICD-10-CM | POA: Diagnosis not present

## 2021-04-04 DIAGNOSIS — N2581 Secondary hyperparathyroidism of renal origin: Secondary | ICD-10-CM | POA: Diagnosis not present

## 2021-04-04 DIAGNOSIS — N186 End stage renal disease: Secondary | ICD-10-CM | POA: Diagnosis not present

## 2021-04-04 DIAGNOSIS — Z992 Dependence on renal dialysis: Secondary | ICD-10-CM | POA: Diagnosis not present

## 2021-04-07 DIAGNOSIS — Z992 Dependence on renal dialysis: Secondary | ICD-10-CM | POA: Diagnosis not present

## 2021-04-07 DIAGNOSIS — N2581 Secondary hyperparathyroidism of renal origin: Secondary | ICD-10-CM | POA: Diagnosis not present

## 2021-04-07 DIAGNOSIS — N186 End stage renal disease: Secondary | ICD-10-CM | POA: Diagnosis not present

## 2021-04-09 DIAGNOSIS — N2581 Secondary hyperparathyroidism of renal origin: Secondary | ICD-10-CM | POA: Diagnosis not present

## 2021-04-09 DIAGNOSIS — N186 End stage renal disease: Secondary | ICD-10-CM | POA: Diagnosis not present

## 2021-04-09 DIAGNOSIS — Z992 Dependence on renal dialysis: Secondary | ICD-10-CM | POA: Diagnosis not present

## 2021-04-10 DIAGNOSIS — J449 Chronic obstructive pulmonary disease, unspecified: Secondary | ICD-10-CM | POA: Diagnosis not present

## 2021-04-10 DIAGNOSIS — I10 Essential (primary) hypertension: Secondary | ICD-10-CM | POA: Diagnosis not present

## 2021-04-11 DIAGNOSIS — N186 End stage renal disease: Secondary | ICD-10-CM | POA: Diagnosis not present

## 2021-04-11 DIAGNOSIS — Z992 Dependence on renal dialysis: Secondary | ICD-10-CM | POA: Diagnosis not present

## 2021-04-11 DIAGNOSIS — N2581 Secondary hyperparathyroidism of renal origin: Secondary | ICD-10-CM | POA: Diagnosis not present

## 2021-04-14 ENCOUNTER — Inpatient Hospital Stay
Admission: EM | Admit: 2021-04-14 | Discharge: 2021-04-16 | DRG: 291 | Disposition: A | Discharge status: Home / Self Care | Payer: Medicare HMO | Attending: Internal Medicine | Admitting: Internal Medicine

## 2021-04-14 ENCOUNTER — Inpatient Hospital Stay (HOSPITAL_COMMUNITY)
Admit: 2021-04-14 | Discharge: 2021-04-14 | Disposition: A | Discharge status: Home / Self Care | Payer: Medicare HMO | Attending: Physician Assistant | Admitting: Physician Assistant

## 2021-04-14 ENCOUNTER — Encounter: Payer: Self-pay | Admitting: Emergency Medicine

## 2021-04-14 ENCOUNTER — Other Ambulatory Visit: Payer: Self-pay

## 2021-04-14 ENCOUNTER — Emergency Department: Payer: Medicare HMO

## 2021-04-14 DIAGNOSIS — I5033 Acute on chronic diastolic (congestive) heart failure: Secondary | ICD-10-CM | POA: Diagnosis present

## 2021-04-14 DIAGNOSIS — E875 Hyperkalemia: Secondary | ICD-10-CM | POA: Diagnosis present

## 2021-04-14 DIAGNOSIS — Z888 Allergy status to other drugs, medicaments and biological substances status: Secondary | ICD-10-CM | POA: Diagnosis not present

## 2021-04-14 DIAGNOSIS — E669 Obesity, unspecified: Secondary | ICD-10-CM | POA: Diagnosis present

## 2021-04-14 DIAGNOSIS — I5021 Acute systolic (congestive) heart failure: Secondary | ICD-10-CM | POA: Diagnosis not present

## 2021-04-14 DIAGNOSIS — Z992 Dependence on renal dialysis: Secondary | ICD-10-CM | POA: Diagnosis not present

## 2021-04-14 DIAGNOSIS — I214 Non-ST elevation (NSTEMI) myocardial infarction: Secondary | ICD-10-CM

## 2021-04-14 DIAGNOSIS — Z8616 Personal history of COVID-19: Secondary | ICD-10-CM | POA: Diagnosis not present

## 2021-04-14 DIAGNOSIS — Z79899 Other long term (current) drug therapy: Secondary | ICD-10-CM

## 2021-04-14 DIAGNOSIS — I132 Hypertensive heart and chronic kidney disease with heart failure and with stage 5 chronic kidney disease, or end stage renal disease: Secondary | ICD-10-CM | POA: Diagnosis not present

## 2021-04-14 DIAGNOSIS — I5023 Acute on chronic systolic (congestive) heart failure: Secondary | ICD-10-CM

## 2021-04-14 DIAGNOSIS — I10 Essential (primary) hypertension: Secondary | ICD-10-CM | POA: Diagnosis present

## 2021-04-14 DIAGNOSIS — E1122 Type 2 diabetes mellitus with diabetic chronic kidney disease: Secondary | ICD-10-CM | POA: Diagnosis not present

## 2021-04-14 DIAGNOSIS — J810 Acute pulmonary edema: Secondary | ICD-10-CM

## 2021-04-14 DIAGNOSIS — I248 Other forms of acute ischemic heart disease: Secondary | ICD-10-CM | POA: Diagnosis present

## 2021-04-14 DIAGNOSIS — Z20822 Contact with and (suspected) exposure to covid-19: Secondary | ICD-10-CM | POA: Diagnosis not present

## 2021-04-14 DIAGNOSIS — Z833 Family history of diabetes mellitus: Secondary | ICD-10-CM

## 2021-04-14 DIAGNOSIS — J9601 Acute respiratory failure with hypoxia: Secondary | ICD-10-CM | POA: Diagnosis not present

## 2021-04-14 DIAGNOSIS — Z87891 Personal history of nicotine dependence: Secondary | ICD-10-CM

## 2021-04-14 DIAGNOSIS — I959 Hypotension, unspecified: Secondary | ICD-10-CM | POA: Diagnosis present

## 2021-04-14 DIAGNOSIS — Z7902 Long term (current) use of antithrombotics/antiplatelets: Secondary | ICD-10-CM | POA: Diagnosis not present

## 2021-04-14 DIAGNOSIS — Z794 Long term (current) use of insulin: Secondary | ICD-10-CM

## 2021-04-14 DIAGNOSIS — J189 Pneumonia, unspecified organism: Secondary | ICD-10-CM

## 2021-04-14 DIAGNOSIS — N186 End stage renal disease: Secondary | ICD-10-CM

## 2021-04-14 DIAGNOSIS — N184 Chronic kidney disease, stage 4 (severe): Secondary | ICD-10-CM

## 2021-04-14 DIAGNOSIS — E1165 Type 2 diabetes mellitus with hyperglycemia: Secondary | ICD-10-CM | POA: Diagnosis present

## 2021-04-14 DIAGNOSIS — R000 Tachycardia, unspecified: Secondary | ICD-10-CM | POA: Diagnosis not present

## 2021-04-14 DIAGNOSIS — Z8249 Family history of ischemic heart disease and other diseases of the circulatory system: Secondary | ICD-10-CM

## 2021-04-14 DIAGNOSIS — Z6830 Body mass index (BMI) 30.0-30.9, adult: Secondary | ICD-10-CM

## 2021-04-14 DIAGNOSIS — E1129 Type 2 diabetes mellitus with other diabetic kidney complication: Secondary | ICD-10-CM | POA: Diagnosis present

## 2021-04-14 DIAGNOSIS — Z743 Need for continuous supervision: Secondary | ICD-10-CM | POA: Diagnosis not present

## 2021-04-14 DIAGNOSIS — E8770 Fluid overload, unspecified: Secondary | ICD-10-CM

## 2021-04-14 DIAGNOSIS — D631 Anemia in chronic kidney disease: Secondary | ICD-10-CM | POA: Diagnosis not present

## 2021-04-14 DIAGNOSIS — I2510 Atherosclerotic heart disease of native coronary artery without angina pectoris: Secondary | ICD-10-CM | POA: Diagnosis present

## 2021-04-14 DIAGNOSIS — R0902 Hypoxemia: Secondary | ICD-10-CM | POA: Diagnosis not present

## 2021-04-14 DIAGNOSIS — R739 Hyperglycemia, unspecified: Secondary | ICD-10-CM | POA: Diagnosis not present

## 2021-04-14 DIAGNOSIS — J441 Chronic obstructive pulmonary disease with (acute) exacerbation: Secondary | ICD-10-CM | POA: Diagnosis not present

## 2021-04-14 DIAGNOSIS — E785 Hyperlipidemia, unspecified: Secondary | ICD-10-CM | POA: Diagnosis present

## 2021-04-14 DIAGNOSIS — Z66 Do not resuscitate: Secondary | ICD-10-CM | POA: Diagnosis present

## 2021-04-14 DIAGNOSIS — Z83438 Family history of other disorder of lipoprotein metabolism and other lipidemia: Secondary | ICD-10-CM

## 2021-04-14 DIAGNOSIS — N2581 Secondary hyperparathyroidism of renal origin: Secondary | ICD-10-CM | POA: Diagnosis not present

## 2021-04-14 DIAGNOSIS — R918 Other nonspecific abnormal finding of lung field: Secondary | ICD-10-CM | POA: Diagnosis not present

## 2021-04-14 DIAGNOSIS — J449 Chronic obstructive pulmonary disease, unspecified: Secondary | ICD-10-CM | POA: Diagnosis not present

## 2021-04-14 DIAGNOSIS — J811 Chronic pulmonary edema: Secondary | ICD-10-CM | POA: Diagnosis present

## 2021-04-14 DIAGNOSIS — R0689 Other abnormalities of breathing: Secondary | ICD-10-CM | POA: Diagnosis not present

## 2021-04-14 LAB — CBC WITH DIFFERENTIAL/PLATELET
Abs Immature Granulocytes: 0.05 10*3/uL (ref 0.00–0.07)
Basophils Absolute: 0.1 10*3/uL (ref 0.0–0.1)
Basophils Relative: 1 %
Eosinophils Absolute: 0.1 10*3/uL (ref 0.0–0.5)
Eosinophils Relative: 1 %
HCT: 35.6 % — ABNORMAL LOW (ref 39.0–52.0)
Hemoglobin: 11.6 g/dL — ABNORMAL LOW (ref 13.0–17.0)
Immature Granulocytes: 1 %
Lymphocytes Relative: 12 %
Lymphs Abs: 1.3 10*3/uL (ref 0.7–4.0)
MCH: 34.7 pg — ABNORMAL HIGH (ref 26.0–34.0)
MCHC: 32.6 g/dL (ref 30.0–36.0)
MCV: 106.6 fL — ABNORMAL HIGH (ref 80.0–100.0)
Monocytes Absolute: 0.6 10*3/uL (ref 0.1–1.0)
Monocytes Relative: 5 %
Neutro Abs: 8.6 10*3/uL — ABNORMAL HIGH (ref 1.7–7.7)
Neutrophils Relative %: 80 %
Platelets: 222 10*3/uL (ref 150–400)
RBC: 3.34 MIL/uL — ABNORMAL LOW (ref 4.22–5.81)
RDW: 14.6 % (ref 11.5–15.5)
WBC: 10.7 10*3/uL — ABNORMAL HIGH (ref 4.0–10.5)
nRBC: 0 % (ref 0.0–0.2)

## 2021-04-14 LAB — COMPREHENSIVE METABOLIC PANEL
ALT: 16 U/L (ref 0–44)
AST: 28 U/L (ref 15–41)
Albumin: 3.9 g/dL (ref 3.5–5.0)
Alkaline Phosphatase: 130 U/L — ABNORMAL HIGH (ref 38–126)
Anion gap: 13 (ref 5–15)
BUN: 54 mg/dL — ABNORMAL HIGH (ref 8–23)
CO2: 26 mmol/L (ref 22–32)
Calcium: 8.2 mg/dL — ABNORMAL LOW (ref 8.9–10.3)
Chloride: 93 mmol/L — ABNORMAL LOW (ref 98–111)
Creatinine, Ser: 6.78 mg/dL — ABNORMAL HIGH (ref 0.61–1.24)
GFR, Estimated: 8 mL/min — ABNORMAL LOW (ref >60)
Glucose, Bld: 521 mg/dL (ref 70–99)
Potassium: 7.5 mmol/L (ref 3.5–5.1)
Sodium: 132 mmol/L — ABNORMAL LOW (ref 135–145)
Total Bilirubin: 1.4 mg/dL — ABNORMAL HIGH (ref 0.3–1.2)
Total Protein: 7.2 g/dL (ref 6.5–8.1)

## 2021-04-14 LAB — HEPATITIS B SURFACE ANTIGEN: Hepatitis B Surface Ag: NONREACTIVE

## 2021-04-14 LAB — LACTIC ACID, PLASMA
Lactic Acid, Venous: 3 mmol/L (ref 0.5–1.9)
Lactic Acid, Venous: 2 mmol/L (ref 0.5–1.9)

## 2021-04-14 LAB — CBG MONITORING, ED
Glucose-Capillary: 458 mg/dL — ABNORMAL HIGH (ref 70–99)
Glucose-Capillary: 151 mg/dL — ABNORMAL HIGH (ref 70–99)
Glucose-Capillary: 226 mg/dL — ABNORMAL HIGH (ref 70–99)

## 2021-04-14 LAB — TROPONIN I (HIGH SENSITIVITY)
Troponin I (High Sensitivity): 61 ng/L — ABNORMAL HIGH (ref <18)
Troponin I (High Sensitivity): 1772 ng/L (ref <18)
Troponin I (High Sensitivity): 7235 ng/L (ref <18)
Troponin I (High Sensitivity): 11648 ng/L (ref <18)

## 2021-04-14 LAB — RESP PANEL BY RT-PCR (FLU A&B, COVID) ARPGX2
Influenza A by PCR: NEGATIVE
Influenza B by PCR: NEGATIVE
SARS Coronavirus 2 by RT PCR: NEGATIVE

## 2021-04-14 LAB — POTASSIUM
Potassium: 4.8 mmol/L (ref 3.5–5.1)
Potassium: 4.2 mmol/L (ref 3.5–5.1)
Potassium: 4 mmol/L (ref 3.5–5.1)

## 2021-04-14 LAB — HEPATITIS B SURFACE ANTIBODY,QUALITATIVE: Hep B S Ab: NONREACTIVE

## 2021-04-14 LAB — BRAIN NATRIURETIC PEPTIDE: B Natriuretic Peptide: 1852.5 pg/mL — ABNORMAL HIGH (ref 0.0–100.0)

## 2021-04-14 MED ORDER — ASPIRIN 81 MG PO CHEW
324.0000 mg | CHEWABLE_TABLET | Freq: Once | ORAL | Status: AC
  Administered 2021-04-14: 324 mg via ORAL
  Filled 2021-04-14: qty 4

## 2021-04-14 MED ORDER — ONDANSETRON HCL 4 MG PO TABS: 4.0000 mg | ORAL_TABLET | Freq: Four times a day (QID) | ORAL | Status: DC | PRN

## 2021-04-14 MED ORDER — ONDANSETRON HCL 4 MG/2ML IJ SOLN: 4.0000 mg | Freq: Four times a day (QID) | INTRAMUSCULAR | Status: DC | PRN

## 2021-04-14 MED ORDER — SODIUM CHLORIDE 0.9 % IV SOLN
2.0000 g | Freq: Once | INTRAVENOUS | Status: AC
  Administered 2021-04-14: 2 g via INTRAVENOUS
  Filled 2021-04-14: qty 2

## 2021-04-14 MED ORDER — HEPARIN SODIUM (PORCINE) 5000 UNIT/ML IJ SOLN: 5000.0000 [IU] | Freq: Three times a day (TID) | INTRAMUSCULAR | Status: DC

## 2021-04-14 MED ORDER — HEPARIN (PORCINE) 25000 UT/250ML-% IV SOLN
1300.0000 [IU]/h | INTRAVENOUS | Status: DC
  Administered 2021-04-14: 1400 [IU]/h via INTRAVENOUS
  Administered 2021-04-15 – 2021-04-16 (×2): 1300 [IU]/h via INTRAVENOUS
  Filled 2021-04-14 (×3): qty 250

## 2021-04-14 MED ORDER — ASPIRIN EC 81 MG PO TBEC
81.0000 mg | DELAYED_RELEASE_TABLET | Freq: Every day | ORAL | Status: DC
  Administered 2021-04-15: 81 mg via ORAL
  Filled 2021-04-14: qty 1

## 2021-04-14 MED ORDER — INSULIN GLARGINE-YFGN 100 UNIT/ML ~~LOC~~ SOLN
10.0000 [IU] | Freq: Every day | SUBCUTANEOUS | Status: DC
  Administered 2021-04-14: 10 [IU] via SUBCUTANEOUS
  Filled 2021-04-14 (×2): qty 0.1

## 2021-04-14 MED ORDER — LEVOTHYROXINE SODIUM 50 MCG PO TABS
100.0000 ug | ORAL_TABLET | Freq: Every day | ORAL | Status: DC
  Administered 2021-04-15 – 2021-04-16 (×2): 100 ug via ORAL
  Filled 2021-04-14 (×2): qty 2

## 2021-04-14 MED ORDER — PENTOXIFYLLINE ER 400 MG PO TBCR
400.0000 mg | EXTENDED_RELEASE_TABLET | Freq: Three times a day (TID) | ORAL | Status: DC
  Administered 2021-04-14 – 2021-04-15 (×4): 400 mg via ORAL
  Filled 2021-04-14 (×7): qty 1

## 2021-04-14 MED ORDER — IPRATROPIUM BROMIDE 0.02 % IN SOLN
0.5000 mg | Freq: Four times a day (QID) | RESPIRATORY_TRACT | Status: DC
  Administered 2021-04-15 (×2): 0.5 mg via RESPIRATORY_TRACT
  Filled 2021-04-14 (×4): qty 2.5

## 2021-04-14 MED ORDER — ALTEPLASE 2 MG IJ SOLR
2.0000 mg | Freq: Once | INTRAMUSCULAR | Status: DC | PRN
  Filled 2021-04-14: qty 2

## 2021-04-14 MED ORDER — DULOXETINE HCL 30 MG PO CPEP
30.0000 mg | ORAL_CAPSULE | Freq: Every day | ORAL | Status: DC
  Administered 2021-04-14 – 2021-04-15 (×2): 30 mg via ORAL
  Filled 2021-04-14 (×3): qty 1

## 2021-04-14 MED ORDER — HYDROCODONE-ACETAMINOPHEN 5-325 MG PO TABS
1.0000 | ORAL_TABLET | ORAL | Status: DC | PRN
  Administered 2021-04-14 – 2021-04-15 (×2): 1 via ORAL
  Filled 2021-04-14 (×2): qty 1

## 2021-04-14 MED ORDER — VANCOMYCIN HCL IN DEXTROSE 1-5 GM/200ML-% IV SOLN
1000.0000 mg | Freq: Once | INTRAVENOUS | Status: AC
  Administered 2021-04-14: 1000 mg via INTRAVENOUS
  Filled 2021-04-14: qty 200

## 2021-04-14 MED ORDER — INSULIN ASPART 100 UNIT/ML IJ SOLN
2.0000 [IU] | Freq: Three times a day (TID) | INTRAMUSCULAR | Status: DC
  Administered 2021-04-14 – 2021-04-16 (×5): 2 [IU] via SUBCUTANEOUS
  Filled 2021-04-14 (×5): qty 1

## 2021-04-14 MED ORDER — CLOPIDOGREL BISULFATE 75 MG PO TABS
75.0000 mg | ORAL_TABLET | Freq: Every day | ORAL | Status: DC
  Administered 2021-04-14 – 2021-04-16 (×3): 75 mg via ORAL
  Filled 2021-04-14 (×3): qty 1

## 2021-04-14 MED ORDER — ATORVASTATIN CALCIUM 20 MG PO TABS
80.0000 mg | ORAL_TABLET | Freq: Every day | ORAL | Status: DC
  Administered 2021-04-14 – 2021-04-15 (×2): 80 mg via ORAL
  Filled 2021-04-14 (×2): qty 4

## 2021-04-14 MED ORDER — HEPARIN SODIUM (PORCINE) 1000 UNIT/ML DIALYSIS
1000.0000 [IU] | INTRAMUSCULAR | Status: DC | PRN
  Filled 2021-04-14: qty 1

## 2021-04-14 MED ORDER — INSULIN ASPART 100 UNIT/ML IJ SOLN
0.0000 [IU] | Freq: Three times a day (TID) | INTRAMUSCULAR | Status: DC
  Administered 2021-04-14 – 2021-04-15 (×2): 1 [IU] via SUBCUTANEOUS
  Administered 2021-04-15: 3 [IU] via SUBCUTANEOUS
  Filled 2021-04-14 (×3): qty 1

## 2021-04-14 MED ORDER — SODIUM CHLORIDE 0.9 % IV SOLN: 250.0000 mL | INTRAVENOUS | Status: DC | PRN

## 2021-04-14 MED ORDER — SEVELAMER CARBONATE 800 MG PO TABS
2400.0000 mg | ORAL_TABLET | Freq: Three times a day (TID) | ORAL | Status: DC
  Administered 2021-04-15 (×3): 2400 mg via ORAL
  Filled 2021-04-14 (×6): qty 3

## 2021-04-14 MED ORDER — TORSEMIDE 20 MG PO TABS
100.0000 mg | ORAL_TABLET | ORAL | Status: DC
  Administered 2021-04-15: 100 mg via ORAL
  Filled 2021-04-14: qty 5

## 2021-04-14 MED ORDER — PATIROMER SORBITEX CALCIUM 8.4 G PO PACK
25.2000 g | PACK | Freq: Once | ORAL | Status: AC
  Administered 2021-04-14: 25.2 g via ORAL
  Filled 2021-04-14: qty 3

## 2021-04-14 MED ORDER — TORSEMIDE 20 MG PO TABS
40.0000 mg | ORAL_TABLET | Freq: Every day | ORAL | Status: DC
  Administered 2021-04-14: 40 mg via ORAL
  Filled 2021-04-14: qty 2

## 2021-04-14 MED ORDER — LIDOCAINE HCL (PF) 1 % IJ SOLN: 5.0000 mL | INTRAMUSCULAR | Status: DC | PRN

## 2021-04-14 MED ORDER — CALCIUM GLUCONATE-NACL 1-0.675 GM/50ML-% IV SOLN
1.0000 g | Freq: Once | INTRAVENOUS | Status: AC
  Administered 2021-04-14: 1000 mg via INTRAVENOUS
  Filled 2021-04-14: qty 50

## 2021-04-14 MED ORDER — TORSEMIDE 20 MG PO TABS: 100.0000 mg | ORAL_TABLET | ORAL | Status: DC

## 2021-04-14 MED ORDER — LIDOCAINE-PRILOCAINE 2.5-2.5 % EX CREA: 1.0000 "application " | TOPICAL_CREAM | CUTANEOUS | Status: DC | PRN

## 2021-04-14 MED ORDER — PENTAFLUOROPROP-TETRAFLUOROETH EX AERO
1.0000 "application " | INHALATION_SPRAY | CUTANEOUS | Status: DC | PRN
  Filled 2021-04-14: qty 30

## 2021-04-14 MED ORDER — TRAMADOL HCL 50 MG PO TABS
50.0000 mg | ORAL_TABLET | Freq: Once | ORAL | Status: AC
  Administered 2021-04-14: 50 mg via ORAL
  Filled 2021-04-14: qty 1

## 2021-04-14 MED ORDER — SODIUM CHLORIDE 0.9% FLUSH: 3.0000 mL | INTRAVENOUS | Status: DC | PRN

## 2021-04-14 MED ORDER — RENA-VITE PO TABS
1.0000 | ORAL_TABLET | Freq: Every day | ORAL | Status: DC
  Administered 2021-04-14 – 2021-04-15 (×2): 1 via ORAL
  Filled 2021-04-14 (×3): qty 1

## 2021-04-14 MED ORDER — HEPARIN BOLUS VIA INFUSION
4000.0000 [IU] | Freq: Once | INTRAVENOUS | Status: AC
  Administered 2021-04-14: 4000 [IU] via INTRAVENOUS
  Filled 2021-04-14: qty 4000

## 2021-04-14 MED ORDER — ALBUTEROL SULFATE (2.5 MG/3ML) 0.083% IN NEBU: 2.5000 mg | INHALATION_SOLUTION | RESPIRATORY_TRACT | Status: DC | PRN

## 2021-04-14 MED ORDER — CHLORHEXIDINE GLUCONATE CLOTH 2 % EX PADS
6.0000 | MEDICATED_PAD | Freq: Every day | CUTANEOUS | Status: DC
  Filled 2021-04-14 (×2): qty 6

## 2021-04-14 MED ORDER — SODIUM CHLORIDE 0.9 % IV SOLN: 100.0000 mL | INTRAVENOUS | Status: DC | PRN

## 2021-04-14 MED ORDER — INSULIN ASPART 100 UNIT/ML IJ SOLN
0.0000 [IU] | Freq: Every day | INTRAMUSCULAR | Status: DC
  Administered 2021-04-14: 2 [IU] via SUBCUTANEOUS
  Filled 2021-04-14: qty 1

## 2021-04-14 MED ORDER — CARVEDILOL 25 MG PO TABS
25.0000 mg | ORAL_TABLET | Freq: Two times a day (BID) | ORAL | Status: DC
  Administered 2021-04-14 – 2021-04-16 (×4): 25 mg via ORAL
  Filled 2021-04-14 (×3): qty 1
  Filled 2021-04-14: qty 4

## 2021-04-14 MED ORDER — CINACALCET HCL 30 MG PO TABS
30.0000 mg | ORAL_TABLET | Freq: Every day | ORAL | Status: DC
  Administered 2021-04-14 – 2021-04-15 (×2): 30 mg via ORAL
  Filled 2021-04-14 (×3): qty 1

## 2021-04-14 MED ORDER — SODIUM CHLORIDE 0.9% FLUSH
3.0000 mL | Freq: Two times a day (BID) | INTRAVENOUS | Status: DC
  Administered 2021-04-14 – 2021-04-15 (×2): 3 mL via INTRAVENOUS

## 2021-04-14 MED ORDER — INSULIN ASPART 100 UNIT/ML IJ SOLN
10.0000 [IU] | Freq: Once | INTRAMUSCULAR | Status: AC
  Administered 2021-04-14: 10 [IU] via INTRAVENOUS
  Filled 2021-04-14: qty 1

## 2021-04-14 NOTE — Progress Notes (Signed)
Elink following for code sepsis 

## 2021-04-14 NOTE — ED Triage Notes (Signed)
Pt arrives via [facility], Reports SHOB and Hyperglycmeia, RA sat 96-100. Presently actively vomiting and wheezing audibly.  EMS reports family gave 35u insulin at home.

## 2021-04-14 NOTE — ED Notes (Signed)
Hospitalist messaged regarding critical trop.

## 2021-04-14 NOTE — H&P (Addendum)
[facility] History and Physical  Bryce Garcia [id number] DOB: [birth year] DOA: 04/14/2021  Referring physician: ED  PCP: [place]   Patient is coming from: Home  Chief Complaint: Shortness of breath.  HPI: Bryce Garcia is a 70 y.o. male with past medical history of coronary artery disease, CABG, CHF, CKD, end-stage renal disease on hemodialysis, history of diabetes, presented to hospital with generalized weakness and increasing shortness of breath.  Patient stated that he did have shortness of breath and cough with productive sputum production over the last 24 hours which was worsening in onset.  He complains of severe shortness of breath and could not go to his hemodialysis session today but had not missed his hemodialysis in the previous session.  He goes to dialysis Tuesday Thursday Saturday.  Patient denies any chest pain, dizziness, lightheadedness, fever or chills.  Patient denies any nausea, vomiting, diarrhea.  Denies any urinary urgency frequency or dysuria and he still urinates.  Denies any sick contacts or recent travel.  ED Course: In the ED, patient was afebrile, blood pressure was slightly elevated.  He was hypoxic with pulse ox of 89% on room air and required 2 L of oxygen.  He appeared to be in significant respiratory distress in the ED.  Initial venous blood gas was within normal range.  Blood glucose level was elevated including BNP at 1852.  White cell count was 10.7 hemoglobin of 11.6.  Potassium was elevated at 7.5 with a blood glucose level of 521.  Lactate was elevated at 3.0.  Patient tested negative for COVID and influenza.  Chest x-ray showed pulmonary  congestion with possibility of underlying infection.  Nephrology was consulted and patient was started on hemodialysis while in the ED.  Patient was then considered for admission to the hospital.  Review of Systems:  All systems were reviewed and were negative  unless otherwise mentioned in the HPI  Past Medical History:  Diagnosis Date   CAD (coronary artery disease)    CHF (congestive heart failure) (HCC)    CKD (chronic kidney disease), stage III (HCC)    COPD (chronic obstructive pulmonary disease) ([place])    COVID-19    Diabetes mellitus without complication (HCC)    HFrEF (heart failure with reduced ejection fraction) ([place])    LBBB (left bundle branch block)    Venous ulcer ([place])    Past Surgical History:  Procedure Laterality Date   AVG placement Left    left arm    Social History:  reports that he has quit smoking. He has never used smokeless tobacco. He reports that he does not currently use alcohol. He reports that he does not use drugs.  Allergies  Allergen Reactions   Other Other (See Comments)    Hypoglycemia unawareness Hypoglycemia unawareness    Amlodipine Swelling    Family History  Problem Relation Age of Onset   Diabetes Mother    Hypertension Mother    Hyperlipidemia Mother    Cancer Father      Prior to Admission medications   Medication Sig Start Date End Date Taking? Authorizing Provider  atorvastatin (LIPITOR) 80 MG tablet Take 80 mg by mouth daily. 04/30/19  Yes [provider]  b complex-vitamin c-folic acid (NEPHRO-VITE) 0.8 MG TABS tablet Take 1 tablet by mouth daily. 02/02/21  Yes [provider]  carvedilol (COREG) 25 MG tablet Take 25 mg by mouth 2 (two) times daily. 04/05/19  Yes [provider]  cinacalcet (SENSIPAR) 30 MG tablet Take 30 mg by mouth daily. 03/03/21  Yes [provider]  clopidogrel (PLAVIX) 75 MG tablet Take 75 mg by mouth daily. 04/30/19  Yes [provider]  DULoxetine (CYMBALTA) 30 MG capsule Take 30 mg by mouth daily. 05/22/19  Yes [provider]  EUTHYROX 100 MCG tablet Take 100 mcg by mouth daily. 03/18/19  Yes [provider]  fluocinonide cream (LIDEX) [id number] % Apply 1 application topically 2 (two) times daily. For  rash on legs. 10/24/13  Yes [provider]  insulin NPH Human (NOVOLIN N) 100 UNIT/ML injection Sliding scale 04/17/18  Yes [provider]  insulin regular (NOVOLIN R) 100 units/mL injection Inject 5-20 Units into the skin 3 (three) times daily before meals. Depending on blood glucose level 04/17/18  Yes [provider]  pentoxifylline (TRENTAL) 400 MG CR tablet Take 400 mg by mouth 3 (three) times daily. 06/12/19  Yes [provider]  sevelamer carbonate (RENVELA) 800 MG tablet Take 2,400 mg by mouth 3 (three) times daily. 01/06/21  Yes [provider]  torsemide (DEMADEX) 100 MG tablet Take 100 mg by mouth 3 (three) times a week. 03/20/21  Yes [provider]  torsemide (DEMADEX) 20 MG tablet Take 2 tablets (40 mg total) by mouth daily. 06/29/19  Yes Samuella Cota, MD  traMADol (ULTRAM) 50 MG tablet Take 50 mg by mouth every 6 (six) hours as needed. 05/17/19  Yes [provider]  valACYclovir (VALTREX) 1000 MG tablet Take by mouth. 12/04/20 12/03/21 Yes [provider]  VITAMIN D, CHOLECALCIFEROL, PO Take by mouth. 12/04/20 12/03/21 Yes [provider]  albuterol (VENTOLIN HFA) 108 (90 Base) MCG/ACT inhaler Inhale 2 puffs into the lungs every 4 (four) hours as needed for wheezing. Patient not taking: No sig reported 05/12/18   [provider]  ipratropium (ATROVENT HFA) 17 MCG/ACT inhaler Inhale 2 puffs into the lungs every 6 (six) hours as needed for wheezing. Patient not taking: No sig reported 07/25/19   Thornell Mule, MD    Physical Exam: Vitals:   04/14/21 1600 04/14/21 1615 04/14/21 1645 04/14/21 1700  BP: (!) 170/75 (!) 153/84 (!) 154/71 (!) 157/81  Pulse: 71 69 73 69  Resp: '17 16 16 17  '$ Temp:      TempSrc:      SpO2:      Weight:      Height:       Wt Readings from Last 3 Encounters:  04/14/21 90.3 kg  03/04/21 83 kg  12/15/20 84.6 kg   Body mass index is 30.26 kg/m.  General: Obese  built, not in obvious distress nasal cannula oxygen HENT: Normocephalic, pupils equally reacting to light and accommodation.  No scleral pallor or icterus noted. Oral mucosa is moist.  Chest:    Diminished breath sounds bilaterally.  Coarse breath sounds noted.  CABG scar noted. CVS: S1 &S2 heard. No murmur.  Regular rate and rhythm. Abdomen: Soft, nontender, nondistended.  Bowel sounds are heard.  Liver is not palpable, no abdominal mass palpated Extremities: No cyanosis, clubbing with trace edema.  Peripheral pulses are palpable. Psych: Alert, awake and oriented, normal mood CNS:  No cranial nerve deficits.  Power equal in all extremities.   No cerebellar signs.   Skin: Warm and dry.  No rashes noted.  Labs on Admission:   CBC: Recent Labs  Lab 04/14/21 1110  WBC 10.7*  NEUTROABS 8.6*  HGB 11.6*  HCT 35.6*  MCV 106.6*  PLT [id number]    Basic Metabolic Panel: Recent Labs  Lab 04/14/21 1110 04/14/21 1520 04/14/21 1616  NA 132*  --   --   K 7.5* 4.8 4.2  CL 93*  --   --   CO2 26  --   --   GLUCOSE 521*  --   --   BUN 54*  --   --   CREATININE 6.78*  --   --   CALCIUM 8.2*  --   --     Liver Function Tests: Recent Labs  Lab 04/14/21 1110  AST 28  ALT 16  ALKPHOS 130*  BILITOT 1.4*  PROT 7.2  ALBUMIN 3.9   No results for input(s): LIPASE, AMYLASE in the last 168 hours. No results for input(s): AMMONIA in the last 168 hours.  Cardiac Enzymes: No results for input(s): CKTOTAL, CKMB, CKMBINDEX, TROPONINI in the last 168 hours.  BNP (last 3 results) Recent Labs    04/14/21 1110  BNP 1,852.5*    ProBNP (last 3 results) No results for input(s): PROBNP in the last 8760 hours.  CBG: Recent Labs  Lab 04/14/21 1058  GLUCAP 458*    Lipase     Component Value Date/Time   LIPASE 43 12/15/2020 0731     Urinalysis    Component Value Date/Time   COLORURINE STRAW (A) 12/15/2020 1820   APPEARANCEUR CLEAR (A) 12/15/2020 1820   APPEARANCEUR Clear 01/07/2014  1900   LABSPEC 1.008 12/15/2020 1820   LABSPEC 1.015 01/07/2014 1900   PHURINE 9.0 (H) 12/15/2020 1820   GLUCOSEU >=500 (A) 12/15/2020 1820   GLUCOSEU >=500 01/07/2014 1900   HGBUR NEGATIVE 12/15/2020 1820   BILIRUBINUR NEGATIVE 12/15/2020 1820   BILIRUBINUR Negative 01/07/2014 1900   KETONESUR 5 (A) 12/15/2020 1820   PROTEINUR >=300 (A) 12/15/2020 1820   NITRITE NEGATIVE 12/15/2020 1820   LEUKOCYTESUR NEGATIVE 12/15/2020 1820   LEUKOCYTESUR Negative 01/07/2014 1900     Drugs of Abuse  No results found for: LABOPIA, COCAINSCRNUR, LABBENZ, AMPHETMU, THCU, LABBARB    Radiological Exams on Admission: DG Chest 1 View  Result Date: 04/14/2021 CLINICAL DATA:  SHOB EXAM: CHEST  1 VIEW COMPARISON:  03/04/2021. FINDINGS: Coarse bilateral interstitial and patchy airspace opacities, which appear slightly progressed in the right upper lung. No visible pleural effusions or pneumothorax. Enlarged cardiac silhouette, similar. Median sternotomy. Partially imaged degenerative change of the right shoulder. IMPRESSION: Coarse bilateral interstitial and patchy airspace opacities, which appear slightly progressed in the right upper lung. Primary differential considerations include asymmetric pulmonary edema or pneumonia, likely superimposed on chronic change. CT of the chest could further characterize if clinically indicated. Electronically Signed   By: Margaretha Sheffield M.D.   On: 04/14/2021 11:54    EKG: Personally reviewed by me which shows normal sinus rhythm.  Assessment/Plan Principal Problem:   Volume overload Active Problems:   Anemia in ESRD (end-stage renal disease) (HCC)   Acute on chronic diastolic CHF (congestive heart failure) (HCC)   COPD with acute exacerbation (HCC)   Type II diabetes mellitus with renal manifestations (HCC)   HTN (hypertension)   ESRD (end stage renal disease) on dialysis (HCC)   Acute respiratory distress with acute hypoxic respiratory failure likely secondary to  volume overload and acute on chronic congestive heart failure with possible mild COPD exacerbation..  Patient did have productive sputum but no fever chills or other signs of infection.  Continue volume management with dialysis.  Resume torsemide from home. Will hold off with antibiotics for  now.  Continue bronchodilators.  End-stage renal disease on hemodialysis Tuesday Thursday Saturday.  Patient was given orders and hemodialysis in the ED due to hyperkalemia and volume overload.  Possible acute on chronic diastolic heart failure.  Follow CHF protocol history contact with charting Daily weights.  Volume management with dialysis and torsemide.  Significantly elevated troponin without chest pain.  EKG without acute ischemic changes.  In the context of acute respiratory distress, spoke with cardiology Dr. Saunders Revel regarding consultation.  Will trend further troponins.  Check 2D echocardiogram for wall motion abnormality.  Significant hyperkalemia.  Patient is undergoing hemodialysis.  He received calcium gluconate and Veltassa in the ED. check BMP closely.  Anemia of chronic kidney disease.  Hemoglobin at goal.  Nephrology following.  Diabetes mellitus type 2 with chronic kidney disease and hyperglycemia on presentation..  On NPH and regular insulin at home.  We will put the patient on long-acting insulin sliding scale and mealtime insulin while in the hospital.  Closely monitor blood glucose levels.  DVT Prophylaxis: Heparin subcu  Consultant:  Nephrology Cardiology  Code Status: Full code  Microbiology blood culture has been requested from the ED  Antibiotics: None  Family Communication:  Patients' condition and plan of care including tests being ordered have been discussed with the patient and  who indicate understanding and agree with the plan.  Status is: Inpatient   Severity of Illness: The appropriate patient status for this patient is INPATIENT. Inpatient status is judged to be  reasonable and necessary in order to provide the required intensity of service to ensure the patient's safety. The patient's presenting symptoms, physical exam findings, and initial radiographic and laboratory data in the context of their chronic comorbidities is felt to place them at high risk for further clinical deterioration. Furthermore, it is not anticipated that the patient will be medically stable for discharge from the hospital within 2 midnights of admission.   certify that at the point of admission it is my clinical judgment that the patient will require inpatient hospital care spanning beyond 2 midnights from the point of admission due to high intensity of service, high risk for further deterioration and high frequency of surveillance required.  Signed, Flora Lipps, MD [facility] 04/14/2021

## 2021-04-14 NOTE — Progress Notes (Signed)
Avf with bruit and thrill, pt with approx 2+ generalized edema noted. Avf accessed with 23fneedles with positive blood return and easy flush on arterial and venous lines.

## 2021-04-14 NOTE — Consult Note (Signed)
CODE SEPSIS - PHARMACY COMMUNICATION  **Broad Spectrum Antibiotics should be administered within 1 hour of Sepsis diagnosis**  Time Code Sepsis Called/Page Received: 1223  Antibiotics Ordered: cefepime and vancomycin   Time of 1st antibiotic administration: [place], PharmD Pharmacy Resident  04/14/2021 12:24 PM

## 2021-04-14 NOTE — ED Notes (Signed)
O2 sats  87-89 RA, Added 4L O2, came up to 98, back down to 2L at this time. EDP made aware.

## 2021-04-14 NOTE — Consult Note (Signed)
PHARMACY -  BRIEF ANTIBIOTIC NOTE   Pharmacy has received consult(s) for cefepime and vancomycin from an ED provider.  The patient's profile has been reviewed for ht/wt/allergies/indication/available labs.    One time order(s) placed for cefepime 2g and vancomycin 1g  Further antibiotics/pharmacy consults should be ordered by admitting physician if indicated.                       Thank you, Narda Rutherford, PharmD Pharmacy Resident  04/14/2021 12:32 PM

## 2021-04-14 NOTE — Progress Notes (Signed)
1520 potassium resulted at 1614, bath switched to 2K at 1615, will continue to monitor, sent another postassium level at 1616.

## 2021-04-14 NOTE — Consult Note (Signed)
Cardiology Consultation:   Patient ID: Bryce Garcia MRN: [id number]; DOB: [birth year]  Admit date: 04/14/2021 Date of Consult: 04/14/2021  PCP:  [facility]  Cardiologist:  [facility] Provider:  No care team member to display Electrophysiologist:  None [id number]    Patient Profile:   Bryce Garcia is a 70 y.o. male with a hx of coronary artery disease s/p CABG in 2005 (LIMA to LAD, SVG to diagonal, OM, RCA with only LIMA patent), recent 04/01/2021 catheterization as below, known left bundle branch block, HFrEF with EF of 25% on previous [facility] echo, peripheral vascular disease, DM2, hypertension, hyperlipidemia, COPD, end-stage renal disease on hemodialysis, and who is being seen today for the evaluation of elevated high-sensitivity troponin and volume overload at the request of Dr Louanne Belton.  History of Present Illness:   Bryce Garcia is a 70 year old male with PMH as above.  He has known history of extensive coronary artery disease and is followed at [facility] cardiology s/p 2005 CABG. 01/21/2021 echo with EF 25%, G3 DD, severe eccentric mitral valve regurgitation, and additional findings as below under CV studies.  Per [facility] documentation, given EF recently reduced to 25%, cardiac catheterization was recommended at his last office visit. Subsequent catheterization performed as below with significant three-vessel CAD and severe PAD as outlined under CV studies and the 04/01/2021 catheterization report.  Recommendation was for aggressive secondary prevention.  Complex PCI of subtotal left circumflex occlusion was to be discussed with further recommendations after discussion with team.  Referral to vascular surgery was also recommended for consideration of surgical revascularization of occluded SFAs.  On 04/14/2021, he presented to [facility] emergency department after feeling significantly short of breath upon waking.  He  reported completing his usual hemodialysis on Saturday (Tuesday/Thursday/Saturday HD schedule).  He denied any recent chest pain.  No reported changes in lifestyle, though he did note an increase in eating corn.  He denied any progressive symptoms of overload.  In the emergency department, initial vitals showed sinus bradycardia at 45 bpm, hypotension with BP 91/60, and SPO2 96%.  Initial labs showed potassium 7.5, sodium 132, glucose 521, creatinine 6.78, BUN 54, calcium 8.2, AST 28, ALT 16, alk phosphatase 130, total bilirubin 1.4, BNP 1852.5, high-sensitivity troponin 61  1772.0  7235.0, hemoglobin 11.6, hematocrit 35.6, WBC 10.7, lactic acid 3.0.  EKG showed SB with with known left bundle branch block.  Chest x-ray showed coarse bilateral interstitial lung patchy airspace opacities, appearing slightly progressed in the right upper lung and with consideration that this could reflect asymmetric pulmonary edema or pneumonia, likely superimposed on chronic change.  CT of the chest was recommended to further characterize if clinically indicated.  He was started on hemodialysis with improvement in his symptoms.  Past Medical History:  Diagnosis Date   CAD (coronary artery disease)    CHF (congestive heart failure) (HCC)    CKD (chronic kidney disease), stage III (HCC)    COPD (chronic obstructive pulmonary disease) ([place])    COVID-19    Diabetes mellitus without complication (HCC)    HFrEF (heart failure with reduced ejection fraction) (HCC)    LBBB (left bundle branch block)    Venous ulcer ([place])     Past Surgical History:  Procedure Laterality Date   AVG placement Left    left arm     Home Medications:  Prior to Admission medications   Medication Sig Start Date End Date Taking?  Authorizing Provider  atorvastatin (LIPITOR) 80 MG tablet Take 80 mg by mouth daily. 04/30/19  Yes [provider]  b complex-vitamin c-folic acid (NEPHRO-VITE) 0.8 MG TABS tablet Take 1 tablet by mouth daily.  02/02/21  Yes [provider]  carvedilol (COREG) 25 MG tablet Take 25 mg by mouth 2 (two) times daily. 04/05/19  Yes [provider]  cinacalcet (SENSIPAR) 30 MG tablet Take 30 mg by mouth daily. 03/03/21  Yes [provider]  clopidogrel (PLAVIX) 75 MG tablet Take 75 mg by mouth daily. 04/30/19  Yes [provider]  DULoxetine (CYMBALTA) 30 MG capsule Take 30 mg by mouth daily. 05/22/19  Yes [provider]  EUTHYROX 100 MCG tablet Take 100 mcg by mouth daily. 03/18/19  Yes [provider]  fluocinonide cream (LIDEX) 9.70 % Apply 1 application topically 2 (two) times daily. For rash on legs. 10/24/13  Yes [provider]  insulin NPH Human (NOVOLIN N) 100 UNIT/ML injection Sliding scale 04/17/18  Yes [provider]  insulin regular (NOVOLIN R) 100 units/mL injection Inject 5-20 Units into the skin 3 (three) times daily before meals. Depending on blood glucose level 04/17/18  Yes [provider]  pentoxifylline (TRENTAL) 400 MG CR tablet Take 400 mg by mouth 3 (three) times daily. 06/12/19  Yes [provider]  sevelamer carbonate (RENVELA) 800 MG tablet Take 2,400 mg by mouth 3 (three) times daily. 01/06/21  Yes [provider]  torsemide (DEMADEX) 100 MG tablet Take 100 mg by mouth 3 (three) times a week. 03/20/21  Yes [provider]  torsemide (DEMADEX) 20 MG tablet Take 2 tablets (40 mg total) by mouth daily. 06/29/19  Yes Samuella Cota, MD  traMADol (ULTRAM) 50 MG tablet Take 50 mg by mouth every 6 (six) hours as needed. 05/17/19  Yes [provider]  valACYclovir (VALTREX) 1000 MG tablet Take by mouth. 12/04/20 12/03/21 Yes [provider]  VITAMIN D, CHOLECALCIFEROL, PO Take by mouth. 12/04/20 12/03/21 Yes [provider]  albuterol (VENTOLIN HFA) 108 (90 Base) MCG/ACT inhaler Inhale 2 puffs into the lungs every 4 (four) hours as needed for wheezing. Patient not taking:  No sig reported 05/12/18   [provider]  ipratropium (ATROVENT HFA) 17 MCG/ACT inhaler Inhale 2 puffs into the lungs every 6 (six) hours as needed for wheezing. Patient not taking: No sig reported 07/25/19   Thornell Mule, MD    Inpatient Medications: Scheduled Meds:  [START ON 04/15/2021] Chlorhexidine Gluconate Cloth  6 each Topical Q0600   insulin aspart  0-5 Units Subcutaneous QHS   insulin aspart  0-6 Units Subcutaneous TID WC   insulin aspart  2 Units Subcutaneous TID WC   insulin glargine-yfgn  10 Units Subcutaneous QHS   Continuous Infusions:  sodium chloride     sodium chloride     PRN Meds: sodium chloride, sodium chloride, alteplase, heparin, lidocaine (PF), lidocaine-prilocaine, pentafluoroprop-tetrafluoroeth  Allergies:    Allergies  Allergen Reactions   Other Other (See Comments)    Hypoglycemia unawareness Hypoglycemia unawareness    Amlodipine Swelling    Social History:   Social History   Socioeconomic History   Marital status: Widowed    Spouse name: Not on file   Number of children: Not on file   Years of education: Not on file   Highest education level: Not on file  Occupational History   Not on file  Tobacco Use   Smoking status: Former   Smokeless tobacco: Never  Substance and Sexual Activity   Alcohol use: Not Currently   Drug use: Never   Sexual activity: Not on file  Other Topics Concern   Not on file  Social History Narrative   Not on file   Social Determinants of Health   Financial Resource Strain: Not on file  Food Insecurity: Not on file  Transportation Needs: Not on file  Physical Activity: Not on file  Stress: Not on file  Social Connections: Not on file  Intimate Partner Violence: Not on file    Family History:    Family History  Problem Relation Age of Onset   Diabetes Mother    Hypertension Mother    Hyperlipidemia Mother    Cancer Father      ROS:  Please see the history of present illness.   Review of Systems  Respiratory:  Positive for shortness of breath.   Cardiovascular:  Negative for chest pain, orthopnea and leg swelling.  Neurological:  Negative for dizziness.  All other systems reviewed and are negative.  All other ROS reviewed and negative.     Physical Exam/Data:   Vitals:   04/14/21 1514 04/14/21 1516 04/14/21 1530 04/14/21 1545  BP:  (!) 156/77 (!) 171/75 (!) 148/79  Pulse: 71 71 71 64  Resp: _0 Temp:      TempSrc:      SpO2: 99% 100%    Weight: 90.3 kg     Height:       No intake or output data in the 24 hours ending 04/14/21 1552 Last 3 Weights 04/14/2021 04/14/2021 03/04/2021  Weight (lbs) 199 lb 190 lb 6.4 oz 183 lb  Weight (kg) 90.266 kg 86.365 kg 83.008 kg     Body mass index is 30.26 kg/m.  General: Obese male, no acute distress, receiving hemodialysis HEENT: normal Neck: JVP appears approximately 9 to 10 cm though difficult to visualize due to body habitus Cardiac:  normal S1, S2; soft heart sounds, RRR; 1/6 systolic murmur Lungs: Lung exam deferred in the setting of receiving hemodialysis Abd: Distended, firm Ext: no pitting lower extremity edema.  Skin color change noted in the setting of known PAD. Musculoskeletal:  No deformities,  Skin: warm and dry  Neuro:  no focal abnormalities noted Psych:  Normal affect   EKG:  The EKG was personally reviewed and demonstrates:  Sinus bradycardia, 52 bpm, left bundle branch block (known), ST/T changes versus repolarization abnormalities  Telemetry:  Telemetry was personally reviewed and demonstrates:  SR-SB  Relevant CV Studies: Catheterization [facility]) 04/01/2021 Findings:  1. Severe native 3-vessel disease including known occluded mid LAD and  occluded proximal RCA (not injected). Significant progression of LCx/OM3  disease since last angiogram in 2018 with 50% proximal lesion, a long  diffuse disease up to 80% in the mid-vessel, and subtotal mid-distal  occlusion  2. Patent LIMA to  LAD. SVG to RCA, SVG to diag and SVG to OM are known to  be occluded and were not injected.  3. Severe peripheral artery disease with CTO and extensive calcification  of the bilateral proximal SFAs with distal reconstitution at the level of  the popliteal arteries and 2-vessel runoff below the knee. On the left,  the SFA disease extends up to the SFA/profunda bifurcation.     Recommendations:  1. Aggressive secondary prevention.  2. Will discuss complex PCI of subtotal LCx occlusion.  3. Refer to Vascular Surgery for consideration of surgical  revascularization of occluded SFAs.  Echocardiogram 01/21/2021 Summary    1. Limited study to assess ejection fraction, mitral regurgitation.    2. The left ventricle is upper normal in size with normal wall thickness.    3. The left ventricular systolic function is severely decreased, LVEF is  visually estimated at 25%.    4. There is thinning and akinesis involving the inferior and inferolateral  wall(s).    5. There is grade III diastolic dysfunction (severely elevated filling  pressure).    6. There is moderate to severe eccentric mitral valve regurgitation.    7. The right ventricle is normal in size, with mildly reduced systolic  function.   Laboratory Data:  High Sensitivity Troponin:   Recent Labs  Lab 04/14/21 1110 04/14/21 1311  TROPONINIHS 61* 1,772*     Chemistry Recent Labs  Lab 04/14/21 1110  NA 132*  K 7.5*  CL 93*  CO2 26  GLUCOSE 521*  BUN 54*  CREATININE 6.78*  CALCIUM 8.2*  GFRNONAA 8*  ANIONGAP 13    Recent Labs  Lab 04/14/21 1110  PROT 7.2  ALBUMIN 3.9  AST 28  ALT 16  ALKPHOS 130*  BILITOT 1.4*   Hematology Recent Labs  Lab 04/14/21 1110  WBC 10.7*  RBC 3.34*  HGB 11.6*  HCT 35.6*  MCV 106.6*  MCH 34.7*  MCHC 32.6  RDW 14.6  PLT 222   BNP Recent Labs  Lab 04/14/21 1110  BNP 1,852.5*    DDimer No results for input(s): DDIMER in the last 168 hours.   Radiology/Studies:  DG  Chest 1 View  Result Date: 04/14/2021 CLINICAL DATA:  SHOB EXAM: CHEST  1 VIEW COMPARISON:  03/04/2021. FINDINGS: Coarse bilateral interstitial and patchy airspace opacities, which appear slightly progressed in the right upper lung. No visible pleural effusions or pneumothorax. Enlarged cardiac silhouette, similar. Median sternotomy. Partially imaged degenerative change of the right shoulder. IMPRESSION: Coarse bilateral interstitial and patchy airspace opacities, which appear slightly progressed in the right upper lung. Primary differential considerations include asymmetric pulmonary edema or pneumonia, likely superimposed on chronic change. CT of the chest could further characterize if clinically indicated. Electronically Signed   By: Margaretha Sheffield M.D.   On: 04/14/2021 11:54     Assessment and Plan:   Elevated high-sensitivity troponin Known coronary artery disease --No chest pain.  Reports he woke acutely short of breath this morning and denies missing any hemodialysis visits.  High-sensitivity troponin 7235 and still cycling.  EKG shows known left bundle branch block with ST/T changes above versus repolarization abnormalities.  He has known history of CAD s/p 04/01/2021 catheterization by [facility] with recommendation for further discussion regarding future intervention of his subtotal left circumflex occlusion and aggressive risk factor modification.  Echo ordered by IM and pending with previous [facility] echo showing EF 25%.  Started IV heparin, which should be continued.  Continue to cycle high-sensitivity troponin.  Serial EKGs.  Given recent cath at [facility], no plan for cath at [facility] at this time.  Recommend daily CBC and BMET.  Continue medical management with PTA medications including DAPT with ASA 81 mg daily and Plavix 75 mg daily, carvedilol 25 mg twice daily, Vasotec 20 mg twice daily, and atorvastatin 80 mg daily.  Reassess following hemodialysis with tentative plan to either transfer to [facility] or  discharge with close follow-up by [facility] cardiology pending reassessment in the morning.    Acute on chronic systolic heart failure, ICM --Reports waking with acute shortness of breath.  Recent echo  at [facility] shows EF reduced to 25%.  Subsequent catheterization performed with CAD and plan for possible future intervention.  He is significantly volume up on exam, improving with hemodialysis.  Continue volume management per nephrology.  Continue to monitor I/os, daily weights.  CHF education.  Restart PTA torsemide 100 mg every Monday, Wednesday, Friday, and Saturday or as per nephrology recommendations.  Continue Coreg, Vasotec.  Future escalation of GDMT per [facility] recommendations.  ESRD on HD --Continue hemodialysis as recommended by nephrology.  Daily CBC.  HLD --Continue statin.  PAD --Recent catheterization with possible future intervention.  Continue statin, ASA.   For questions or updates, please contact [place] Please consult [email or web address] for contact info under    Signed, Arvil Chaco, PA-C  04/14/2021 3:52 PM

## 2021-04-14 NOTE — Progress Notes (Signed)
[facility] for heparin infusion initiation & monitoring1 Indication: chest pain/ACS   Patient Measurements: Height: '5\' 8"'$  (172.7 cm) Weight: 89.5 kg (197 lb 6.4 oz) IBW/kg (Calculated) : 68.4 Heparin Dosing Weight: 85.8 kg  Vital Signs: Temp: 96.7 F (35.9 C) (10/18 1202) Temp Source: Axillary (10/18 1202) BP: 158/74 (10/18 1800) Pulse Rate: 69 (10/18 1814)  Labs: Recent Labs    04/14/21 1110 04/14/21 1311 04/14/21 1616  HGB 11.6*  --   --   HCT 35.6*  --   --   PLT 222  --   --   CREATININE 6.78*  --   --   TROPONINIHS 61* 1,772* 7,235*    Estimated Creatinine Clearance: 11.3 mL/min (A) (by C-G formula based on SCr of 6.78 mg/dL (H)).   Medical History: Past Medical History:  Diagnosis Date   CAD (coronary artery disease)    CHF (congestive heart failure) (HCC)    CKD (chronic kidney disease), stage III (HCC)    COPD (chronic obstructive pulmonary disease) ([place])    COVID-19    Diabetes mellitus without complication (HCC)    HFrEF (heart failure with reduced ejection fraction) (HCC)    LBBB (left bundle branch block)    Venous ulcer ([place])     [name] with a hx of CAD s/p 4-vessel CABG with LIMA-LAD, SVG-diagonal, SVG to OM, SVG to RCA with non-STEMI in 07/2015 status post PCI/DES. Pharmacy asked to initiate and monitor Heparin for ACS.  No anticoagulants except  clopidogrel PTA per current med list.   Goal of Therapy:  Heparin level 0.3-0.7 units/ml Monitor platelets by anticoagulation protocol: Yes   Plan:  Give 4000 units bolus x 1 Start heparin infusion at 1400 units/hr (based on an assessment from a previous admission where he was on heparin) Check anti-Xa level in 8 hours and daily while on heparin Continue to monitor H&H and platelets  Bryce Garcia 04/14/2021,6:17 PM

## 2021-04-14 NOTE — ED Notes (Signed)
This pt satting at 89% with good/regular pleth on RA. This pt placed on 2L [place] at this time. Sats increased to 97% with good/regular pleth. MD Cheri Fowler made aware.

## 2021-04-14 NOTE — ED Provider Notes (Signed)
[facility] Emergency Department Provider Note   ____________________________________________   Event Date/Time   First MD Initiated Contact with Patient 04/14/21 1324     (approximate)  I have reviewed the triage vital signs and the nursing notes.   HISTORY  Chief Complaint [facility] w/Hyperglycemia    HPI Bryce Garcia is a 70 y.o. male who presents via EMS for shortness of breath and productive cough for the last 24 hours  LOCATION: Chest DURATION: 24 hours TIMING: Worsening since onset SEVERITY: Severe QUALITY: Shortness of breath CONTEXT: Patient states that he has normal dialysis appointments Tuesday/Thursday/Saturday but was unable to go today due to severe shortness of breath, productive cough, and generalized weakness MODIFYING FACTORS: Any movement worsens this shortness of breath and its partially relieved at rest ASSOCIATED SYMPTOMS: Denies   Per medical record review, patient has history of CAD, CHF, COPD, CKD, diabetes          Past Medical History:  Diagnosis Date   CAD (coronary artery disease)    CHF (congestive heart failure) (HCC)    CKD (chronic kidney disease), stage III (HCC)    COPD (chronic obstructive pulmonary disease) ([place])    COVID-19    Diabetes mellitus without complication ([place])    HFrEF (heart failure with reduced ejection fraction) ([place])    LBBB (left bundle branch block)    Venous ulcer ([place])     Patient Active Problem List   Diagnosis Date Noted   Volume overload 04/14/2021   Hyperkalemia 03/04/2021   Type I diabetes mellitus with nephropathy ([place]) 03/04/2021   ESRD on hemodialysis (HCC)    Generalized weakness    Acute metabolic encephalopathy due to hypoglycemia 12/16/2020   Chronic diastolic CHF (congestive heart failure) ([place]) 12/15/2020   Type II diabetes mellitus with renal manifestations ([place]) [id number]   Acute metabolic encephalopathy [id number]   Hypoglycemia 12/15/2020   Acute on chronic  respiratory failure with hypoxia ([place]) 12/15/2020   Sepsis ([place]) 12/15/2020   HLD (hyperlipidemia) 12/15/2020   HTN (hypertension) 12/15/2020   Hypothyroidism 12/15/2020   Depression 12/15/2020   CAD (coronary artery disease) 12/15/2020   ESRD (end stage renal disease) on dialysis ([place]) 12/15/2020   Hypothermia 12/15/2020   HCAP (healthcare-associated pneumonia) 12/15/2020   Lobar pneumonia ([place])    COPD with acute exacerbation (HCC)    Blurred vision, bilateral    Anemia of chronic kidney failure, stage 4 (severe) ([place]) 07/22/2019   Recurrent pneumonia 07/22/2019   Type 2 DM with CKD stage 4 and hypertension ([place]) 07/22/2019   Acute on chronic diastolic CHF (congestive heart failure) ([place]) 07/22/2019   Acute on chronic heart failure with preserved ejection fraction (HFpEF) (HCC)    Acute renal failure with acute renal cortical necrosis superimposed on stage 3 chronic kidney disease ([place])    Acute respiratory failure with hypoxia ([place]) 06/21/2019   Venous ulcer of left leg ([place]) 06/21/2019   COPD with chronic bronchitis ([place]) 06/21/2019   CKD (chronic kidney disease) stage 4, GFR 15-29 ml/min ([place]) 06/21/2019   AKI (acute kidney injury) ([place]) [id number]   Chronic systolic CHF (congestive heart failure) ([place]) 06/21/2019   History of 2019 novel coronavirus disease (COVID-19) 06/21/2019   Anemia in ESRD (end-stage renal disease) ([place]) 06/21/2019   Acute respiratory failure ([place]) 06/21/2019   Hyperglycemia due to type 1 diabetes mellitus ([place]) 06/21/2019   Hx of CABG 06/21/2019   Non-ST elevation (NSTEMI) myocardial infarction ([place]) 06/21/2019    Past Surgical History:  Procedure  Laterality Date   AVG placement Left    left arm    Prior to Admission medications   Medication Sig Start Date End Date Taking? Authorizing Provider  albuterol (VENTOLIN HFA) 108 (90 Base) MCG/ACT inhaler Inhale 2 puffs into the lungs every 4 (four) hours as needed for wheezing. Patient not taking: No  sig reported 05/12/18   [provider]  atorvastatin (LIPITOR) 80 MG tablet Take 80 mg by mouth daily. 04/30/19   [provider]  b complex-vitamin c-folic acid (NEPHRO-VITE) 0.8 MG TABS tablet Take 1 tablet by mouth daily. 02/02/21   [provider]  carvedilol (COREG) 25 MG tablet Take 25 mg by mouth 2 (two) times daily. 04/05/19   [provider]  cinacalcet (SENSIPAR) 30 MG tablet Take 30 mg by mouth daily. 03/03/21   [provider]  clopidogrel (PLAVIX) 75 MG tablet Take 75 mg by mouth daily. 04/30/19   [provider]  DULoxetine (CYMBALTA) 30 MG capsule Take 30 mg by mouth daily. 05/22/19   [provider]  EUTHYROX 100 MCG tablet Take 100 mcg by mouth daily. 03/18/19   [provider]  fluocinonide cream (LIDEX) [id number] % Apply 1 application topically 2 (two) times daily. For rash on legs. 10/24/13   [provider]  insulin NPH Human (NOVOLIN N) 100 UNIT/ML injection Sliding scale 04/17/18   [provider]  insulin regular (NOVOLIN R) 100 units/mL injection Inject 5-20 Units into the skin 3 (three) times daily before meals. Depending on blood glucose level 04/17/18   [provider]  ipratropium (ATROVENT HFA) 17 MCG/ACT inhaler Inhale 2 puffs into the lungs every 6 (six) hours as needed for wheezing. Patient not taking: No sig reported 07/25/19   Thornell Mule, MD  pentoxifylline (TRENTAL) 400 MG CR tablet Take 400 mg by mouth 3 (three) times daily. 06/12/19   [provider]  sevelamer carbonate (RENVELA) 800 MG tablet Take 2,400 mg by mouth 3 (three) times daily. 01/06/21   [provider]  torsemide (DEMADEX) 20 MG tablet Take 2 tablets (40 mg total) by mouth daily. 06/29/19   Samuella Cota, MD  traMADol (ULTRAM) 50 MG tablet Take 50 mg by mouth every 6 (six) hours as needed. 05/17/19   [provider]  valACYclovir (VALTREX) 1000 MG tablet Take by mouth. 12/04/20 12/03/21   [provider]  VITAMIN D, CHOLECALCIFEROL, PO Take by mouth. 12/04/20 12/03/21  [provider]    Allergies Other and Amlodipine  Family History  Problem Relation Age of Onset   Diabetes Mother    Hypertension Mother    Hyperlipidemia Mother    Cancer Father     Social History Social History   Tobacco Use   Smoking status: Former   Smokeless tobacco: Never  Substance Use Topics   Alcohol use: Not Currently   Drug use: Never    Review of Systems Constitutional: No fever/chills Eyes: No visual changes. ENT: No sore throat. Cardiovascular: Denies chest pain. Respiratory: Endorses shortness of breath. Gastrointestinal: No abdominal pain.  No nausea, no vomiting.  No diarrhea. Genitourinary: Negative for dysuria. Musculoskeletal: Negative for acute arthralgias Skin: Negative for rash. Neurological: Negative for headaches, weakness/numbness/paresthesias in any extremity Psychiatric: Negative for suicidal ideation/homicidal ideation   ____________________________________________   PHYSICAL EXAM:  VITAL SIGNS: ED Triage Vitals  Enc Vitals Group     BP 04/14/21 1105 91/60     Pulse Rate 04/14/21 1105 (!) 45     Resp 04/14/21  1105 (!) 24     Temp 04/14/21 1202 (!) 96.7 F (35.9 C)     Temp Source 04/14/21 1202 Axillary     SpO2 04/14/21 1104 93 %     Weight 04/14/21 1118 190 lb 6.4 oz (86.4 kg)     Height 04/14/21 1118 '5\' 8"'$  (1.727 m)     Head Circumference --      Peak Flow --      Pain Score 04/14/21 1118 0     Pain Loc --      Pain Edu? --      Excl. in [place]? --    Constitutional: Alert and oriented. Well appearing and in no acute distress. Eyes: Conjunctivae are normal. PERRL. Head: Atraumatic. Nose: No congestion/rhinnorhea. Mouth/Throat: Mucous membranes are moist. Neck: No stridor Cardiovascular: Grossly normal heart sounds.  Good peripheral circulation. Respiratory: Increased respiratory effort.  Rhonchi over bilateral lung  fields Gastrointestinal: Soft and nontender. No distention. Musculoskeletal: No obvious deformities Neurologic:  Normal speech and language. No gross focal neurologic deficits are appreciated. Skin:  Skin is warm and dry. No rash noted. Psychiatric: Mood and affect are normal. Speech and behavior are normal.  ____________________________________________   LABS (all labs ordered are listed, but only abnormal results are displayed)  Labs Reviewed  COMPREHENSIVE METABOLIC PANEL - Abnormal; Notable for the following components:      Result Value   Sodium 132 (*)    Potassium 7.5 (*)    Chloride 93 (*)    Glucose, Bld 521 (*)    BUN 54 (*)    Creatinine, Ser 6.78 (*)    Calcium 8.2 (*)    Alkaline Phosphatase 130 (*)    Total Bilirubin 1.4 (*)    GFR, Estimated 8 (*)    All other components within normal limits  BRAIN NATRIURETIC PEPTIDE - Abnormal; Notable for the following components:   B Natriuretic Peptide 1,852.5 (*)    All other components within normal limits  LACTIC ACID, PLASMA - Abnormal; Notable for the following components:   Lactic Acid, Venous 3.0 (*)    All other components within normal limits  LACTIC ACID, PLASMA - Abnormal; Notable for the following components:   Lactic Acid, Venous 2.0 (*)    All other components within normal limits  CBC WITH DIFFERENTIAL/PLATELET - Abnormal; Notable for the following components:   WBC 10.7 (*)    RBC 3.34 (*)    Hemoglobin 11.6 (*)    HCT 35.6 (*)    MCV 106.6 (*)    MCH 34.7 (*)    Neutro Abs 8.6 (*)    All other components within normal limits  CBG MONITORING, ED - Abnormal; Notable for the following components:   Glucose-Capillary 458 (*)    All other components within normal limits  TROPONIN I (HIGH SENSITIVITY) - Abnormal; Notable for the following components:   Troponin I (High Sensitivity) 61 (*)    All other components within normal limits  TROPONIN I (HIGH SENSITIVITY) - Abnormal; Notable for the following  components:   Troponin I (High Sensitivity) 1,772 (*)    All other components within normal limits  RESP PANEL BY RT-PCR (FLU A&B, COVID) [id number]  CULTURE, BLOOD (ROUTINE X 2)  CULTURE, BLOOD (ROUTINE X 2)  BLOOD GAS, VENOUS   ____________________________________________  EKG  ED ECG REPORT I, Naaman Plummer, the attending physician, personally viewed and interpreted this ECG.  Date: 04/14/2021 EKG Time: 1114 Rate: 52 Rhythm: Bradycardic sinus rhythm QRS Axis:  normal Intervals: normal ST/T Wave abnormalities: normal Narrative Interpretation: Bradycardic sinus rhythm.  No evidence of acute ischemia  ____________________________________________  RADIOLOGY  ED MD interpretation: Single view portable chest x-ray shows coarse bilateral interstitial and patchy airspace opacities  Official radiology report(s): DG Chest 1 View  Result Date: 04/14/2021 CLINICAL DATA:  SHOB EXAM: CHEST  1 VIEW COMPARISON:  03/04/2021. FINDINGS: Coarse bilateral interstitial and patchy airspace opacities, which appear slightly progressed in the right upper lung. No visible pleural effusions or pneumothorax. Enlarged cardiac silhouette, similar. Median sternotomy. Partially imaged degenerative change of the right shoulder. IMPRESSION: Coarse bilateral interstitial and patchy airspace opacities, which appear slightly progressed in the right upper lung. Primary differential considerations include asymmetric pulmonary edema or pneumonia, likely superimposed on chronic change. CT of the chest could further characterize if clinically indicated. Electronically Signed   By: Margaretha Sheffield M.D.   On: 04/14/2021 11:54    ____________________________________________   PROCEDURES  Procedure(s) performed (including Critical Care):  .1-3 Lead EKG Interpretation Performed by: Naaman Plummer, MD Authorized by: Naaman Plummer, MD     Interpretation: abnormal     ECG rate:  45   ECG rate assessment:  bradycardic     Rhythm: sinus bradycardia     Ectopy: none     Conduction: normal    CRITICAL CARE Performed by: Naaman Plummer   Total critical care time: 37 minutes  Critical care time was exclusive of separately billable procedures and treating other patients.  Critical care was necessary to treat or prevent imminent or life-threatening deterioration.  Critical care was time spent personally by me on the following activities: development of treatment plan with patient and/or surrogate as well as nursing, discussions with consultants, evaluation of patient's response to treatment, examination of patient, obtaining history from patient or surrogate, ordering and performing treatments and interventions, ordering and review of laboratory studies, ordering and review of radiographic studies, pulse oximetry and re-evaluation of patient's condition.  ____________________________________________   INITIAL IMPRESSION / ASSESSMENT AND PLAN / ED COURSE  As part of my medical decision making, I reviewed the following data within the electronic medical record, if available:  Nursing notes reviewed and incorporated, Labs reviewed, EKG interpreted, Old chart reviewed, Radiograph reviewed and Notes from prior ED visits reviewed and incorporated        Patient is a 70 year old male with the above-stated past medical history presents via EMS for shortness of breath.  Shortly after arrival patient in significant respiratory distress and desaturation to the high 80s and was placed on submental oxygen via nasal cannula.  Patient's chest x-ray shows pulmonary edema with possibility of underlying infection.  Patient is having a productive cough of yellow/green sputum will cover empirically for possible pneumonia.  I spoke to Dr. Holley Raring due to patient signs of volume overload as well as hyperkalemia to 7.5 who agrees to dialyze patient in the emergency department as well as recommends potassium binder and  calcium gluconate.  Given the above symptoms, patient will require admission to the internal medicine service for further evaluation and management.  All patient's questions were answered and he agrees with plan.  Dispo: Admit to medicine      ____________________________________________   FINAL CLINICAL IMPRESSION(S) / ED DIAGNOSES  Final diagnoses:  Acute respiratory failure with hypoxia (HCC)  Acute pulmonary edema (HCC)  Healthcare-associated pneumonia  Hyperkalemia     ED Discharge Orders     None        Note:  This document was prepared using Dragon voice recognition software and may include unintentional dictation errors.    Naaman Plummer, MD 04/14/21 1400

## 2021-04-14 NOTE — Progress Notes (Signed)
[facility]  ROUNDING NOTE   Subjective:   Bryce Garcia is a 70 y.o. male with history of CAD, CHF, COPD, diabetes,  and end stage renal disease on dialysis. Patient present to ED with complaints of weakness and shortness of breath. He has been admitted for Volume overload [E87.70]  Patient is known to our practice from previous admission and receives outpatient dialysis at [facility], supervised by [facility] physicians. He states he has progressively gotten more short of breath and weak over the past couple days. Denies missing any dialysis treatments. Was found to be hyperglycemic on arrival. Denies fever and chills. Denies chest pain and discomfort. Pertinent labs on arrive include sodium 132, potassium 7.5, glucose 521, WBC 107, BNP 1852.  Chest x-ray on arrival shows coarse bilateral interstitial and patchy airspace opacities which are progressive in the right upper lobe.  Suspicious of pulmonary edema or pneumonia, CT of chest recommended.  We have been consulted to manage dialysis needs during this admission.   Objective:  Vital signs in last 24 hours:  Temp:  [96.7 F (35.9 C)] 96.7 F (35.9 C) (10/18 1202) Pulse Rate:  [41-73] 73 (10/18 1645) Resp:  [16-24] 16 (10/18 1645) BP: (91-171)/(60-85) 154/71 (10/18 1645) SpO2:  [89 %-100 %] 100 % (10/18 1516) Weight:  [86.4 kg-90.3 kg] 90.3 kg (10/18 1514)  Weight change:  Filed Weights   04/14/21 1118 04/14/21 1514  Weight: 86.4 kg 90.3 kg    Intake/Output: No intake/output data recorded.   Intake/Output this shift:  No intake/output data recorded.  Physical Exam: General: NAD, resting on stretcher  Head: Normocephalic, atraumatic. Moist oral mucosal membranes  Eyes: Anicteric  Lungs:  Clear to auscultation, normal effort, 2 L O2  Heart: Regular rate and rhythm  Abdomen:  Soft, nontender, nondistended  Extremities: 1+ peripheral edema.  Neurologic: Nonfocal, moving all four extremities  Skin: No  lesions  Access: Left aVF    Basic Metabolic Panel: Recent Labs  Lab 04/14/21 1110 04/14/21 1520 04/14/21 1616  NA 132*  --   --   K 7.5* 4.8 4.2  CL 93*  --   --   CO2 26  --   --   GLUCOSE 521*  --   --   BUN 54*  --   --   CREATININE 6.78*  --   --   CALCIUM 8.2*  --   --     Liver Function Tests: Recent Labs  Lab 04/14/21 1110  AST 28  ALT 16  ALKPHOS 130*  BILITOT 1.4*  PROT 7.2  ALBUMIN 3.9   No results for input(s): LIPASE, AMYLASE in the last 168 hours. No results for input(s): AMMONIA in the last 168 hours.  CBC: Recent Labs  Lab 04/14/21 1110  WBC 10.7*  NEUTROABS 8.6*  HGB 11.6*  HCT 35.6*  MCV 106.6*  PLT 222    Cardiac Enzymes: No results for input(s): CKTOTAL, CKMB, CKMBINDEX, TROPONINI in the last 168 hours.  BNP: Invalid input(s): POCBNP  CBG: Recent Labs  Lab 04/14/21 [place]*    Microbiology: Results for orders placed or performed during the hospital encounter of 04/14/21  Resp Panel by RT-PCR (Flu A&B, Covid) Nasopharyngeal Swab     Status: None   Collection Time: 04/14/21 11:51 AM   Specimen: Nasopharyngeal Swab; Nasopharyngeal(NP) swabs in vial transport medium  Result Value Ref Range Status   SARS Coronavirus 2 by RT PCR NEGATIVE NEGATIVE Final    Comment: (NOTE)  SARS-CoV-2 target nucleic acids are NOT DETECTED.  The SARS-CoV-2 RNA is generally detectable in upper respiratory specimens during the acute phase of infection. The lowest concentration of SARS-CoV-2 viral copies this assay can detect is 138 copies/mL. A negative result does not preclude SARS-Cov-2 infection and should not be used as the sole basis for treatment or other patient management decisions. A negative result may occur with  improper specimen collection/handling, submission of specimen other than nasopharyngeal swab, presence of viral mutation(s) within the areas targeted by this assay, and inadequate number of viral copies(<138 copies/mL).  A negative result must be combined with clinical observations, patient history, and epidemiological information. The expected result is Negative.  Fact Sheet for Patients:  EntrepreneurPulse.com.au  Fact Sheet for Healthcare Providers:  IncredibleEmployment.be  This test is no t yet approved or cleared by the [place] FDA and  has been authorized for detection and/or diagnosis of SARS-CoV-2 by FDA under an Emergency Use Authorization (EUA). This EUA will remain  in effect (meaning this test can be used) for the duration of the COVID-19 declaration under Section 564(b)(1) of the Act, 21 U.S.C.section 360bbb-3(b)(1), unless the authorization is terminated  or revoked sooner.       Influenza A by PCR NEGATIVE NEGATIVE Final   Influenza B by PCR NEGATIVE NEGATIVE Final    Comment: (NOTE) The Xpert Xpress SARS-CoV-2/FLU/RSV plus assay is intended as an aid in the diagnosis of influenza from Nasopharyngeal swab specimens and should not be used as a sole basis for treatment. Nasal washings and aspirates are unacceptable for Xpert Xpress SARS-CoV-2/FLU/RSV testing.  Fact Sheet for Patients: EntrepreneurPulse.com.au  Fact Sheet for Healthcare Providers: IncredibleEmployment.be  This test is not yet approved or cleared by the [place] FDA and has been authorized for detection and/or diagnosis of SARS-CoV-2 by FDA under an Emergency Use Authorization (EUA). This EUA will remain in effect (meaning this test can be used) for the duration of the COVID-19 declaration under Section 564(b)(1) of the Act, 21 U.S.C. section 360bbb-3(b)(1), unless the authorization is terminated or revoked.  Performed at [facility], [address] [place], [place] [address]     Coagulation Studies: No results for input(s): LABPROT, INR in the last 72 hours.  Urinalysis: No results for input(s): COLORURINE,  LABSPEC, PHURINE, GLUCOSEU, HGBUR, BILIRUBINUR, KETONESUR, PROTEINUR, UROBILINOGEN, NITRITE, LEUKOCYTESUR in the last 72 hours.  Invalid input(s): APPERANCEUR    Imaging: DG Chest 1 View  Result Date: 04/14/2021 CLINICAL DATA:  SHOB EXAM: CHEST  1 VIEW COMPARISON:  03/04/2021. FINDINGS: Coarse bilateral interstitial and patchy airspace opacities, which appear slightly progressed in the right upper lung. No visible pleural effusions or pneumothorax. Enlarged cardiac silhouette, similar. Median sternotomy. Partially imaged degenerative change of the right shoulder. IMPRESSION: Coarse bilateral interstitial and patchy airspace opacities, which appear slightly progressed in the right upper lung. Primary differential considerations include asymmetric pulmonary edema or pneumonia, likely superimposed on chronic change. CT of the chest could further characterize if clinically indicated. Electronically Signed   By: Margaretha Sheffield M.D.   On: 04/14/2021 11:54     Medications:    sodium chloride     sodium chloride      [START ON 04/15/2021] Chlorhexidine Gluconate Cloth  6 each Topical Q0600   insulin aspart  0-5 Units Subcutaneous QHS   insulin aspart  0-6 Units Subcutaneous TID WC   insulin aspart  2 Units Subcutaneous TID WC   insulin glargine-yfgn  10 Units Subcutaneous QHS   sodium  chloride, sodium chloride, alteplase, heparin, lidocaine (PF), lidocaine-prilocaine, pentafluoroprop-tetrafluoroeth  Assessment/ Plan:  Mr. ZEBULUN BLYTHE is a 70 y.o.  male with history of CAD, CHF, COPD, diabetes,  and end stage renal disease on dialysis. Patient present to ED with complaints of weakness and shortness of breath. He has been admitted for Volume overload [E87.70]   [facility] [address]/TTS/Lt AVF  End-stage renal disease with hyperkalemia on hemodialysis.  Will maintain outpatient schedule during this admission, if possible.  Potassium on admission 7.5.  Performing urgent hemodialysis at  bedside on 1K bath.  2 hours of treatment with potassium 4.2.  Transition to 2K bath for remainder of treatment.  Pending a.m. potassium, may consider dialysis treatment tomorrow.  2. Anemia of chronic kidney disease  Lab Results  Component Value Date   HGB 11.6 (L) 04/14/2021  Hemoglobin at goal, no need for EPO at this time  3. Secondary Hyperparathyroidism:  Lab Results  Component Value Date   CALCIUM 8.2 (L) 04/14/2021   PHOS 4.1 06/27/2019  Vitamin D and sevelamer ordered outpatient Calcium not at goal. we will obtain updated phosphorus with a.m. labs. Will continue binders with each meal.  4.  Chronic diastolic heart failure Echo from December 2020 shows EF at 50  5. Diabetes mellitus type II with chronic kidney disease: insulin dependent. Home regimen includes NPH and regular insulin. Most recent hemoglobin A1c is 6.8 on 06/21/2019.  Glucose elevated, primary team managing sliding scale    LOS: 0 Melisia Leming 10/18/20224:52 PM

## 2021-04-14 NOTE — ED Notes (Signed)
RT notified of VBG sent to lab at this time.

## 2021-04-15 DIAGNOSIS — J449 Chronic obstructive pulmonary disease, unspecified: Secondary | ICD-10-CM | POA: Diagnosis not present

## 2021-04-15 DIAGNOSIS — I2510 Atherosclerotic heart disease of native coronary artery without angina pectoris: Secondary | ICD-10-CM

## 2021-04-15 DIAGNOSIS — I10 Essential (primary) hypertension: Secondary | ICD-10-CM | POA: Diagnosis not present

## 2021-04-15 DIAGNOSIS — J810 Acute pulmonary edema: Secondary | ICD-10-CM

## 2021-04-15 LAB — CBC
HCT: 34.2 % — ABNORMAL LOW (ref 39.0–52.0)
Hemoglobin: 11.2 g/dL — ABNORMAL LOW (ref 13.0–17.0)
MCH: 33.6 pg (ref 26.0–34.0)
MCHC: 32.7 g/dL (ref 30.0–36.0)
MCV: 102.7 fL — ABNORMAL HIGH (ref 80.0–100.0)
Platelets: 184 10*3/uL (ref 150–400)
RBC: 3.33 MIL/uL — ABNORMAL LOW (ref 4.22–5.81)
RDW: 14.6 % (ref 11.5–15.5)
WBC: 8.4 10*3/uL (ref 4.0–10.5)
nRBC: 0 % (ref 0.0–0.2)

## 2021-04-15 LAB — COMPREHENSIVE METABOLIC PANEL
ALT: 18 U/L (ref 0–44)
AST: 51 U/L — ABNORMAL HIGH (ref 15–41)
Albumin: 3.5 g/dL (ref 3.5–5.0)
Alkaline Phosphatase: 100 U/L (ref 38–126)
Anion gap: 13 (ref 5–15)
BUN: 35 mg/dL — ABNORMAL HIGH (ref 8–23)
CO2: 27 mmol/L (ref 22–32)
Calcium: 8.3 mg/dL — ABNORMAL LOW (ref 8.9–10.3)
Chloride: 96 mmol/L — ABNORMAL LOW (ref 98–111)
Creatinine, Ser: 5.19 mg/dL — ABNORMAL HIGH (ref 0.61–1.24)
GFR, Estimated: 11 mL/min — ABNORMAL LOW (ref >60)
Glucose, Bld: 197 mg/dL — ABNORMAL HIGH (ref 70–99)
Potassium: 4.3 mmol/L (ref 3.5–5.1)
Sodium: 136 mmol/L (ref 135–145)
Total Bilirubin: 1.1 mg/dL (ref 0.3–1.2)
Total Protein: 6.4 g/dL — ABNORMAL LOW (ref 6.5–8.1)

## 2021-04-15 LAB — TROPONIN I (HIGH SENSITIVITY): Troponin I (High Sensitivity): 11461 ng/L (ref <18)

## 2021-04-15 LAB — ECHOCARDIOGRAM COMPLETE
Area-P 1/2: 3.85 cm2
Height: 68 in
S' Lateral: 4.67 cm
Weight: 3158.4 oz

## 2021-04-15 LAB — CBG MONITORING, ED
Glucose-Capillary: 296 mg/dL — ABNORMAL HIGH (ref 70–99)
Glucose-Capillary: 326 mg/dL — ABNORMAL HIGH (ref 70–99)
Glucose-Capillary: 356 mg/dL — ABNORMAL HIGH (ref 70–99)

## 2021-04-15 LAB — HEMOGLOBIN A1C
Hgb A1c MFr Bld: 8.1 % — ABNORMAL HIGH (ref 4.8–5.6)
Mean Plasma Glucose: 186 mg/dL

## 2021-04-15 LAB — HEPARIN LEVEL (UNFRACTIONATED)
Heparin Unfractionated: 0.74 IU/mL — ABNORMAL HIGH (ref 0.30–0.70)
Heparin Unfractionated: 0.62 IU/mL (ref 0.30–0.70)

## 2021-04-15 LAB — PROTIME-INR
INR: 1.1 (ref 0.8–1.2)
Prothrombin Time: 14.4 seconds (ref 11.4–15.2)

## 2021-04-15 LAB — PHOSPHORUS: Phosphorus: 3.7 mg/dL (ref 2.5–4.6)

## 2021-04-15 MED ORDER — INSULIN ASPART 100 UNIT/ML IJ SOLN: 0.0000 [IU] | Freq: Three times a day (TID) | INTRAMUSCULAR | Status: DC

## 2021-04-15 MED ORDER — INSULIN ASPART 100 UNIT/ML IJ SOLN: 0.0000 [IU] | Freq: Every day | INTRAMUSCULAR | Status: DC

## 2021-04-15 MED ORDER — INSULIN ASPART 100 UNIT/ML IJ SOLN
0.0000 [IU] | Freq: Every day | INTRAMUSCULAR | Status: DC
  Administered 2021-04-15: 5 [IU] via SUBCUTANEOUS
  Filled 2021-04-15: qty 1

## 2021-04-15 MED ORDER — INSULIN NPH (HUMAN) (ISOPHANE) 100 UNIT/ML ~~LOC~~ SUSP: 10.0000 [IU] | Freq: Every day | SUBCUTANEOUS | Status: DC

## 2021-04-15 MED ORDER — INSULIN ASPART 100 UNIT/ML IJ SOLN
0.0000 [IU] | Freq: Three times a day (TID) | INTRAMUSCULAR | Status: DC
  Administered 2021-04-16: 5 [IU] via SUBCUTANEOUS
  Filled 2021-04-15: qty 1

## 2021-04-15 MED ORDER — ENALAPRIL MALEATE 10 MG PO TABS
20.0000 mg | ORAL_TABLET | Freq: Two times a day (BID) | ORAL | Status: DC
  Administered 2021-04-15: 20 mg via ORAL
  Filled 2021-04-15: qty 2

## 2021-04-15 MED ORDER — INSULIN GLARGINE-YFGN 100 UNIT/ML ~~LOC~~ SOLN
10.0000 [IU] | Freq: Every day | SUBCUTANEOUS | Status: DC
  Administered 2021-04-15: 10 [IU] via SUBCUTANEOUS
  Filled 2021-04-15 (×2): qty 0.1

## 2021-04-15 NOTE — Progress Notes (Signed)
Inpatient Diabetes Program Recommendations  AACE/ADA: New Consensus Statement on Inpatient Glycemic Control (2015)  Target Ranges:  Prepandial:   less than 140 mg/dL      Peak postprandial:   less than 180 mg/dL (1-2 hours)      Critically ill patients:  140 - 180 mg/dL   Lab Results  Component Value Date   GLUCAP 226 (H) 04/14/2021   HGBA1C 6.8 (H) 06/21/2019    Review of Glycemic Control Results for CRISTOFHER, WEITKAMP (MRN [id number]) as of 04/15/2021 09:49  Ref. Range 04/14/2021 10:58 04/14/2021 18:57 04/14/2021 21:08  Glucose-Capillary Latest Ref Range: 70 - 99 mg/dL 458 (H) 151 (H) 226 (H)  Results for KALUB, RAPPA (MRN [id number]) as of 04/15/2021 09:49  Ref. Range 04/15/2021 08:07  Glucose Latest Ref Range: 70 - 99 mg/dL 197 (H)   Diabetes history: DM 2 Outpatient Diabetes medications: NPH, regular insulin 5-20 units tid  Current orders for Inpatient glycemic control:  Semglee 10 units Novolog 0-6 units tid + hs Novolog 2 units tid meal coverage  Inpatient Diabetes Program Recommendations:    -  Increase Semglee to 12 units -  Increase Novolog meal coverage to 3 units tid if eating >50% of meals  Thanks,  Tama Headings RN, MSN, BC-ADM Inpatient Diabetes Coordinator Team Pager [phone] (8a-5p)

## 2021-04-15 NOTE — Progress Notes (Addendum)
[facility]  ROUNDING NOTE   Subjective:   Bryce Garcia is a 70 y.o. male with history of CAD, CHF, COPD, diabetes,  and end stage renal disease on dialysis. Patient present to ED with complaints of weakness and shortness of breath. He has been admitted for Volume overload [E87.70]  Patient is known to our practice from previous admission and receives outpatient dialysis at [facility], supervised by [facility] physicians.   Patient seen sitting at side of bed, eating breakfast States he feels better today, denies shortness of breath, nausea, vomiting Weaned to room air Potassium 4.3 today   Objective:  Vital signs in last 24 hours:  Pulse Rate:  [53-73] 58 (10/19 1200) Resp:  [13-32] 14 (10/19 1200) BP: (134-171)/(61-139) 134/62 (10/19 1200) SpO2:  [96 %-100 %] 98 % (10/19 1200) Weight:  [89.5 kg-90.3 kg] 89.5 kg (10/18 1814)  Weight change:  Filed Weights   04/14/21 1514 04/14/21 1800 04/14/21 1814  Weight: 90.3 kg 89.5 kg 89.5 kg    Intake/Output: I/O last 3 completed shifts: In: 199.2 [I.V.:199.2] Out: 1150 [Urine:150; Other:1000]   Intake/Output this shift:  No intake/output data recorded.  Physical Exam: General: NAD, sitting at bedside  Head: Normocephalic, atraumatic. Moist oral mucosal membranes  Eyes: Anicteric  Lungs:  Clear to auscultation, normal effort  Heart: Regular rate and rhythm  Abdomen:  Soft, nontender, nondistended  Extremities: no peripheral edema.  Neurologic: Nonfocal, moving all four extremities  Skin: No lesions  Access: Left aVF    Basic Metabolic Panel: Recent Labs  Lab 04/14/21 1110 04/14/21 1520 04/14/21 1616 04/14/21 1824 04/15/21 0807  NA 132*  --   --   --  136  K 7.5* 4.8 4.2 4.0 4.3  CL 93*  --   --   --  96*  CO2 26  --   --   --  27  GLUCOSE 521*  --   --   --  197*  BUN 54*  --   --   --  35*  CREATININE 6.78*  --   --   --  5.19*  CALCIUM 8.2*  --   --   --  8.3*     Liver Function  Tests: Recent Labs  Lab 04/14/21 1110 04/15/21 0807  AST 28 51*  ALT 16 18  ALKPHOS 130* 100  BILITOT 1.4* 1.1  PROT 7.2 6.4*  ALBUMIN 3.9 3.5    No results for input(s): LIPASE, AMYLASE in the last 168 hours. No results for input(s): AMMONIA in the last 168 hours.  CBC: Recent Labs  Lab 04/14/21 1110 04/15/21 0807  WBC 10.7* 8.4  NEUTROABS 8.6*  --   HGB 11.6* 11.2*  HCT 35.6* 34.2*  MCV 106.6* 102.7*  PLT 222 184     Cardiac Enzymes: No results for input(s): CKTOTAL, CKMB, CKMBINDEX, TROPONINI in the last 168 hours.  BNP: Invalid input(s): POCBNP  CBG: Recent Labs  Lab 04/14/21 1058 04/14/21 1857 04/14/21 2108  GLUCAP 458* 151* 33*     Microbiology: Results for orders placed or performed during the hospital encounter of 04/14/21  Resp Panel by RT-PCR (Flu A&B, Covid) Nasopharyngeal Swab     Status: None   Collection Time: 04/14/21 11:51 AM   Specimen: Nasopharyngeal Swab; Nasopharyngeal(NP) swabs in vial transport medium  Result Value Ref Range Status   SARS Coronavirus 2 by RT PCR NEGATIVE NEGATIVE Final    Comment: (NOTE) SARS-CoV-2 target nucleic acids are NOT DETECTED.  The  SARS-CoV-2 RNA is generally detectable in upper respiratory specimens during the acute phase of infection. The lowest concentration of SARS-CoV-2 viral copies this assay can detect is 138 copies/mL. A negative result does not preclude SARS-Cov-2 infection and should not be used as the sole basis for treatment or other patient management decisions. A negative result may occur with  improper specimen collection/handling, submission of specimen other than nasopharyngeal swab, presence of viral mutation(s) within the areas targeted by this assay, and inadequate number of viral copies(<138 copies/mL). A negative result must be combined with clinical observations, patient history, and epidemiological information. The expected result is Negative.  Fact Sheet for Patients:   EntrepreneurPulse.com.au  Fact Sheet for Healthcare Providers:  IncredibleEmployment.be  This test is no t yet approved or cleared by the [place] FDA and  has been authorized for detection and/or diagnosis of SARS-CoV-2 by FDA under an Emergency Use Authorization (EUA). This EUA will remain  in effect (meaning this test can be used) for the duration of the COVID-19 declaration under Section 564(b)(1) of the Act, 21 U.S.C.section 360bbb-3(b)(1), unless the authorization is terminated  or revoked sooner.       Influenza A by PCR NEGATIVE NEGATIVE Final   Influenza B by PCR NEGATIVE NEGATIVE Final    Comment: (NOTE) The Xpert Xpress SARS-CoV-2/FLU/RSV plus assay is intended as an aid in the diagnosis of influenza from Nasopharyngeal swab specimens and should not be used as a sole basis for treatment. Nasal washings and aspirates are unacceptable for Xpert Xpress SARS-CoV-2/FLU/RSV testing.  Fact Sheet for Patients: EntrepreneurPulse.com.au  Fact Sheet for Healthcare Providers: IncredibleEmployment.be  This test is not yet approved or cleared by the [place] FDA and has been authorized for detection and/or diagnosis of SARS-CoV-2 by FDA under an Emergency Use Authorization (EUA). This EUA will remain in effect (meaning this test can be used) for the duration of the COVID-19 declaration under Section 564(b)(1) of the Act, 21 U.S.C. section 360bbb-3(b)(1), unless the authorization is terminated or revoked.  Performed at [facility], [address] [place], [place] [address]   Blood culture (routine x 2)     Status: None (Preliminary result)   Collection Time: 04/14/21 12:20 PM   Specimen: BLOOD  Result Value Ref Range Status   Specimen Description BLOOD RIGHT ANTECUBITAL  Final   Special Requests   Final    BOTTLES DRAWN AEROBIC AND ANAEROBIC Blood Culture adequate volume   Culture    Final    NO GROWTH < 24 HOURS Performed at [facility], [address], [place], [place] [address]    Report Status PENDING  Incomplete  Blood culture (routine x 2)     Status: None (Preliminary result)   Collection Time: 04/14/21 12:51 PM   Specimen: BLOOD  Result Value Ref Range Status   Specimen Description BLOOD BLOOD RIGHT WRIST  Final   Special Requests   Final    BOTTLES DRAWN AEROBIC AND ANAEROBIC Blood Culture adequate volume   Culture   Final    NO GROWTH < 24 HOURS Performed at [facility], [address] [place],  [address]    Report Status PENDING  Incomplete    Coagulation Studies: Recent Labs    04/15/21 0807  LABPROT 14.4  INR 1.1    Urinalysis: No results for input(s): COLORURINE, LABSPEC, PHURINE, GLUCOSEU, HGBUR, BILIRUBINUR, KETONESUR, PROTEINUR, UROBILINOGEN, NITRITE, LEUKOCYTESUR in the last 72 hours.  Invalid input(s): APPERANCEUR    Imaging: DG Chest 1  View  Result Date: 04/14/2021 CLINICAL DATA:  SHOB EXAM: CHEST  1 VIEW COMPARISON:  03/04/2021. FINDINGS: Coarse bilateral interstitial and patchy airspace opacities, which appear slightly progressed in the right upper lung. No visible pleural effusions or pneumothorax. Enlarged cardiac silhouette, similar. Median sternotomy. Partially imaged degenerative change of the right shoulder. IMPRESSION: Coarse bilateral interstitial and patchy airspace opacities, which appear slightly progressed in the right upper lung. Primary differential considerations include asymmetric pulmonary edema or pneumonia, likely superimposed on chronic change. CT of the chest could further characterize if clinically indicated. Electronically Signed   By: Margaretha Sheffield M.D.   On: 04/14/2021 11:54   ECHOCARDIOGRAM COMPLETE  Result Date: 04/15/2021    ECHOCARDIOGRAM REPORT   Patient Name:   Bryce Garcia Date of Exam: 04/14/2021 Medical Rec #:  [id number]      Height:       68.0 in Accession #:     [id number]     Weight:       197.4 lb Date of Birth:  Jan 30, 1951      BSA:          2.032 m Patient Age:    51 years       BP:           146/74 mmHg Patient Gender: M              HR:           68 bpm. Exam Location:  [facility] Procedure: 2D Echo, Cardiac Doppler and Color Doppler Indications:     [id number] Acute Systolic CHF  History:         Patient has prior history of Echocardiogram examinations, most                  recent 06/21/2019. CHF, CAD, COPD, Arrythmias:LBBB; Risk                  Factors:Diabetes. History of COVID-19.  Sonographer:     Cresenciano Lick RDCS Referring Phys:  [id number] Arvil Chaco Diagnosing Phys: Nelva Bush MD IMPRESSIONS  1. Left ventricular ejection fraction, by estimation, is 25 to 30%. The left ventricle has severely decreased function. The left ventricle demonstrates regional wall motion abnormalities (see scoring diagram/findings for description). The left ventricular internal cavity size was borderline dilated. Left ventricular diastolic parameters are consistent with Grade II diastolic dysfunction (pseudonormalization). Elevated left atrial pressure. There is global hypokinesis with akinesis of the inferior and inferolateral walls.  2. Right ventricular systolic function is moderately reduced. The right ventricular size is normal. Tricuspid regurgitation signal is inadequate for assessing PA pressure.  3. Left atrial size was mildly dilated.  4. The mitral valve is normal in structure. Mild to moderate mitral valve regurgitation; degree of regurgitation may be underestimated by jet eccentricity.  5. The aortic valve is tricuspid. There is mild calcification of the aortic valve. There is moderate thickening of the aortic valve. Aortic valve regurgitation is not visualized. Mild to moderate aortic valve sclerosis/calcification is present, without any evidence of aortic stenosis.  6. The inferior vena cava is normal in size with <50% respiratory variability, suggesting  right atrial pressure of 8 mmHg. FINDINGS  Left Ventricle: Left ventricular ejection fraction, by estimation, is 25 to 30%. The left ventricle has severely decreased function. The left ventricle demonstrates regional wall motion abnormalities. The left ventricular internal cavity size was borderline dilated. There is no left ventricular hypertrophy. Left ventricular diastolic parameters are consistent with Grade II  diastolic dysfunction (pseudonormalization). Elevated left atrial pressure.  LV Wall Scoring: There is global hypokinesis with akinesis of the inferior and inferolateral walls. Right Ventricle: The right ventricular size is normal. No increase in right ventricular wall thickness. Right ventricular systolic function is moderately reduced. Tricuspid regurgitation signal is inadequate for assessing PA pressure. Left Atrium: Left atrial size was mildly dilated. Right Atrium: Right atrial size was normal in size. Pericardium: There is no evidence of pericardial effusion. Mitral Valve: The mitral valve is normal in structure. Mild to moderate mitral valve regurgitation. Tricuspid Valve: The tricuspid valve is not well visualized. Tricuspid valve regurgitation is trivial. Aortic Valve: The aortic valve is tricuspid. There is mild calcification of the aortic valve. There is moderate thickening of the aortic valve. Aortic valve regurgitation is not visualized. Mild to moderate aortic valve sclerosis/calcification is present, without any evidence of aortic stenosis. Pulmonic Valve: The pulmonic valve was not well visualized. Pulmonic valve regurgitation is not visualized. No evidence of pulmonic stenosis. Aorta: The aortic root and ascending aorta are structurally normal, with no evidence of dilitation. Pulmonary Artery: The pulmonary artery is not well seen. Venous: The inferior vena cava is normal in size with less than 50% respiratory variability, suggesting right atrial pressure of 8 mmHg. IAS/Shunts: The  interatrial septum was not well visualized.  LEFT VENTRICLE PLAX 2D LVIDd:         5.54 cm   Diastology LVIDs:         4.67 cm   LV e' medial:    3.70 cm/s LV PW:         0.88 cm   LV E/e' medial:  33.8 LV IVS:        0.81 cm   LV e' lateral:   8.05 cm/s LVOT diam:     2.10 cm   LV E/e' lateral: 15.5 LV SV:         47 LV SV Index:   23 LVOT Area:     3.46 cm  RIGHT VENTRICLE            IVC RV Basal diam:  3.71 cm    IVC diam: 1.90 cm RV S prime:     9.36 cm/s TAPSE (M-mode): 1.2 cm LEFT ATRIUM             Index        RIGHT ATRIUM           Index LA diam:        4.50 cm 2.21 cm/m   RA Area:     16.80 cm LA Vol (A2C):   60.0 ml 29.52 ml/m  RA Volume:   48.50 ml  23.86 ml/m LA Vol (A4C):   67.4 ml 33.16 ml/m LA Biplane Vol: 63.9 ml 31.44 ml/m  AORTIC VALVE LVOT Vmax:   75.80 cm/s LVOT Vmean:  52.200 cm/s LVOT VTI:    0.137 m  AORTA Ao Root diam: 3.30 cm Ao Asc diam:  3.40 cm MITRAL VALVE MV Area (PHT): 3.85 cm     SHUNTS MV Decel Time: 197 msec     Systemic VTI:  0.14 m MV E velocity: 125.00 cm/s  Systemic Diam: 2.10 cm MV A velocity: 68.90 cm/s MV E/A ratio:  1.81 Harrell Gave End MD Electronically signed by Nelva Bush MD Signature Date/Time: 04/15/2021/7:49:09 AM    Final      Medications:    sodium chloride     sodium chloride     sodium chloride  heparin 1,300 Units/hr (04/15/21 1021)    aspirin EC  81 mg Oral Daily   atorvastatin  80 mg Oral Daily   carvedilol  25 mg Oral BID   Chlorhexidine Gluconate Cloth  6 each Topical Q0600   cinacalcet  30 mg Oral Daily   clopidogrel  75 mg Oral Daily   DULoxetine  30 mg Oral Daily   enalapril  20 mg Oral BID   insulin aspart  0-5 Units Subcutaneous QHS   insulin aspart  0-6 Units Subcutaneous TID WC   insulin aspart  2 Units Subcutaneous TID WC   insulin glargine-yfgn  10 Units Subcutaneous QHS   ipratropium  0.5 mg Nebulization Q6H   levothyroxine  100 mcg Oral Daily   multivitamin  1 tablet Oral QHS   pentoxifylline  400 mg Oral  TID   sevelamer carbonate  2,400 mg Oral TID with meals   sodium chloride flush  3 mL Intravenous Q12H   torsemide  100 mg Oral Once per day on Mon Wed Fri Sat   sodium chloride, sodium chloride, sodium chloride, albuterol, alteplase, heparin, HYDROcodone-acetaminophen, lidocaine (PF), lidocaine-prilocaine, ondansetron **OR** ondansetron (ZOFRAN) IV, pentafluoroprop-tetrafluoroeth, sodium chloride flush  Assessment/ Plan:  Mr. ANUJ GUNNOE is a 70 y.o.  male with history of CAD, CHF, COPD, diabetes,  and end stage renal disease on dialysis. Patient present to ED with complaints of weakness and shortness of breath. He has been admitted for Volume overload [E87.70]   [facility] [address]/TTS/Lt AVF  End-stage renal disease with hyperkalemia on hemodialysis.  Will maintain outpatient schedule during this admission, if possible.  Potassium on admission 7.5.  Received urgent dialysis.  Post potassium 4.0.  No acute need for dialysis today.  Next treatment scheduled for Thursday.  We will consider prescribing Veltassa at discharge  2. Anemia of chronic kidney disease  Lab Results  Component Value Date   HGB 11.2 (L) 04/15/2021  No indication for ESA  3. Secondary Hyperparathyroidism:  Lab Results  Component Value Date   CALCIUM 8.3 (L) 04/15/2021   PHOS 4.1 06/27/2019  Vitamin D and sevelamer ordered outpatient Calcium below target Sevelamer restarted yesterday with meals  4.  Chronic diastolic heart failure Echo from December 2020 shows EF at 50  5. Diabetes mellitus type II with chronic kidney disease: insulin dependent. Home regimen includes NPH and regular insulin. Most recent hemoglobin A1c is 6.8 on 06/21/2019.  Glucose remains elevated    LOS: 1 Domanic Matusek 10/19/202212:50 PM

## 2021-04-15 NOTE — Consult Note (Signed)
   Heart Failure Nurse Navigator Note  HFrEF 25 to 30%.  Grade 2 diastolic dysfunction.  Right ventricular systolic function is mildly reduced.  Mild to moderate aortic sclerosis.  He presented to the emergency room with worsening shortness of breath and weakness.  States that he had not had any of his dialysis runs.  Comorbidities:  Coronary artery disease/CABG Kidney disease end-stage renal on dialysis Tuesday Thursday Saturday Diabetes COPD Obesity Left bundle branch block  Labs:  Sodium 136, potassium 4.3, chloride 96, CO2 27, BUN 35, creatinine 5.19, BNP 1852, albumin 3.5, troponin 7235, 11,461.  Medications:  Aspirin 81 mg daily Torsemide 100 mg daily Plavix 75 mg daily Atorvastatin 80 mg daily Coreg 25 mg twice a day    Initial meeting with patient in the emergency room, he was sitting up in a chair at bedside in no acute distress.  He states that he does not weigh himself on a daily basis.  I asked him if he had ever been given instructions if he noted sudden weight gain what to do with his water pill.  He states he does not remember receiving any instructions like that.  Discussed his daily fluid intake and from what he was telling me he does not exceed a liter and a half in a 24-hour period.  States that he does not use salt at the does admit to once in a while eating food that were higher in sodium content.  He states that he is followed by the doctors at [facility], has not been involved in their heart failure clinic.  Offered our outpatient heart failure clinic to him and he states that he wants to to think about it, he has an appointment to see cardiology on this Friday for plans on intervention for his legs and his heart.  He had no further questions.  Pricilla Riffle RN [facility]

## 2021-04-15 NOTE — Progress Notes (Signed)
Progress Note  Patient Name: Bryce Garcia Date of Encounter: 04/15/2021  Primary Cardiologist: [facility]  Subjective   Feels significantly improved today. No CP, breathing improved, energy improved.  Follows up with [facility] on Friday   Inpatient Medications    Scheduled Meds:  aspirin EC  81 mg Oral Daily   atorvastatin  80 mg Oral Daily   carvedilol  25 mg Oral BID   Chlorhexidine Gluconate Cloth  6 each Topical Q0600   cinacalcet  30 mg Oral Daily   clopidogrel  75 mg Oral Daily   DULoxetine  30 mg Oral Daily   insulin aspart  0-5 Units Subcutaneous QHS   insulin aspart  0-6 Units Subcutaneous TID WC   insulin aspart  2 Units Subcutaneous TID WC   insulin glargine-yfgn  10 Units Subcutaneous QHS   ipratropium  0.5 mg Nebulization Q6H   levothyroxine  100 mcg Oral Daily   multivitamin  1 tablet Oral QHS   pentoxifylline  400 mg Oral TID   sevelamer carbonate  2,400 mg Oral TID with meals   sodium chloride flush  3 mL Intravenous Q12H   torsemide  100 mg Oral Once per day on Mon Wed Fri Sat   Continuous Infusions:  sodium chloride     sodium chloride     sodium chloride     heparin 1,300 Units/hr (04/15/21 0851)   PRN Meds: sodium chloride, sodium chloride, sodium chloride, albuterol, alteplase, heparin, HYDROcodone-acetaminophen, lidocaine (PF), lidocaine-prilocaine, ondansetron **OR** ondansetron (ZOFRAN) IV, pentafluoroprop-tetrafluoroeth, sodium chloride flush   Vital Signs    Vitals:   04/15/21 0200 04/15/21 0500 04/15/21 0600 04/15/21 0700  BP: (!) 154/69 (!) 155/67 (!) 163/139 (!) 149/66  Pulse: 62 60 (!) 58 62  Resp: (!) 32 '17 14 13  '$ Temp:      TempSrc:      SpO2: 100% 100% 100% 100%  Weight:      Height:        Intake/Output Summary (Last 24 hours) at 04/15/2021 0959 Last data filed at 04/15/2021 [id number] Gross per 24 hour  Intake 199.17 ml  Output 1150 ml  Net -950.83 ml   Last 3 Weights 04/14/2021 04/14/2021 04/14/2021  Weight (lbs) 197 lb 6.4 oz  197 lb 6.4 oz 199 lb  Weight (kg) 89.54 kg 89.54 kg 90.266 kg      Telemetry    Left bundle branch block, PVCs, 60s to 70s- Personally Reviewed  ECG    No new tracings- Personally Reviewed  Physical Exam   GEN: No acute distress.  Lying in bed Neck: JVP approximately 8 to 9 cm Cardiac: RRR, 1/6 systolic murmur.  No, rubs, or gallops.  Respiratory: Clear to auscultation bilaterally. GI: Moderately distended/firm MS: No edema, bilateral skin color changes associated with known PAD; No deformity. Neuro:  Nonfocal  Psych: Normal affect   Labs    High Sensitivity Troponin:   Recent Labs  Lab 04/14/21 1110 04/14/21 1311 04/14/21 1616 04/14/21 1824 04/15/21 0807  TROPONINIHS 61* 1,772* 7,235* 11,648* 11,461*      Chemistry Recent Labs  Lab 04/14/21 1110 04/14/21 1520 04/14/21 1616 04/14/21 1824 04/15/21 0807  NA 132*  --   --   --  136  K 7.5*   < > 4.2 4.0 4.3  CL 93*  --   --   --  96*  CO2 26  --   --   --  27  GLUCOSE 521*  --   --   --  197*  BUN 54*  --   --   --  35*  CREATININE 6.78*  --   --   --  5.19*  CALCIUM 8.2*  --   --   --  8.3*  PROT 7.2  --   --   --  6.4*  ALBUMIN 3.9  --   --   --  3.5  AST 28  --   --   --  51*  ALT 16  --   --   --  18  ALKPHOS 130*  --   --   --  100  BILITOT 1.4*  --   --   --  1.1  GFRNONAA 8*  --   --   --  11*  ANIONGAP 13  --   --   --  13   < > = values in this interval not displayed.     Hematology Recent Labs  Lab 04/14/21 1110 04/15/21 0807  WBC 10.7* 8.4  RBC 3.34* 3.33*  HGB 11.6* 11.2*  HCT 35.6* 34.2*  MCV 106.6* 102.7*  MCH 34.7* 33.6  MCHC 32.6 32.7  RDW 14.6 14.6  PLT 222 184    BNP Recent Labs  Lab 04/14/21 1110  BNP 1,852.5*     DDimer No results for input(s): DDIMER in the last 168 hours.   Radiology    DG Chest 1 View  Result Date: 04/14/2021 CLINICAL DATA:  SHOB EXAM: CHEST  1 VIEW COMPARISON:  03/04/2021. FINDINGS: Coarse bilateral interstitial and patchy airspace  opacities, which appear slightly progressed in the right upper lung. No visible pleural effusions or pneumothorax. Enlarged cardiac silhouette, similar. Median sternotomy. Partially imaged degenerative change of the right shoulder. IMPRESSION: Coarse bilateral interstitial and patchy airspace opacities, which appear slightly progressed in the right upper lung. Primary differential considerations include asymmetric pulmonary edema or pneumonia, likely superimposed on chronic change. CT of the chest could further characterize if clinically indicated. Electronically Signed   By: Margaretha Sheffield M.D.   On: 04/14/2021 11:54   ECHOCARDIOGRAM COMPLETE  Result Date: 04/15/2021    ECHOCARDIOGRAM REPORT   Patient Name:   Bryce Garcia Date of Exam: 04/14/2021 Medical Rec #:  [id number]      Height:       68.0 in Accession #:    [id number]     Weight:       197.4 lb Date of Birth:  21-Jun-1951      BSA:          2.032 m Patient Age:    8 years       BP:           146/74 mmHg Patient Gender: M              HR:           68 bpm. Exam Location:  [facility] Procedure: 2D Echo, Cardiac Doppler and Color Doppler Indications:     [id number] Acute Systolic CHF  History:         Patient has prior history of Echocardiogram examinations, most                  recent 06/21/2019. CHF, CAD, COPD, Arrythmias:LBBB; Risk                  Factors:Diabetes. History of COVID-19.  Sonographer:     Cresenciano Lick RDCS Referring Phys:  [id number] Arvil Chaco Diagnosing Phys: Nelva Bush MD IMPRESSIONS  1.  Left ventricular ejection fraction, by estimation, is 25 to 30%. The left ventricle has severely decreased function. The left ventricle demonstrates regional wall motion abnormalities (see scoring diagram/findings for description). The left ventricular internal cavity size was borderline dilated. Left ventricular diastolic parameters are consistent with Grade II diastolic dysfunction (pseudonormalization). Elevated left atrial  pressure. There is global hypokinesis with akinesis of the inferior and inferolateral walls.  2. Right ventricular systolic function is moderately reduced. The right ventricular size is normal. Tricuspid regurgitation signal is inadequate for assessing PA pressure.  3. Left atrial size was mildly dilated.  4. The mitral valve is normal in structure. Mild to moderate mitral valve regurgitation; degree of regurgitation may be underestimated by jet eccentricity.  5. The aortic valve is tricuspid. There is mild calcification of the aortic valve. There is moderate thickening of the aortic valve. Aortic valve regurgitation is not visualized. Mild to moderate aortic valve sclerosis/calcification is present, without any evidence of aortic stenosis.  6. The inferior vena cava is normal in size with <50% respiratory variability, suggesting right atrial pressure of 8 mmHg. FINDINGS  Left Ventricle: Left ventricular ejection fraction, by estimation, is 25 to 30%. The left ventricle has severely decreased function. The left ventricle demonstrates regional wall motion abnormalities. The left ventricular internal cavity size was borderline dilated. There is no left ventricular hypertrophy. Left ventricular diastolic parameters are consistent with Grade II diastolic dysfunction (pseudonormalization). Elevated left atrial pressure.  LV Wall Scoring: There is global hypokinesis with akinesis of the inferior and inferolateral walls. Right Ventricle: The right ventricular size is normal. No increase in right ventricular wall thickness. Right ventricular systolic function is moderately reduced. Tricuspid regurgitation signal is inadequate for assessing PA pressure. Left Atrium: Left atrial size was mildly dilated. Right Atrium: Right atrial size was normal in size. Pericardium: There is no evidence of pericardial effusion. Mitral Valve: The mitral valve is normal in structure. Mild to moderate mitral valve regurgitation. Tricuspid  Valve: The tricuspid valve is not well visualized. Tricuspid valve regurgitation is trivial. Aortic Valve: The aortic valve is tricuspid. There is mild calcification of the aortic valve. There is moderate thickening of the aortic valve. Aortic valve regurgitation is not visualized. Mild to moderate aortic valve sclerosis/calcification is present, without any evidence of aortic stenosis. Pulmonic Valve: The pulmonic valve was not well visualized. Pulmonic valve regurgitation is not visualized. No evidence of pulmonic stenosis. Aorta: The aortic root and ascending aorta are structurally normal, with no evidence of dilitation. Pulmonary Artery: The pulmonary artery is not well seen. Venous: The inferior vena cava is normal in size with less than 50% respiratory variability, suggesting right atrial pressure of 8 mmHg. IAS/Shunts: The interatrial septum was not well visualized.  LEFT VENTRICLE PLAX 2D LVIDd:         5.54 cm   Diastology LVIDs:         4.67 cm   LV e' medial:    3.70 cm/s LV PW:         0.88 cm   LV E/e' medial:  33.8 LV IVS:        0.81 cm   LV e' lateral:   8.05 cm/s LVOT diam:     2.10 cm   LV E/e' lateral: 15.5 LV SV:         47 LV SV Index:   23 LVOT Area:     3.46 cm  RIGHT VENTRICLE            IVC  RV Basal diam:  3.71 cm    IVC diam: 1.90 cm RV S prime:     9.36 cm/s TAPSE (M-mode): 1.2 cm LEFT ATRIUM             Index        RIGHT ATRIUM           Index LA diam:        4.50 cm 2.21 cm/m   RA Area:     16.80 cm LA Vol (A2C):   60.0 ml 29.52 ml/m  RA Volume:   48.50 ml  23.86 ml/m LA Vol (A4C):   67.4 ml 33.16 ml/m LA Biplane Vol: 63.9 ml 31.44 ml/m  AORTIC VALVE LVOT Vmax:   75.80 cm/s LVOT Vmean:  52.200 cm/s LVOT VTI:    0.137 m  AORTA Ao Root diam: 3.30 cm Ao Asc diam:  3.40 cm MITRAL VALVE MV Area (PHT): 3.85 cm     SHUNTS MV Decel Time: 197 msec     Systemic VTI:  0.14 m MV E velocity: 125.00 cm/s  Systemic Diam: 2.10 cm MV A velocity: 68.90 cm/s MV E/A ratio:  1.81 Nelva Bush MD  Electronically signed by Nelva Bush MD Signature Date/Time: 04/15/2021/7:49:09 AM    Final     Cardiac Studies   Echo 04/14/21  1. Left ventricular ejection fraction, by estimation, is 25 to 30%. The  left ventricle has severely decreased function. The left ventricle  demonstrates regional wall motion abnormalities (see scoring  diagram/findings for description). The left  ventricular internal cavity size was borderline dilated. Left ventricular  diastolic parameters are consistent with Grade II diastolic dysfunction  (pseudonormalization). Elevated left atrial pressure. There is global  hypokinesis with akinesis of the  inferior and inferolateral walls.   2. Right ventricular systolic function is moderately reduced. The right  ventricular size is normal. Tricuspid regurgitation signal is inadequate  for assessing PA pressure.   3. Left atrial size was mildly dilated.   4. The mitral valve is normal in structure. Mild to moderate mitral valve  regurgitation; degree of regurgitation may be underestimated by jet  eccentricity.   5. The aortic valve is tricuspid. There is mild calcification of the  aortic valve. There is moderate thickening of the aortic valve. Aortic  valve regurgitation is not visualized. Mild to moderate aortic valve  sclerosis/calcification is present, without  any evidence of aortic stenosis.   6. The inferior vena cava is normal in size with <50% respiratory  variability, suggesting right atrial pressure of 8 mmHg.   Patient Profile     70 y.o. male  with hx of coronary artery disease s/p CABG in 2005 (LIMA to LAD, SVG to diagonal, OM, RCA with only LIMA patent), recent 04/01/2021 catheterization as below, known left bundle branch block, HFrEF with EF of 25% on previous [facility] echo, peripheral vascular disease, DM2, hypertension, hyperlipidemia, COPD, end-stage renal disease on hemodialysis, and who is being seen today for the evaluation of elevated  high-sensitivity troponin and volume overload.    Assessment & Plan    Elevated high-sensitivity troponin Known multivessel coronary artery disease s/p CABG --No chest pain.  Presented short of breath and volume overloaded with initial labs showing hyperkalemia.  Breathing improved with hemodialysis.  Echo confirms previous [facility] EF of 25%.  HS Tn peaked at [place].  EKG showed NSR with known left bundle branch block. He has known history of CAD s/p 04/01/2021 catheterization by [facility] with close outpatient follow-up for  possible future intervention of his subtotal left circumflex occlusion and aggressive risk factor modification.  High-sensitivity troponin elevation thought likely due to significant demand ischemia in the setting of his known severe multivessel CAD with recommendation for IV heparin, started yesterday. Continue IV heparin for 48h total Through 10/20 ~ 6:30PM.   Continue PTA medical management  ASA 81 mg daily  Plavix 75 mg daily  Carvedilol 25 mg twice daily Restarted PTA Vasotec 20 mg twice daily Atorvastatin 80 mg daily.   No plan for emergent catheterization this admission and given recent LHC by [facility] as described in consult note.  Recommend close outpatient follow-up with [facility].  No indication for transfer to [facility] at this time.  He has outpatient follow-up already scheduled with [facility] this Friday.     Acute on chronic systolic heart failure, ICM --Reports waking with acute shortness of breath.  Symptoms have significantly improved with hemodialysis.  He remains slightly volume up on exam but overall volume status has improved from yesterday.  Repeat echo confirms previous EF of 25% as previously reported by 12/2020 by [facility].  [facility] already performed a cath for reduced EF earlier this month and is following closely in the outpatient setting.   Continue volume management per nephrology.   Continue to monitor I/os, daily weights.   Continue GDMT including PTA Vasotec 20 mg twice daily,  carvedilol 25 mg twice daily, +/- PTA torsemide 100 mg every Monday, Wednesday, Friday, and Saturday or as per nephrology recommendations.    ESRD on HD --Continue hemodialysis as recommended by nephrology.     HLD --Continue statin.   PAD --Recent angiography with possible future intervention planned by [facility].  Continue PTA statin, ASA.  For questions or updates, please contact [place] Please consult [email or web address] for contact info under        Signed, Arvil Chaco, PA-C  04/15/2021, 9:59 AM

## 2021-04-15 NOTE — Progress Notes (Signed)
Bryce Garcia for heparin infusion initiation & monitoring1 Indication: chest pain/ACS   Patient Measurements: Height: '5\' 8"'$  (172.7 cm) Weight: 89.5 kg (197 lb 6.4 oz) IBW/kg (Calculated) : 68.4 Heparin Dosing Weight: 85.8 kg  Vital Signs: BP: 149/66 (10/19 0700) Pulse Rate: 62 (10/19 0700)  Labs: Recent Labs    04/14/21 1110 04/14/21 1311 04/14/21 1616 04/14/21 1824 04/15/21 0807  HGB 11.6*  --   --   --   --   HCT 35.6*  --   --   --   --   PLT 222  --   --   --   --   LABPROT  --   --   --   --  14.4  INR  --   --   --   --  1.1  HEPARINUNFRC  --   --   --   --  0.74*  CREATININE 6.78*  --   --   --  5.19*  TROPONINIHS 61* 1,772* 7,235* 11,648*  --      Estimated Creatinine Clearance: 14.8 mL/min (Bryce) (by C-G formula based on SCr of 5.19 mg/dL (H)).   Medical History: Past Medical History:  Diagnosis Date   CAD (coronary artery disease)    CHF (congestive heart failure) (HCC)    CKD (chronic kidney disease), stage III (HCC)    COPD (chronic obstructive pulmonary disease) ([place])    COVID-19    Diabetes mellitus without complication (HCC)    HFrEF (heart failure with reduced ejection fraction) (HCC)    LBBB (left bundle branch block)    Venous ulcer ([place])     [name] with Bryce hx of CAD s/p 4-vessel CABG with LIMA-LAD, SVG-diagonal, SVG to OM, SVG to RCA with non-STEMI in 07/2015 status post PCI/DES. Pharmacy asked to initiate and monitor Heparin for ACS.  No anticoagulants except  clopidogrel PTA per current med list.   Date/time HL Rate 10/19'@0807'$  0.74 1400u/hr, Supratherapeutic   Goal of Therapy:  Heparin level 0.3-0.7 units/ml Monitor platelets by anticoagulation protocol: Yes   Plan:  Supratherapeutic Decrease heparin infusion to 1300 units/hr  Check anti-Xa level in 8 hours and daily while on heparin Continue to monitor H&H and platelets  Bryce Garcia Bryce Garcia 04/15/2021,8:50 AM

## 2021-04-15 NOTE — Progress Notes (Signed)
[facility] [place] NAME: Bryce Garcia    MR#:  [id number]  DATE OF BIRTH:  31-Dec-1950  SUBJECTIVE:   came in with increasing shortness of breath. Had eaten four years of corn. Potassium was elevated. Underwent urgent dialysis with thousand cc ultrafiltration. Patient denies chest pain shortness of breath is on room air. Currently on IV heparin drip REVIEW OF SYSTEMS:   Review of Systems  Constitutional:  Negative for chills, fever and weight loss.  HENT:  Negative for ear discharge, ear pain and nosebleeds.   Eyes:  Negative for blurred vision, pain and discharge.  Respiratory:  Negative for sputum production, shortness of breath, wheezing and stridor.   Cardiovascular:  Negative for chest pain, palpitations, orthopnea and PND.  Gastrointestinal:  Negative for abdominal pain, diarrhea, nausea and vomiting.  Genitourinary:  Negative for frequency and urgency.  Musculoskeletal:  Negative for back pain and joint pain.  Neurological:  Positive for weakness. Negative for sensory change, speech change and focal weakness.  Psychiatric/Behavioral:  Negative for depression and hallucinations. The patient is not nervous/anxious.   Tolerating Diet: Tolerating PT:   DRUG ALLERGIES:   Allergies  Allergen Reactions   Other Other (See Comments)    Hypoglycemia unawareness Hypoglycemia unawareness    Amlodipine Swelling    VITALS:  Blood pressure 140/89, pulse 63, temperature (!) 96.7 F (35.9 C), temperature source Axillary, resp. rate 16, height '5\' 8"'$  (1.727 m), weight 89.5 kg, SpO2 100 %.  PHYSICAL EXAMINATION:   Physical Exam  GENERAL:  70 y.o.-year-old patient lying in the bed with no acute distress.  LUNGS: decreased breath sounds bilaterally, no wheezing, rales, rhonchi. No use of accessory muscles of respiration. HD access+ right subclavian CARDIOVASCULAR: S1, S2 normal. No murmurs, rubs, or gallops.  ABDOMEN: Soft, nontender,  nondistended. Bowel sounds present. No organomegaly or mass.  EXTREMITIES: No cyanosis, clubbing or edema b/l.    NEUROLOGIC: Cranial nerves II through XII are intact. No focal Motor or sensory deficits b/l.   PSYCHIATRIC:  patient is alert and oriented x 3.  SKIN: No obvious rash, lesion, or ulcer.   LABORATORY PANEL:  CBC Recent Labs  Lab 04/15/21 0807  WBC 8.4  HGB 11.2*  HCT 34.2*  PLT 184    Chemistries  Recent Labs  Lab 04/15/21 0807  NA 136  K 4.3  CL 96*  CO2 27  GLUCOSE 197*  BUN 35*  CREATININE 5.19*  CALCIUM 8.3*  AST 51*  ALT 18  ALKPHOS 100  BILITOT 1.1   Cardiac Enzymes No results for input(s): TROPONINI in the last 168 hours. RADIOLOGY:  DG Chest 1 View  Result Date: 04/14/2021 CLINICAL DATA:  SHOB EXAM: CHEST  1 VIEW COMPARISON:  03/04/2021. FINDINGS: Coarse bilateral interstitial and patchy airspace opacities, which appear slightly progressed in the right upper lung. No visible pleural effusions or pneumothorax. Enlarged cardiac silhouette, similar. Median sternotomy. Partially imaged degenerative change of the right shoulder. IMPRESSION: Coarse bilateral interstitial and patchy airspace opacities, which appear slightly progressed in the right upper lung. Primary differential considerations include asymmetric pulmonary edema or pneumonia, likely superimposed on chronic change. CT of the chest could further characterize if clinically indicated. Electronically Signed   By: Margaretha Sheffield M.D.   On: 04/14/2021 11:54   ECHOCARDIOGRAM COMPLETE  Result Date: 04/15/2021    ECHOCARDIOGRAM REPORT   Patient Name:   Bryce Garcia Date of Exam: 04/14/2021 Medical Rec #:  [id number]  Height:       68.0 in Accession #:    [id number]     Weight:       197.4 lb Date of Birth:  [birth year]      BSA:          2.032 m Patient Age:    47 years       BP:           146/74 mmHg Patient Gender: M              HR:           68 bpm. Exam Location:  [facility] Procedure: 2D Echo,  Cardiac Doppler and Color Doppler Indications:     [id number] Acute Systolic CHF  History:         Patient has prior history of Echocardiogram examinations, most                  recent 06/21/2019. CHF, CAD, COPD, Arrythmias:LBBB; Risk                  Factors:Diabetes. History of COVID-19.  Sonographer:     Cresenciano Lick RDCS Referring Phys:  [id number] Arvil Chaco Diagnosing Phys: Nelva Bush MD IMPRESSIONS  1. Left ventricular ejection fraction, by estimation, is 25 to 30%. The left ventricle has severely decreased function. The left ventricle demonstrates regional wall motion abnormalities (see scoring diagram/findings for description). The left ventricular internal cavity size was borderline dilated. Left ventricular diastolic parameters are consistent with Grade II diastolic dysfunction (pseudonormalization). Elevated left atrial pressure. There is global hypokinesis with akinesis of the inferior and inferolateral walls.  2. Right ventricular systolic function is moderately reduced. The right ventricular size is normal. Tricuspid regurgitation signal is inadequate for assessing PA pressure.  3. Left atrial size was mildly dilated.  4. The mitral valve is normal in structure. Mild to moderate mitral valve regurgitation; degree of regurgitation may be underestimated by jet eccentricity.  5. The aortic valve is tricuspid. There is mild calcification of the aortic valve. There is moderate thickening of the aortic valve. Aortic valve regurgitation is not visualized. Mild to moderate aortic valve sclerosis/calcification is present, without any evidence of aortic stenosis.  6. The inferior vena cava is normal in size with <50% respiratory variability, suggesting right atrial pressure of 8 mmHg. FINDINGS  Left Ventricle: Left ventricular ejection fraction, by estimation, is 25 to 30%. The left ventricle has severely decreased function. The left ventricle demonstrates regional wall motion abnormalities.  The left ventricular internal cavity size was borderline dilated. There is no left ventricular hypertrophy. Left ventricular diastolic parameters are consistent with Grade II diastolic dysfunction (pseudonormalization). Elevated left atrial pressure.  LV Wall Scoring: There is global hypokinesis with akinesis of the inferior and inferolateral walls. Right Ventricle: The right ventricular size is normal. No increase in right ventricular wall thickness. Right ventricular systolic function is moderately reduced. Tricuspid regurgitation signal is inadequate for assessing PA pressure. Left Atrium: Left atrial size was mildly dilated. Right Atrium: Right atrial size was normal in size. Pericardium: There is no evidence of pericardial effusion. Mitral Valve: The mitral valve is normal in structure. Mild to moderate mitral valve regurgitation. Tricuspid Valve: The tricuspid valve is not well visualized. Tricuspid valve regurgitation is trivial. Aortic Valve: The aortic valve is tricuspid. There is mild calcification of the aortic valve. There is moderate thickening of the aortic valve. Aortic valve regurgitation is not visualized. Mild to moderate aortic valve sclerosis/calcification is present,  without any evidence of aortic stenosis. Pulmonic Valve: The pulmonic valve was not well visualized. Pulmonic valve regurgitation is not visualized. No evidence of pulmonic stenosis. Aorta: The aortic root and ascending aorta are structurally normal, with no evidence of dilitation. Pulmonary Artery: The pulmonary artery is not well seen. Venous: The inferior vena cava is normal in size with less than 50% respiratory variability, suggesting right atrial pressure of 8 mmHg. IAS/Shunts: The interatrial septum was not well visualized.  LEFT VENTRICLE PLAX 2D LVIDd:         5.54 cm   Diastology LVIDs:         4.67 cm   LV e' medial:    3.70 cm/s LV PW:         0.88 cm   LV E/e' medial:  33.8 LV IVS:        0.81 cm   LV e' lateral:   8.05  cm/s LVOT diam:     2.10 cm   LV E/e' lateral: 15.5 LV SV:         47 LV SV Index:   23 LVOT Area:     3.46 cm  RIGHT VENTRICLE            IVC RV Basal diam:  3.71 cm    IVC diam: 1.90 cm RV S prime:     9.36 cm/s TAPSE (M-mode): 1.2 cm LEFT ATRIUM             Index        RIGHT ATRIUM           Index LA diam:        4.50 cm 2.21 cm/m   RA Area:     16.80 cm LA Vol (A2C):   60.0 ml 29.52 ml/m  RA Volume:   48.50 ml  23.86 ml/m LA Vol (A4C):   67.4 ml 33.16 ml/m LA Biplane Vol: 63.9 ml 31.44 ml/m  AORTIC VALVE LVOT Vmax:   75.80 cm/s LVOT Vmean:  52.200 cm/s LVOT VTI:    0.137 m  AORTA Ao Root diam: 3.30 cm Ao Asc diam:  3.40 cm MITRAL VALVE MV Area (PHT): 3.85 cm     SHUNTS MV Decel Time: 197 msec     Systemic VTI:  0.14 m MV E velocity: 125.00 cm/s  Systemic Diam: 2.10 cm MV A velocity: 68.90 cm/s MV E/A ratio:  1.81 Harrell Gave End MD Electronically signed by Nelva Bush MD Signature Date/Time: 04/15/2021/7:49:09 AM    Final    ASSESSMENT AND PLAN:   Bryce Garcia is a 70 y.o. male with past medical history of coronary artery disease, CABG, CHF, CKD, end-stage renal disease on hemodialysis, history of diabetes, presented to hospital with generalized weakness and increasing shortness of breath.  Patient stated that he did have shortness of breath and cough with productive sputum production over the last 24 hours which was worsening in onset.  Acute respiratory distress with acute hypoxic respiratory failure/Pulmonary edema  likely secondary to volume overload and acute on chronic congestive heart failure with possible mild COPD exacerbation..   --Patient did have productive sputum but no fever chills or other signs of infection. --  Continue volume management with dialysis.  -- Resume torsemide from home.  --Will hold off with antibiotics for now.   --Continue bronchodilators.   End-stage renal disease Hyperkalemia  on hemodialysis Tuesday Thursday Saturday.  Patient was given orders and  hemodialysis in the ED due to hyperkalemia and volume overload. --  k down to 4.3    acute on chronic diastolic heart failure.  Follow CHF protocol history contact with charting Daily weights.  Volume management with dialysis and torsemide. --appears at baseline after UF to 1000 cc with HD   Significantly elevated troponin without chest pain.  EKG without acute ischemic changes.  In the context of acute respiratory distress, spoke with cardiology Dr. Saunders Revel regarding consultation.   --EF 25-30% -cardiology recs IV heparin gtt protocol for 48 hours. Pt is CP free    Anemia of chronic kidney disease.  Hemoglobin at goal.  Nephrology following.   Diabetes mellitus type 2 with chronic kidney disease and hyperglycemia on presentation..  On NPH and regular insulin at home.  We will put the patient on long-acting insulin sliding scale and mealtime insulin while in the hospital.  Closely monitor blood glucose levels.   DVT Prophylaxis: Heparin subcu    Procedures:none Family communication :none today Consults :nephrology ,cards CODE STATUS: full DVT Prophylaxis :heparin Level of care: Progressive Cardiac Status is: Inpatient  patient remains on IV heparin drip. If continues to show improvement will discharge likely tomorrow after dialysis once cleared by cardiology        TOTAL TIME TAKING CARE OF THIS PATIENT: 30 minutes.  >50% time spent on counselling and coordination of care  Note: This dictation was prepared with Dragon dictation along with smaller phrase technology. Any transcriptional errors that result from this process are unintentional.  Fritzi Mandes M.D    [facility]   CC: Primary care physician; [place] Patient ID: Bryce Garcia, male   DOB: 1951/06/08, 70 y.o.   MRN: [id number]

## 2021-04-15 NOTE — TOC Initial Note (Signed)
Transition of Care [facility]) - Initial/Assessment Note    Patient Details  Name: Bryce Garcia MRN: [id number] Date of Birth: Nov 13, 1950  [facility] CM/SW Contact:    Adelene Amas, [place] Phone Number: 04/15/2021, 4:18 PM  Clinical Narrative:                  Patient presents to [facility] due to SOB.  CSW spoke with patient who stated he is independent at home with all ADLs.  Patient's son lives with him. Patient stated he is not interested in receiving home health services or SNF facility placement regardless of recommendation. Main contact [place], Hermanns (Son) [phone] [facility]).  TOC will continue to follow.  Expected Discharge Plan: Home/Self Care Barriers to Discharge: Continued Medical Work up   Patient Goals and CMS Choice Patient states their goals for this hospitalization and ongoing recovery are:: To return home as soon as possible      Expected Discharge Plan and Services Expected Discharge Plan: Home/Self Care In-house Referral: Clinical Social Work     Living arrangements for the past 2 months: Single Family Home                                      Prior Living Arrangements/Services Living arrangements for the past 2 months: Single Family Home Lives with:: Adult Children Barraco, Alverda Skeans (Son)   [phone] (Mobile)) Patient language and need for interpreter reviewed:: Yes Do you feel safe going back to the place where you live?: Yes      Need for Family Participation in Patient Care: Yes (Comment) Care giver support system in place?: Yes (comment)   Criminal Activity/Legal Involvement Pertinent to Current Situation/Hospitalization: No - Comment as needed  Activities of Daily Living      Permission Sought/Granted Permission sought to share information with : Family Supports Permission granted to share information with : Yes, Verbal Permission Granted  Share Information with NAME: Zyron, Delisi (Son)   [phone] (Mobile)            Emotional Assessment Appearance:: Appears stated age Attitude/Demeanor/Rapport: Engaged Affect (typically observed): Stable Orientation: : Oriented to Self, Oriented to Place, Oriented to  Time, Oriented to Situation Alcohol / Substance Use: Not Applicable Psych Involvement: No (comment)  Admission diagnosis:  Volume overload [E87.70] Patient Active Problem List   Diagnosis Date Noted   Volume overload 04/14/2021   Hyperkalemia 03/04/2021   Type I diabetes mellitus with nephropathy ([place]) 03/04/2021   ESRD on hemodialysis (HCC)    Generalized weakness    Acute metabolic encephalopathy due to hypoglycemia 12/16/2020   Chronic diastolic CHF (congestive heart failure) ([place]) 12/15/2020   Type II diabetes mellitus with renal manifestations ([place]) [id number]   Acute metabolic encephalopathy [id number]   Hypoglycemia 12/15/2020   Acute on chronic respiratory failure with hypoxia ([place]) 12/15/2020   Sepsis ([place]) 12/15/2020   HLD (hyperlipidemia) 12/15/2020   HTN (hypertension) 12/15/2020   Hypothyroidism 12/15/2020   Depression 12/15/2020   CAD (coronary artery disease) 12/15/2020   ESRD (end stage renal disease) on dialysis ([place]) 12/15/2020   Hypothermia 12/15/2020   HCAP (healthcare-associated pneumonia) 12/15/2020   Lobar pneumonia ([place])    COPD with acute exacerbation ([place])    Blurred vision, bilateral    Anemia of chronic kidney failure, stage 4 (severe) ([place]) 07/22/2019   Recurrent pneumonia 07/22/2019   Type 2 DM with CKD stage 4 and hypertension (  [place]) 07/22/2019   Acute on chronic diastolic CHF (congestive heart failure) ([place]) 07/22/2019   Acute on chronic heart failure with preserved ejection fraction (HFpEF) (HCC)    Acute renal failure with acute renal cortical necrosis superimposed on stage 3 chronic kidney disease ([place])    Acute respiratory failure with hypoxia ([place]) 06/21/2019   Venous ulcer of left leg ([place]) 06/21/2019   COPD with chronic bronchitis ([place]) 06/21/2019    CKD (chronic kidney disease) stage 4, GFR 15-29 ml/min ([place]) 06/21/2019   AKI (acute kidney injury) ([place]) [id number]   Chronic systolic CHF (congestive heart failure) ([place]) 06/21/2019   History of 2019 novel coronavirus disease (COVID-19) 06/21/2019   Anemia in ESRD (end-stage renal disease) ([place]) 06/21/2019   Acute respiratory failure ([place]) 06/21/2019   Hyperglycemia due to type 1 diabetes mellitus ([place]) 06/21/2019   Hx of CABG 06/21/2019   NSTEMI (non-ST elevated myocardial infarction) ([place]) 06/21/2019   PCP:  [place], [place]:   [facility] [address] [place] [address] Phone: [phone] Fax: [phone]     Social Determinants of Health (SDOH) Interventions    Readmission Risk Interventions No flowsheet data found.

## 2021-04-15 NOTE — Progress Notes (Signed)
[place] for heparin infusion initiation & monitoring1 Indication: chest pain/ACS   Patient Measurements: Height: '5\' 8"'$  (172.7 cm) Weight: 89.5 kg (197 lb 6.4 oz) IBW/kg (Calculated) : 68.4 Heparin Dosing Weight: 85.8 kg  Vital Signs: BP: 140/89 (10/19 1545) Pulse Rate: 63 (10/19 1545)  Labs: Recent Labs    04/14/21 1110 04/14/21 1311 04/14/21 1616 04/14/21 1824 04/15/21 0807  HGB 11.6*  --   --   --  11.2*  HCT 35.6*  --   --   --  34.2*  PLT 222  --   --   --  184  LABPROT  --   --   --   --  14.4  INR  --   --   --   --  1.1  HEPARINUNFRC  --   --   --   --  0.74*  CREATININE 6.78*  --   --   --  5.19*  TROPONINIHS 61*   < > 7,235* 11,648* 11,461*   < > = values in this interval not displayed.     Estimated Creatinine Clearance: 14.8 mL/min (A) (by C-G formula based on SCr of 5.19 mg/dL (H)).   Medical History: Past Medical History:  Diagnosis Date   CAD (coronary artery disease)    CHF (congestive heart failure) (HCC)    CKD (chronic kidney disease), stage III (HCC)    COPD (chronic obstructive pulmonary disease) ([place])    COVID-19    Diabetes mellitus without complication (HCC)    HFrEF (heart failure with reduced ejection fraction) (HCC)    LBBB (left bundle branch block)    Venous ulcer ([place])     [name] with a hx of CAD s/p 4-vessel CABG with LIMA-LAD, SVG-diagonal, SVG to OM, SVG to RCA with non-STEMI in 07/2015 status post PCI/DES. Pharmacy asked to initiate and monitor Heparin for ACS.  No anticoagulants except  clopidogrel PTA per current med list. He is being followed by cardiology and the current plan is to continue heparin through 04/16/21 at 1830 per cardiology notes.  Goal of Therapy:  Heparin level 0.3-0.7 units/ml Monitor platelets by anticoagulation protocol: Yes   Plan:  Heparin level is therapeutic Continue heparin infusion at 1300 units/hr  Check anti-Xa level in 8 hours and daily  while on heparin Continue to monitor H&H and platelets  Bryce Garcia 04/15/2021,7:11 PM

## 2021-04-16 DIAGNOSIS — J9601 Acute respiratory failure with hypoxia: Secondary | ICD-10-CM

## 2021-04-16 LAB — CBC
HCT: 32.7 % — ABNORMAL LOW (ref 39.0–52.0)
Hemoglobin: 10.9 g/dL — ABNORMAL LOW (ref 13.0–17.0)
MCH: 34.5 pg — ABNORMAL HIGH (ref 26.0–34.0)
MCHC: 33.3 g/dL (ref 30.0–36.0)
MCV: 103.5 fL — ABNORMAL HIGH (ref 80.0–100.0)
Platelets: 191 10*3/uL (ref 150–400)
RBC: 3.16 MIL/uL — ABNORMAL LOW (ref 4.22–5.81)
RDW: 14.4 % (ref 11.5–15.5)
WBC: 7.1 10*3/uL (ref 4.0–10.5)
nRBC: 0 % (ref 0.0–0.2)

## 2021-04-16 LAB — HEMOGLOBIN A1C
Hgb A1c MFr Bld: 8.2 % — ABNORMAL HIGH (ref 4.8–5.6)
Mean Plasma Glucose: 189 mg/dL

## 2021-04-16 LAB — HEPATITIS B SURFACE ANTIBODY, QUANTITATIVE: Hep B S AB Quant (Post): <3.1 m[IU]/mL — ABNORMAL LOW

## 2021-04-16 LAB — CBG MONITORING, ED: Glucose-Capillary: 260 mg/dL — ABNORMAL HIGH (ref 70–99)

## 2021-04-16 LAB — HEPARIN LEVEL (UNFRACTIONATED): Heparin Unfractionated: 0.51 IU/mL (ref 0.30–0.70)

## 2021-04-16 MED ORDER — PATIROMER SORBITEX CALCIUM 8.4 G PO PACK: 8.4000 g | PACK | Freq: Every day | ORAL | 1 refills | Status: AC

## 2021-04-16 MED ORDER — PATIROMER SORBITEX CALCIUM 8.4 G PO PACK
8.4000 g | PACK | Freq: Every day | ORAL | Status: DC
  Filled 2021-04-16: qty 1

## 2021-04-16 MED ORDER — INSULIN NPH (HUMAN) (ISOPHANE) 100 UNIT/ML ~~LOC~~ SUSP: 10.0000 [IU] | Freq: Every day | SUBCUTANEOUS | 1 refills | Status: AC

## 2021-04-16 NOTE — ED Notes (Signed)
Pt resting comfortably in recliner next to bed. Denies any pain at this time. Denies SOB or any trouble breathing.

## 2021-04-16 NOTE — Discharge Instructions (Signed)
Resume your hemodialysis Tuesday Thursday Saturday on your routine schedule Keep log of your sugars at home adjust your insulin

## 2021-04-16 NOTE — ED Notes (Signed)
Pt taken for dialysis

## 2021-04-16 NOTE — Progress Notes (Signed)
Patient with LUA AVF cannulated with 16g needle, reduced BFR. Tolerated tx, 1 liter fld removal, VSS, afebrile, Increased bleeding post tx d/t heparin infusion. Pt with no concerns, returned to ED.

## 2021-04-16 NOTE — Progress Notes (Signed)
Spoke w/primary Geographical information systems officer.  At this time there is not a need for a 2nd PIV.  Will place consult if need arises.

## 2021-04-16 NOTE — ED Notes (Signed)
Pt unable to sign for discharge d/t no signature pad- pt knowledgeable of d/c instructions and has no further questions

## 2021-04-16 NOTE — Progress Notes (Signed)
[facility]  ROUNDING NOTE   Subjective:   Bryce Garcia is a 70 y.o. male with history of CAD, CHF, COPD, diabetes,  and end stage renal disease on dialysis. Patient present to ED with complaints of weakness and shortness of breath. He has been admitted for Volume overload [E87.70] Pulmonary edema cardiac cause [facility]) [I50.1]  Patient is known to our practice from previous admission and receives outpatient dialysis at [facility], supervised by [facility] physicians.   Patient seen and evaluated during dialysis   HEMODIALYSIS FLOWSHEET:  Blood Flow Rate (mL/min): 350 mL/min Arterial Pressure (mmHg): -200 mmHg Venous Pressure (mmHg): 230 mmHg Transmembrane Pressure (mmHg): 60 mmHg Ultrafiltration Rate (mL/min): 500 mL/min Dialysate Flow Rate (mL/min): 500 ml/min Conductivity: Machine : 13.8 Conductivity: Machine : 13.8 Dialysis Fluid Bolus: Normal Saline Bolus Amount (mL): 250 mL Dialysate Change: 2K  No other complaints at this time Potassium 4.3   Objective:  Vital signs in last 24 hours:  Temp:  [97.4 F (36.3 C)-98.4 F (36.9 C)] 98.4 F (36.9 C) (10/20 1016) Pulse Rate:  [55-76] 76 (10/20 1452) Resp:  [13-22] 18 (10/20 1452) BP: (139-171)/(57-89) 139/57 (10/20 1452) SpO2:  [95 %-100 %] 95 % (10/20 1452) Weight:  [84.6 kg] 84.6 kg (10/20 0954)  Weight change:  Filed Weights   04/14/21 1800 04/14/21 1814 04/16/21 0954  Weight: 89.5 kg 89.5 kg 84.6 kg    Intake/Output: I/O last 3 completed shifts: In: 399.2 [I.V.:399.2] Out: 150 [Urine:150]   Intake/Output this shift:  Total I/O In: 110.6 [I.V.:110.6] Out: 1102 [Urine:100; Other:1002]  Physical Exam: General: NAD  Head: Normocephalic, atraumatic. Moist oral mucosal membranes  Eyes: Anicteric  Lungs:  Clear to auscultation, normal effort  Heart: Regular rate and rhythm  Abdomen:  Soft, nontender, nondistended  Extremities: no peripheral edema.  Neurologic: Nonfocal, moving all four  extremities  Skin: No lesions  Access: Left aVF    Basic Metabolic Panel: Recent Labs  Lab 04/14/21 1110 04/14/21 1520 04/14/21 1616 04/14/21 1824 04/15/21 0807 04/15/21 1913  NA 132*  --   --   --  136  --   K 7.5* 4.8 4.2 4.0 4.3  --   CL 93*  --   --   --  96*  --   CO2 26  --   --   --  27  --   GLUCOSE 521*  --   --   --  197*  --   BUN 54*  --   --   --  35*  --   CREATININE 6.78*  --   --   --  5.19*  --   CALCIUM 8.2*  --   --   --  8.3*  --   PHOS  --   --   --   --   --  3.7     Liver Function Tests: Recent Labs  Lab 04/14/21 1110 04/15/21 0807  AST 28 51*  ALT 16 18  ALKPHOS 130* 100  BILITOT 1.4* 1.1  PROT 7.2 6.4*  ALBUMIN 3.9 3.5    No results for input(s): LIPASE, AMYLASE in the last 168 hours. No results for input(s): AMMONIA in the last 168 hours.  CBC: Recent Labs  Lab 04/14/21 1110 04/15/21 0807 04/16/21 0741  WBC 10.7* 8.4 7.1  NEUTROABS 8.6*  --   --   HGB 11.6* 11.2* 10.9*  HCT 35.6* 34.2* 32.7*  MCV 106.6* 102.7* 103.5*  PLT 222 184 191  Cardiac Enzymes: No results for input(s): CKTOTAL, CKMB, CKMBINDEX, TROPONINI in the last 168 hours.  BNP: Invalid input(s): POCBNP  CBG: Recent Labs  Lab 04/14/21 2108 04/15/21 1305 04/15/21 1648 04/15/21 2159 04/16/21 0736  GLUCAP 226* 296* 326* 356* 260*     Microbiology: Results for orders placed or performed during the hospital encounter of 04/14/21  Resp Panel by RT-PCR (Flu A&B, Covid) Nasopharyngeal Swab     Status: None   Collection Time: 04/14/21 11:51 AM   Specimen: Nasopharyngeal Swab; Nasopharyngeal(NP) swabs in vial transport medium  Result Value Ref Range Status   SARS Coronavirus 2 by RT PCR NEGATIVE NEGATIVE Final    Comment: (NOTE) SARS-CoV-2 target nucleic acids are NOT DETECTED.  The SARS-CoV-2 RNA is generally detectable in upper respiratory specimens during the acute phase of infection. The lowest concentration of SARS-CoV-2 viral copies this assay  can detect is 138 copies/mL. A negative result does not preclude SARS-Cov-2 infection and should not be used as the sole basis for treatment or other patient management decisions. A negative result may occur with  improper specimen collection/handling, submission of specimen other than nasopharyngeal swab, presence of viral mutation(s) within the areas targeted by this assay, and inadequate number of viral copies(<138 copies/mL). A negative result must be combined with clinical observations, patient history, and epidemiological information. The expected result is Negative.  Fact Sheet for Patients:  EntrepreneurPulse.com.au  Fact Sheet for Healthcare Providers:  IncredibleEmployment.be  This test is no t yet approved or cleared by the [place] FDA and  has been authorized for detection and/or diagnosis of SARS-CoV-2 by FDA under an Emergency Use Authorization (EUA). This EUA will remain  in effect (meaning this test can be used) for the duration of the COVID-19 declaration under Section 564(b)(1) of the Act, 21 U.S.C.section 360bbb-3(b)(1), unless the authorization is terminated  or revoked sooner.       Influenza A by PCR NEGATIVE NEGATIVE Final   Influenza B by PCR NEGATIVE NEGATIVE Final    Comment: (NOTE) The Xpert Xpress SARS-CoV-2/FLU/RSV plus assay is intended as an aid in the diagnosis of influenza from Nasopharyngeal swab specimens and should not be used as a sole basis for treatment. Nasal washings and aspirates are unacceptable for Xpert Xpress SARS-CoV-2/FLU/RSV testing.  Fact Sheet for Patients: EntrepreneurPulse.com.au  Fact Sheet for Healthcare Providers: IncredibleEmployment.be  This test is not yet approved or cleared by the [place] FDA and has been authorized for detection and/or diagnosis of SARS-CoV-2 by FDA under an Emergency Use Authorization (EUA). This EUA will  remain in effect (meaning this test can be used) for the duration of the COVID-19 declaration under Section 564(b)(1) of the Act, 21 U.S.C. section 360bbb-3(b)(1), unless the authorization is terminated or revoked.  Performed at [facility], [address] [place], [place] [address]   Blood culture (routine x 2)     Status: None (Preliminary result)   Collection Time: 04/14/21 12:20 PM   Specimen: BLOOD  Result Value Ref Range Status   Specimen Description BLOOD RIGHT ANTECUBITAL  Final   Special Requests   Final    BOTTLES DRAWN AEROBIC AND ANAEROBIC Blood Culture adequate volume   Culture   Final    NO GROWTH 2 DAYS Performed at [facility], [address], [place], [place] [address]    Report Status PENDING  Incomplete  Blood culture (routine x 2)     Status: None (Preliminary result)   Collection Time: 04/14/21 12:51 PM  Specimen: BLOOD  Result Value Ref Range Status   Specimen Description BLOOD BLOOD RIGHT WRIST  Final   Special Requests   Final    BOTTLES DRAWN AEROBIC AND ANAEROBIC Blood Culture adequate volume   Culture   Final    NO GROWTH 2 DAYS Performed at [facility], [address] [place], [place] [address]    Report Status PENDING  Incomplete    Coagulation Studies: Recent Labs    04/15/21 0807  LABPROT 14.4  INR 1.1     Urinalysis: No results for input(s): COLORURINE, LABSPEC, PHURINE, GLUCOSEU, HGBUR, BILIRUBINUR, KETONESUR, PROTEINUR, UROBILINOGEN, NITRITE, LEUKOCYTESUR in the last 72 hours.  Invalid input(s): APPERANCEUR    Imaging: ECHOCARDIOGRAM COMPLETE  Result Date: 04/15/2021    ECHOCARDIOGRAM REPORT   Patient Name:   Bryce Garcia Date of Exam: 04/14/2021 Medical Rec #:  [id number]      Height:       68.0 in Accession #:    [id number]     Weight:       197.4 lb Date of Birth:  July 23, 1950      BSA:          2.032 m Patient Age:    29 years       BP:           146/74 mmHg Patient Gender: M               HR:           68 bpm. Exam Location:  [facility] Procedure: 2D Echo, Cardiac Doppler and Color Doppler Indications:     [id number] Acute Systolic CHF  History:         Patient has prior history of Echocardiogram examinations, most                  recent 06/21/2019. CHF, CAD, COPD, Arrythmias:LBBB; Risk                  Factors:Diabetes. History of COVID-19.  Sonographer:     Cresenciano Lick RDCS Referring Phys:  [id number] Arvil Chaco Diagnosing Phys: Nelva Bush MD IMPRESSIONS  1. Left ventricular ejection fraction, by estimation, is 25 to 30%. The left ventricle has severely decreased function. The left ventricle demonstrates regional wall motion abnormalities (see scoring diagram/findings for description). The left ventricular internal cavity size was borderline dilated. Left ventricular diastolic parameters are consistent with Grade II diastolic dysfunction (pseudonormalization). Elevated left atrial pressure. There is global hypokinesis with akinesis of the inferior and inferolateral walls.  2. Right ventricular systolic function is moderately reduced. The right ventricular size is normal. Tricuspid regurgitation signal is inadequate for assessing PA pressure.  3. Left atrial size was mildly dilated.  4. The mitral valve is normal in structure. Mild to moderate mitral valve regurgitation; degree of regurgitation may be underestimated by jet eccentricity.  5. The aortic valve is tricuspid. There is mild calcification of the aortic valve. There is moderate thickening of the aortic valve. Aortic valve regurgitation is not visualized. Mild to moderate aortic valve sclerosis/calcification is present, without any evidence of aortic stenosis.  6. The inferior vena cava is normal in size with <50% respiratory variability, suggesting right atrial pressure of 8 mmHg. FINDINGS  Left Ventricle: Left ventricular ejection fraction, by estimation, is 25 to 30%. The left ventricle has severely decreased function. The  left ventricle demonstrates regional wall motion abnormalities. The left ventricular internal cavity size was borderline dilated. There is no left ventricular  hypertrophy. Left ventricular diastolic parameters are consistent with Grade II diastolic dysfunction (pseudonormalization). Elevated left atrial pressure.  LV Wall Scoring: There is global hypokinesis with akinesis of the inferior and inferolateral walls. Right Ventricle: The right ventricular size is normal. No increase in right ventricular wall thickness. Right ventricular systolic function is moderately reduced. Tricuspid regurgitation signal is inadequate for assessing PA pressure. Left Atrium: Left atrial size was mildly dilated. Right Atrium: Right atrial size was normal in size. Pericardium: There is no evidence of pericardial effusion. Mitral Valve: The mitral valve is normal in structure. Mild to moderate mitral valve regurgitation. Tricuspid Valve: The tricuspid valve is not well visualized. Tricuspid valve regurgitation is trivial. Aortic Valve: The aortic valve is tricuspid. There is mild calcification of the aortic valve. There is moderate thickening of the aortic valve. Aortic valve regurgitation is not visualized. Mild to moderate aortic valve sclerosis/calcification is present, without any evidence of aortic stenosis. Pulmonic Valve: The pulmonic valve was not well visualized. Pulmonic valve regurgitation is not visualized. No evidence of pulmonic stenosis. Aorta: The aortic root and ascending aorta are structurally normal, with no evidence of dilitation. Pulmonary Artery: The pulmonary artery is not well seen. Venous: The inferior vena cava is normal in size with less than 50% respiratory variability, suggesting right atrial pressure of 8 mmHg. IAS/Shunts: The interatrial septum was not well visualized.  LEFT VENTRICLE PLAX 2D LVIDd:         5.54 cm   Diastology LVIDs:         4.67 cm   LV e' medial:    3.70 cm/s LV PW:         0.88 cm   LV  E/e' medial:  33.8 LV IVS:        0.81 cm   LV e' lateral:   8.05 cm/s LVOT diam:     2.10 cm   LV E/e' lateral: 15.5 LV SV:         47 LV SV Index:   23 LVOT Area:     3.46 cm  RIGHT VENTRICLE            IVC RV Basal diam:  3.71 cm    IVC diam: 1.90 cm RV S prime:     9.36 cm/s TAPSE (M-mode): 1.2 cm LEFT ATRIUM             Index        RIGHT ATRIUM           Index LA diam:        4.50 cm 2.21 cm/m   RA Area:     16.80 cm LA Vol (A2C):   60.0 ml 29.52 ml/m  RA Volume:   48.50 ml  23.86 ml/m LA Vol (A4C):   67.4 ml 33.16 ml/m LA Biplane Vol: 63.9 ml 31.44 ml/m  AORTIC VALVE LVOT Vmax:   75.80 cm/s LVOT Vmean:  52.200 cm/s LVOT VTI:    0.137 m  AORTA Ao Root diam: 3.30 cm Ao Asc diam:  3.40 cm MITRAL VALVE MV Area (PHT): 3.85 cm     SHUNTS MV Decel Time: 197 msec     Systemic VTI:  0.14 m MV E velocity: 125.00 cm/s  Systemic Diam: 2.10 cm MV A velocity: 68.90 cm/s MV E/A ratio:  1.81 Harrell Gave End MD Electronically signed by Nelva Bush MD Signature Date/Time: 04/15/2021/7:49:09 AM    Final      Medications:    sodium chloride  sodium chloride     sodium chloride     heparin Stopped (04/16/21 1454)    aspirin EC  81 mg Oral Daily   atorvastatin  80 mg Oral Daily   carvedilol  25 mg Oral BID   Chlorhexidine Gluconate Cloth  6 each Topical Q0600   cinacalcet  30 mg Oral Daily   clopidogrel  75 mg Oral Daily   DULoxetine  30 mg Oral Daily   insulin aspart  0-5 Units Subcutaneous QHS   insulin aspart  0-9 Units Subcutaneous TID WC   insulin aspart  2 Units Subcutaneous TID WC   insulin glargine-yfgn  10 Units Subcutaneous QHS   ipratropium  0.5 mg Nebulization Q6H   levothyroxine  100 mcg Oral Daily   multivitamin  1 tablet Oral QHS   patiromer  8.4 g Oral Daily   pentoxifylline  400 mg Oral TID   sevelamer carbonate  2,400 mg Oral TID with meals   sodium chloride flush  3 mL Intravenous Q12H   torsemide  100 mg Oral Once per day on Mon Wed Fri Sat   sodium chloride, sodium  chloride, sodium chloride, albuterol, alteplase, heparin, HYDROcodone-acetaminophen, lidocaine (PF), lidocaine-prilocaine, ondansetron **OR** ondansetron (ZOFRAN) IV, pentafluoroprop-tetrafluoroeth, sodium chloride flush  Assessment/ Plan:  Bryce Garcia is a 70 y.o.  male with history of CAD, CHF, COPD, diabetes,  and end stage renal disease on dialysis. Patient present to ED with complaints of weakness and shortness of breath. He has been admitted for Volume overload [E87.70] Pulmonary edema cardiac cause ([place]) [I50.1]   [facility] [address]/TTS/Lt AVF  End-stage renal disease with hyperkalemia on hemodialysis.  Will maintain outpatient schedule during this admission, if possible.  Received urgent dialysis in ED for potassium 7.5. Received dialysis today as per outpatient schedule. Tolerated well. Would recommend prescribing Vetassa 8.'6mg'$  daily at discharge. Dialysis coordinator provided brief dietary guidance with handouts.   2. Anemia of chronic kidney disease  Lab Results  Component Value Date   HGB 10.9 (L) 04/16/2021  Hgb at target  3. Secondary Hyperparathyroidism:  Lab Results  Component Value Date   CALCIUM 8.3 (L) 04/15/2021   PHOS 3.7 04/15/2021  Vitamin D and sevelamer ordered outpatient Sevelamer with meals  4.  Chronic diastolic heart failure Echo from December 2020 shows EF at 50  5. Diabetes mellitus type II with chronic kidney disease: insulin dependent. Home regimen includes NPH and regular insulin. Most recent hemoglobin A1c is 6.8 on 06/21/2019.  Glucose elevated    LOS: 2 Bryce Garcia 10/20/20223:08 PM

## 2021-04-16 NOTE — Discharge Summary (Addendum)
[facility] [place] NAME: Bryce Garcia    MR#:  [id number]  DATE OF BIRTH:  1951/05/17  DATE OF ADMISSION:  04/14/2021 ADMITTING PHYSICIAN: Fritzi Mandes, MD  DATE OF DISCHARGE: 04/16/2021  PRIMARY CARE PHYSICIAN: Hill, L[place] DIAGNOSIS:  Volume overload [E87.70] Pulmonary edema cardiac cause ([place]) [I50.1]  DISCHARGE DIAGNOSIS:  Pulmonary edema with acute hypoxic respiratory failure--improved Elevated troponin due to demand ischemia ESRD on HD SECONDARY DIAGNOSIS:   Past Medical History:  Diagnosis Date   CAD (coronary artery disease)    CHF (congestive heart failure) (HCC)    CKD (chronic kidney disease), stage III (HCC)    COPD (chronic obstructive pulmonary disease) ([place])    COVID-19    Diabetes mellitus without complication (HCC)    HFrEF (heart failure with reduced ejection fraction) (HCC)    LBBB (left bundle branch block)    Venous ulcer [facility])     HOSPITAL COURSE:   Bryce Garcia is a 70 y.o. male with past medical history of coronary artery disease, CABG, CHF, CKD, end-stage renal disease on hemodialysis, history of diabetes, presented to hospital with generalized weakness and increasing shortness of breath.  Patient stated that he did have shortness of breath and cough with productive sputum production over the last 24 hours which was worsening in onset.   Acute respiratory distress with acute hypoxic respiratory failure/Pulmonary edema likely secondary to volume overload and acute on chronic congestive heart failure with possible mild COPD exacerbation..   --Patient did have productive sputum but no fever chills or other signs of infection. -- Continue volume management with dialysis.  -- Resume torsemide from home.  --Will hold off with antibiotics for now.   --Continue bronchodilators.   End-stage renal disease Hyperkalemia --on hemodialysis Tuesday Thursday  Saturday.  Patient was given orders and hemodialysis in the ED due to hyperkalemia and volume overload. --k down to 4.3 --dr Holley Raring recommends veltassa 8.4 g daily    acute on chronic diastolic heart failure.  Follow CHF protocol history contact with charting Daily weights.  Volume management with dialysis and torsemide. --appears at baseline after UF to 1000 cc with HD --10/20--getting HD today. Feeling good   Significantly elevated troponin without chest pain.  EKG without acute ischemic changes.  In the context of acute respiratory distress, spoke with cardiology Dr. Saunders Revel regarding consultation.   --EF 25-30% -cardiology recs IV heparin gtt protocol for 48 hours. Pt is CP free    Anemia of chronic kidney disease.  Hemoglobin at goal.  Nephrology following.   Diabetes mellitus type 2 with chronic kidney disease and hyperglycemia on presentation..  On NPH and regular insulin at home.  We will put the patient on long-acting insulin sliding scale and mealtime insulin while in the hospital.  Closely monitor blood glucose levels.  Overall stable Will d/c home after HD today CONSULTS OBTAINED:  Treatment Team:  Nelva Bush, MD  DRUG ALLERGIES:   Allergies  Allergen Reactions   Other Other (See Comments)    Hypoglycemia unawareness Hypoglycemia unawareness    Amlodipine Swelling    DISCHARGE MEDICATIONS:   Allergies as of 04/16/2021       Reactions   Other Other (See Comments)   Hypoglycemia unawareness Hypoglycemia unawareness   Amlodipine Swelling        Medication List     STOP taking these medications    albuterol 108 (90 Base) MCG/ACT  inhaler Commonly known as: VENTOLIN HFA   fluocinonide cream 0.05 % Commonly known as: LIDEX   ipratropium 17 MCG/ACT inhaler Commonly known as: ATROVENT HFA       TAKE these medications    atorvastatin 80 MG tablet Commonly known as: LIPITOR Take 80 mg by mouth daily.   b complex-vitamin c-folic acid 0.8 MG Tabs  tablet Take 1 tablet by mouth daily.   carvedilol 25 MG tablet Commonly known as: COREG Take 25 mg by mouth 2 (two) times daily.   cinacalcet 30 MG tablet Commonly known as: SENSIPAR Take 30 mg by mouth daily.   clopidogrel 75 MG tablet Commonly known as: PLAVIX Take 75 mg by mouth daily.   DULoxetine 30 MG capsule Commonly known as: CYMBALTA Take 30 mg by mouth daily.   Euthyrox 100 MCG tablet Generic drug: levothyroxine Take 100 mcg by mouth daily.   insulin NPH Human 100 UNIT/ML injection Commonly known as: NOVOLIN N Inject 0.1 mLs (10 Units total) into the skin daily before breakfast. sliding scale. What changed:  how much to take how to take this when to take this   insulin regular 100 units/mL injection Commonly known as: NOVOLIN R Inject 5-20 Units into the skin 3 (three) times daily before meals. Depending on blood glucose level   patiromer 8.4 g packet Commonly known as: VELTASSA Take 1 packet (8.4 g total) by mouth daily.   pentoxifylline 400 MG CR tablet Commonly known as: TRENTAL Take 400 mg by mouth 3 (three) times daily.   sevelamer carbonate 800 MG tablet Commonly known as: RENVELA Take 2,400 mg by mouth 3 (three) times daily.   torsemide 100 MG tablet Commonly known as: DEMADEX Take 100 mg by mouth 3 (three) times a week. What changed: Another medication with the same name was removed. Continue taking this medication, and follow the directions you see here.   traMADol 50 MG tablet Commonly known as: ULTRAM Take 50 mg by mouth every 6 (six) hours as needed.   valACYclovir 1000 MG tablet Commonly known as: VALTREX Take by mouth.   VITAMIN D (CHOLECALCIFEROL) PO Take by mouth.        If you experience worsening of your admission symptoms, develop shortness of breath, life threatening emergency, suicidal or homicidal thoughts you must seek medical attention immediately by calling 911 or calling your MD immediately  if symptoms less  severe.  You Must read complete instructions/literature along with all the possible adverse reactions/side effects for all the Medicines you take and that have been prescribed to you. Take any new Medicines after you have completely understood and accept all the possible adverse reactions/side effects.   Please note  You were cared for by a hospitalist during your hospital stay. If you have any questions about your discharge medications or the care you received while you were in the hospital after you are discharged, you can call the unit and asked to speak with the hospitalist on call if the hospitalist that took care of you is not available. Once you are discharged, your primary care physician will handle any further medical issues. Please note that NO REFILLS for any discharge medications will be authorized once you are discharged, as it is imperative that you return to your primary care physician (or establish a relationship with a primary care physician if you do not have one) for your aftercare needs so that they can reassess your need for medications and monitor your lab values. Today   SUBJECTIVE  Seen at HD--doing well   VITAL SIGNS:  Blood pressure (!) 152/62, pulse 62, temperature 98.4 F (36.9 C), temperature source Oral, resp. rate 17, height '5\' 8"'$  (1.727 m), weight 84.6 kg, SpO2 96 %.  I/O:   Intake/Output Summary (Last 24 hours) at 04/16/2021 1153 Last data filed at 04/16/2021 0757 Gross per 24 hour  Intake 200 ml  Output 100 ml  Net 100 ml    PHYSICAL EXAMINATION:  GENERAL:  70 y.o.-year-old patient lying in the bed with no acute distress.  LUNGS: Normal breath sounds bilaterally, no wheezing, rales,rhonchi or crepitation. No use of accessory muscles of respiration. HD Access+ CARDIOVASCULAR: S1, S2 normal. No murmurs, rubs, or gallops.  ABDOMEN: Soft, non-tender, non-distended. Bowel sounds present. No organomegaly or mass.  EXTREMITIES: No pedal edema, cyanosis, or  clubbing.  NEUROLOGIC: Cranial nerves II through XII are intact. Muscle strength 5/5 in all extremities. Sensation intact. Gait not checked.  PSYCHIATRIC: The patient is alert and oriented x 3.  SKIN: No obvious rash, lesion, or ulcer.   DATA REVIEW:   CBC  Recent Labs  Lab 04/16/21 0741  WBC 7.1  HGB 10.9*  HCT 32.7*  PLT 191    Chemistries  Recent Labs  Lab 04/15/21 0807  NA 136  K 4.3  CL 96*  CO2 27  GLUCOSE 197*  BUN 35*  CREATININE 5.19*  CALCIUM 8.3*  AST 51*  ALT 18  ALKPHOS 100  BILITOT 1.1    Microbiology Results   Recent Results (from the past 240 hour(s))  Resp Panel by RT-PCR (Flu A&B, Covid) Nasopharyngeal Swab     Status: None   Collection Time: 04/14/21 11:51 AM   Specimen: Nasopharyngeal Swab; Nasopharyngeal(NP) swabs in vial transport medium  Result Value Ref Range Status   SARS Coronavirus 2 by RT PCR NEGATIVE NEGATIVE Final    Comment: (NOTE) SARS-CoV-2 target nucleic acids are NOT DETECTED.  The SARS-CoV-2 RNA is generally detectable in upper respiratory specimens during the acute phase of infection. The lowest concentration of SARS-CoV-2 viral copies this assay can detect is 138 copies/mL. A negative result does not preclude SARS-Cov-2 infection and should not be used as the sole basis for treatment or other patient management decisions. A negative result may occur with  improper specimen collection/handling, submission of specimen other than nasopharyngeal swab, presence of viral mutation(s) within the areas targeted by this assay, and inadequate number of viral copies(<138 copies/mL). A negative result must be combined with clinical observations, patient history, and epidemiological information. The expected result is Negative.  Fact Sheet for Patients:  EntrepreneurPulse.com.au  Fact Sheet for Healthcare Providers:  IncredibleEmployment.be  This test is no t yet approved or cleared by the  [place] FDA and  has been authorized for detection and/or diagnosis of SARS-CoV-2 by FDA under an Emergency Use Authorization (EUA). This EUA will remain  in effect (meaning this test can be used) for the duration of the COVID-19 declaration under Section 564(b)(1) of the Act, 21 U.S.C.section 360bbb-3(b)(1), unless the authorization is terminated  or revoked sooner.       Influenza A by PCR NEGATIVE NEGATIVE Final   Influenza B by PCR NEGATIVE NEGATIVE Final    Comment: (NOTE) The Xpert Xpress SARS-CoV-2/FLU/RSV plus assay is intended as an aid in the diagnosis of influenza from Nasopharyngeal swab specimens and should not be used as a sole basis for treatment. Nasal washings and aspirates are unacceptable for Xpert Xpress SARS-CoV-2/FLU/RSV testing.  Fact Sheet for Patients: EntrepreneurPulse.com.au  Fact Sheet for Healthcare Providers: IncredibleEmployment.be  This test is not yet approved or cleared by the [place] FDA and has been authorized for detection and/or diagnosis of SARS-CoV-2 by FDA under an Emergency Use Authorization (EUA). This EUA will remain in effect (meaning this test can be used) for the duration of the COVID-19 declaration under Section 564(b)(1) of the Act, 21 U.S.C. section 360bbb-3(b)(1), unless the authorization is terminated or revoked.  Performed at [facility], [address] [place], [place] [address]   Blood culture (routine x 2)     Status: None (Preliminary result)   Collection Time: 04/14/21 12:20 PM   Specimen: BLOOD  Result Value Ref Range Status   Specimen Description BLOOD RIGHT ANTECUBITAL  Final   Special Requests   Final    BOTTLES DRAWN AEROBIC AND ANAEROBIC Blood Culture adequate volume   Culture   Final    NO GROWTH 2 DAYS Performed at [facility], [address] [place], [place] [address]    Report Status PENDING  Incomplete  Blood culture (routine x 2)      Status: None (Preliminary result)   Collection Time: 04/14/21 12:51 PM   Specimen: BLOOD  Result Value Ref Range Status   Specimen Description BLOOD BLOOD RIGHT WRIST  Final   Special Requests   Final    BOTTLES DRAWN AEROBIC AND ANAEROBIC Blood Culture adequate volume   Culture   Final    NO GROWTH 2 DAYS Performed at [facility], [address] [place], [place] [address]    Report Status PENDING  Incomplete    RADIOLOGY:  ECHOCARDIOGRAM COMPLETE  Result Date: 04/15/2021    ECHOCARDIOGRAM REPORT   Patient Name:   AUNDREA DEMMONS Date of Exam: 04/14/2021 Medical Rec #:  [id number]      Height:       68.0 in Accession #:    [id number]     Weight:       197.4 lb Date of Birth:  12/14/1950      BSA:          2.032 m Patient Age:    8 years       BP:           146/74 mmHg Patient Gender: M              HR:           68 bpm. Exam Location:  [facility] Procedure: 2D Echo, Cardiac Doppler and Color Doppler Indications:     [id number] Acute Systolic CHF  History:         Patient has prior history of Echocardiogram examinations, most                  recent 06/21/2019. CHF, CAD, COPD, Arrythmias:LBBB; Risk                  Factors:Diabetes. History of COVID-19.  Sonographer:     Cresenciano Lick RDCS Referring Phys:  [id number] Arvil Chaco Diagnosing Phys: Nelva Bush MD IMPRESSIONS  1. Left ventricular ejection fraction, by estimation, is 25 to 30%. The left ventricle has severely decreased function. The left ventricle demonstrates regional wall motion abnormalities (see scoring diagram/findings for description). The left ventricular internal cavity size was borderline dilated. Left ventricular diastolic parameters are consistent with Grade II diastolic dysfunction (pseudonormalization). Elevated left atrial pressure. There is global hypokinesis with akinesis of the inferior and inferolateral walls.  2. Right ventricular systolic function is  moderately reduced. The right ventricular size  is normal. Tricuspid regurgitation signal is inadequate for assessing PA pressure.  3. Left atrial size was mildly dilated.  4. The mitral valve is normal in structure. Mild to moderate mitral valve regurgitation; degree of regurgitation may be underestimated by jet eccentricity.  5. The aortic valve is tricuspid. There is mild calcification of the aortic valve. There is moderate thickening of the aortic valve. Aortic valve regurgitation is not visualized. Mild to moderate aortic valve sclerosis/calcification is present, without any evidence of aortic stenosis.  6. The inferior vena cava is normal in size with <50% respiratory variability, suggesting right atrial pressure of 8 mmHg. FINDINGS  Left Ventricle: Left ventricular ejection fraction, by estimation, is 25 to 30%. The left ventricle has severely decreased function. The left ventricle demonstrates regional wall motion abnormalities. The left ventricular internal cavity size was borderline dilated. There is no left ventricular hypertrophy. Left ventricular diastolic parameters are consistent with Grade II diastolic dysfunction (pseudonormalization). Elevated left atrial pressure.  LV Wall Scoring: There is global hypokinesis with akinesis of the inferior and inferolateral walls. Right Ventricle: The right ventricular size is normal. No increase in right ventricular wall thickness. Right ventricular systolic function is moderately reduced. Tricuspid regurgitation signal is inadequate for assessing PA pressure. Left Atrium: Left atrial size was mildly dilated. Right Atrium: Right atrial size was normal in size. Pericardium: There is no evidence of pericardial effusion. Mitral Valve: The mitral valve is normal in structure. Mild to moderate mitral valve regurgitation. Tricuspid Valve: The tricuspid valve is not well visualized. Tricuspid valve regurgitation is trivial. Aortic Valve: The aortic valve is tricuspid. There is mild calcification of the aortic valve.  There is moderate thickening of the aortic valve. Aortic valve regurgitation is not visualized. Mild to moderate aortic valve sclerosis/calcification is present, without any evidence of aortic stenosis. Pulmonic Valve: The pulmonic valve was not well visualized. Pulmonic valve regurgitation is not visualized. No evidence of pulmonic stenosis. Aorta: The aortic root and ascending aorta are structurally normal, with no evidence of dilitation. Pulmonary Artery: The pulmonary artery is not well seen. Venous: The inferior vena cava is normal in size with less than 50% respiratory variability, suggesting right atrial pressure of 8 mmHg. IAS/Shunts: The interatrial septum was not well visualized.  LEFT VENTRICLE PLAX 2D LVIDd:         5.54 cm   Diastology LVIDs:         4.67 cm   LV e' medial:    3.70 cm/s LV PW:         0.88 cm   LV E/e' medial:  33.8 LV IVS:        0.81 cm   LV e' lateral:   8.05 cm/s LVOT diam:     2.10 cm   LV E/e' lateral: 15.5 LV SV:         47 LV SV Index:   23 LVOT Area:     3.46 cm  RIGHT VENTRICLE            IVC RV Basal diam:  3.71 cm    IVC diam: 1.90 cm RV S prime:     9.36 cm/s TAPSE (M-mode): 1.2 cm LEFT ATRIUM             Index        RIGHT ATRIUM           Index LA diam:        4.50 cm 2.21 cm/m  RA Area:     16.80 cm LA Vol (A2C):   60.0 ml 29.52 ml/m  RA Volume:   48.50 ml  23.86 ml/m LA Vol (A4C):   67.4 ml 33.16 ml/m LA Biplane Vol: 63.9 ml 31.44 ml/m  AORTIC VALVE LVOT Vmax:   75.80 cm/s LVOT Vmean:  52.200 cm/s LVOT VTI:    0.137 m  AORTA Ao Root diam: 3.30 cm Ao Asc diam:  3.40 cm MITRAL VALVE MV Area (PHT): 3.85 cm     SHUNTS MV Decel Time: 197 msec     Systemic VTI:  0.14 m MV E velocity: 125.00 cm/s  Systemic Diam: 2.10 cm MV A velocity: 68.90 cm/s MV E/A ratio:  1.81 Harrell Gave End MD Electronically signed by Nelva Bush MD Signature Date/Time: 04/15/2021/7:49:09 AM    Final      CODE STATUS:     Code Status Orders  (From admission, onward)            Start     Ordered   04/14/21 1850  Full code  Continuous        04/14/21 1850           Code Status History     Date Active Date Inactive Code Status Order ID Comments User Context   03/04/2021 2112 03/05/2021 1927 Full Code [id number]  Ivor Costa, MD Inpatient   12/15/2020 1402 12/17/2020 0120 Full Code [id number]  Ivor Costa, MD ED   07/22/2019 2333 07/25/2019 2210 DNR [id number]  Athena Masse, MD ED   07/22/2019 2258 07/22/2019 2333 Full Code [id number]  Athena Masse, MD ED   06/21/2019 0544 06/28/2019 2203 DNR [id number]  Athena Masse, MD ED   06/21/2019 0341 06/21/2019 0544 Full Code [id number]  Athena Masse, MD ED        TOTAL TIME TAKING CARE OF THIS PATIENT: 40 minutes.    Fritzi Mandes M.D  [facility]    CC: Primary care physician; [place]

## 2021-04-16 NOTE — Progress Notes (Signed)
ANTICOAGULATION CONSULT NOTE  Pharmacy Consult for heparin infusion initiation & monitoring1 Indication: chest pain/ACS   Patient Measurements: Height: '5\' 8"'$  (172.7 cm) Weight: 89.5 kg (197 lb 6.4 oz) IBW/kg (Calculated) : 68.4 Heparin Dosing Weight: 85.8 kg  Vital Signs: Temp: 97.4 F (36.3 C) (10/20 0645) Temp Source: Oral (10/20 0645) BP: 158/73 (10/20 0735) Pulse Rate: 62 (10/20 0735)  Labs: Recent Labs    04/14/21 1110 04/14/21 1311 04/14/21 1616 04/14/21 1824 04/15/21 0807 04/15/21 1913 04/16/21 0741  HGB 11.6*  --   --   --  11.2*  --  10.9*  HCT 35.6*  --   --   --  34.2*  --  32.7*  PLT 222  --   --   --  184  --  191  LABPROT  --   --   --   --  14.4  --   --   INR  --   --   --   --  1.1  --   --   HEPARINUNFRC  --   --   --   --  0.74* 0.62 0.51  CREATININE 6.78*  --   --   --  5.19*  --   --   TROPONINIHS 61*   < > 7,235* 11,648* 11,461*  --   --    < > = values in this interval not displayed.     Estimated Creatinine Clearance: 14.8 mL/min (A) (by C-G formula based on SCr of 5.19 mg/dL (H)).   Medical History: Past Medical History:  Diagnosis Date   CAD (coronary artery disease)    CHF (congestive heart failure) (HCC)    CKD (chronic kidney disease), stage III (HCC)    COPD (chronic obstructive pulmonary disease) ([place])    COVID-19    Diabetes mellitus without complication (HCC)    HFrEF (heart failure with reduced ejection fraction) (HCC)    LBBB (left bundle branch block)    Venous ulcer ([place])     [name] with a hx of CAD s/p 4-vessel CABG with LIMA-LAD, SVG-diagonal, SVG to OM, SVG to RCA with non-STEMI in 07/2015 status post PCI/DES. Pharmacy asked to initiate and monitor Heparin for ACS.  No anticoagulants except  clopidogrel PTA per current med list. He is being followed by cardiology and the current plan is to continue heparin through 04/16/21 at 1830 per cardiology notes.  Goal of Therapy:  Heparin level 0.3-0.7  units/ml Monitor platelets by anticoagulation protocol: Yes   Plan:  Heparin level is therapeutic x2 Continue heparin infusion at 1300 units/hr  Check HL with AM labs Continue to monitor H&H and platelets  Wallace Cogliano A Johnta Couts 04/16/2021,8:36 AM

## 2021-04-17 ENCOUNTER — Other Ambulatory Visit: Payer: Self-pay

## 2021-04-17 DIAGNOSIS — I5020 Unspecified systolic (congestive) heart failure: Secondary | ICD-10-CM | POA: Diagnosis not present

## 2021-04-17 DIAGNOSIS — J40 Bronchitis, not specified as acute or chronic: Secondary | ICD-10-CM | POA: Diagnosis not present

## 2021-04-17 DIAGNOSIS — I2510 Atherosclerotic heart disease of native coronary artery without angina pectoris: Secondary | ICD-10-CM | POA: Diagnosis not present

## 2021-04-17 DIAGNOSIS — Z23 Encounter for immunization: Secondary | ICD-10-CM | POA: Diagnosis not present

## 2021-04-17 NOTE — Patient Outreach (Signed)
[facility]) Care Management  04/17/2021  Bryce Garcia [birth year] [id number]   Received referral for [facility] after admission to [facility].  Discharge date: 04/16/21   Spoke with patient he reports doing well.  He verifies that his PCP is Rolland Porter at [facility].  He reports no medication issues.  Reports seeing cardiologist yesterday and will be making appointment with PCP.  No concerns.  Plan: RN CM will close case as patient has non-[facility] PCP.  Jone Baseman, RN, MSN [facility] Care Management Coordinator Direct Line [phone] Cell [phone] Toll Free: [phone]  Fax: [phone]

## 2021-04-18 DIAGNOSIS — E108 Type 1 diabetes mellitus with unspecified complications: Secondary | ICD-10-CM | POA: Diagnosis not present

## 2021-04-18 DIAGNOSIS — N186 End stage renal disease: Secondary | ICD-10-CM | POA: Diagnosis not present

## 2021-04-18 DIAGNOSIS — Z992 Dependence on renal dialysis: Secondary | ICD-10-CM | POA: Diagnosis not present

## 2021-04-18 DIAGNOSIS — N2581 Secondary hyperparathyroidism of renal origin: Secondary | ICD-10-CM | POA: Diagnosis not present

## 2021-04-19 LAB — CULTURE, BLOOD (ROUTINE X 2)
Culture: NO GROWTH
Culture: NO GROWTH
Special Requests: ADEQUATE
Special Requests: ADEQUATE

## 2021-04-21 DIAGNOSIS — N2581 Secondary hyperparathyroidism of renal origin: Secondary | ICD-10-CM | POA: Diagnosis not present

## 2021-04-21 DIAGNOSIS — N186 End stage renal disease: Secondary | ICD-10-CM | POA: Diagnosis not present

## 2021-04-21 DIAGNOSIS — Z992 Dependence on renal dialysis: Secondary | ICD-10-CM | POA: Diagnosis not present

## 2021-04-22 ENCOUNTER — Telehealth: Payer: Self-pay | Admitting: Family

## 2021-04-22 ENCOUNTER — Ambulatory Visit: Payer: Medicare HMO | Admitting: Family

## 2021-04-22 DIAGNOSIS — E119 Type 2 diabetes mellitus without complications: Secondary | ICD-10-CM | POA: Diagnosis not present

## 2021-04-22 DIAGNOSIS — B351 Tinea unguium: Secondary | ICD-10-CM | POA: Diagnosis not present

## 2021-04-22 NOTE — Telephone Encounter (Signed)
Patient did not show for his Heart Failure Clinic appointment on 04/22/21.  Will attempt to reschedule.

## 2021-04-23 DIAGNOSIS — Z992 Dependence on renal dialysis: Secondary | ICD-10-CM | POA: Diagnosis not present

## 2021-04-23 DIAGNOSIS — N186 End stage renal disease: Secondary | ICD-10-CM | POA: Diagnosis not present

## 2021-04-23 DIAGNOSIS — N2581 Secondary hyperparathyroidism of renal origin: Secondary | ICD-10-CM | POA: Diagnosis not present

## 2021-04-25 DIAGNOSIS — N2581 Secondary hyperparathyroidism of renal origin: Secondary | ICD-10-CM | POA: Diagnosis not present

## 2021-04-25 DIAGNOSIS — Z992 Dependence on renal dialysis: Secondary | ICD-10-CM | POA: Diagnosis not present

## 2021-04-25 DIAGNOSIS — N186 End stage renal disease: Secondary | ICD-10-CM | POA: Diagnosis not present

## 2021-04-27 DIAGNOSIS — E1022 Type 1 diabetes mellitus with diabetic chronic kidney disease: Secondary | ICD-10-CM | POA: Diagnosis not present

## 2021-04-27 DIAGNOSIS — N186 End stage renal disease: Secondary | ICD-10-CM | POA: Diagnosis not present

## 2021-04-27 DIAGNOSIS — Z992 Dependence on renal dialysis: Secondary | ICD-10-CM | POA: Diagnosis not present

## 2021-04-28 DIAGNOSIS — R9431 Abnormal electrocardiogram [ECG] [EKG]: Secondary | ICD-10-CM | POA: Diagnosis not present

## 2021-04-28 DIAGNOSIS — I2510 Atherosclerotic heart disease of native coronary artery without angina pectoris: Secondary | ICD-10-CM | POA: Diagnosis not present

## 2021-04-28 DIAGNOSIS — I214 Non-ST elevation (NSTEMI) myocardial infarction: Secondary | ICD-10-CM | POA: Diagnosis not present

## 2021-04-28 DIAGNOSIS — N185 Chronic kidney disease, stage 5: Secondary | ICD-10-CM | POA: Diagnosis not present

## 2021-04-28 DIAGNOSIS — R7989 Other specified abnormal findings of blood chemistry: Secondary | ICD-10-CM | POA: Diagnosis not present

## 2021-04-28 DIAGNOSIS — R0602 Shortness of breath: Secondary | ICD-10-CM | POA: Diagnosis not present

## 2021-04-28 DIAGNOSIS — Z992 Dependence on renal dialysis: Secondary | ICD-10-CM | POA: Diagnosis not present

## 2021-04-28 DIAGNOSIS — E039 Hypothyroidism, unspecified: Secondary | ICD-10-CM | POA: Diagnosis not present

## 2021-04-28 DIAGNOSIS — E7800 Pure hypercholesterolemia, unspecified: Secondary | ICD-10-CM | POA: Diagnosis not present

## 2021-04-28 DIAGNOSIS — R944 Abnormal results of kidney function studies: Secondary | ICD-10-CM | POA: Diagnosis not present

## 2021-04-28 DIAGNOSIS — R600 Localized edema: Secondary | ICD-10-CM | POA: Diagnosis not present

## 2021-04-28 DIAGNOSIS — I5020 Unspecified systolic (congestive) heart failure: Secondary | ICD-10-CM | POA: Diagnosis not present

## 2021-04-28 DIAGNOSIS — J811 Chronic pulmonary edema: Secondary | ICD-10-CM | POA: Diagnosis not present

## 2021-04-28 DIAGNOSIS — Z20822 Contact with and (suspected) exposure to covid-19: Secondary | ICD-10-CM | POA: Diagnosis not present

## 2021-04-28 DIAGNOSIS — I252 Old myocardial infarction: Secondary | ICD-10-CM | POA: Diagnosis not present

## 2021-04-28 DIAGNOSIS — I132 Hypertensive heart and chronic kidney disease with heart failure and with stage 5 chronic kidney disease, or end stage renal disease: Secondary | ICD-10-CM | POA: Diagnosis not present

## 2021-04-28 DIAGNOSIS — E1022 Type 1 diabetes mellitus with diabetic chronic kidney disease: Secondary | ICD-10-CM | POA: Diagnosis not present

## 2021-04-28 DIAGNOSIS — R059 Cough, unspecified: Secondary | ICD-10-CM | POA: Diagnosis not present

## 2021-04-28 DIAGNOSIS — N186 End stage renal disease: Secondary | ICD-10-CM | POA: Diagnosis not present

## 2021-04-28 DIAGNOSIS — G4700 Insomnia, unspecified: Secondary | ICD-10-CM | POA: Diagnosis not present

## 2021-04-29 DIAGNOSIS — N185 Chronic kidney disease, stage 5: Secondary | ICD-10-CM | POA: Diagnosis not present

## 2021-04-29 DIAGNOSIS — E1022 Type 1 diabetes mellitus with diabetic chronic kidney disease: Secondary | ICD-10-CM | POA: Diagnosis not present

## 2021-04-29 DIAGNOSIS — I5023 Acute on chronic systolic (congestive) heart failure: Secondary | ICD-10-CM | POA: Diagnosis not present

## 2021-04-29 DIAGNOSIS — I2510 Atherosclerotic heart disease of native coronary artery without angina pectoris: Secondary | ICD-10-CM | POA: Diagnosis not present

## 2021-04-29 DIAGNOSIS — N186 End stage renal disease: Secondary | ICD-10-CM | POA: Diagnosis not present

## 2021-04-29 DIAGNOSIS — Z992 Dependence on renal dialysis: Secondary | ICD-10-CM | POA: Diagnosis not present

## 2021-04-29 DIAGNOSIS — I132 Hypertensive heart and chronic kidney disease with heart failure and with stage 5 chronic kidney disease, or end stage renal disease: Secondary | ICD-10-CM | POA: Diagnosis not present

## 2021-04-30 DIAGNOSIS — N2581 Secondary hyperparathyroidism of renal origin: Secondary | ICD-10-CM | POA: Diagnosis not present

## 2021-04-30 DIAGNOSIS — Z992 Dependence on renal dialysis: Secondary | ICD-10-CM | POA: Diagnosis not present

## 2021-04-30 DIAGNOSIS — N186 End stage renal disease: Secondary | ICD-10-CM | POA: Diagnosis not present

## 2021-04-30 DIAGNOSIS — E1022 Type 1 diabetes mellitus with diabetic chronic kidney disease: Secondary | ICD-10-CM | POA: Diagnosis not present

## 2021-05-28 DEATH — deceased

## 2021-06-01 LAB — BLOOD GAS, VENOUS
Acid-base deficit: 0.3 mmol/L (ref 0.0–2.0)
Bicarbonate: 26.3 mmol/L (ref 20.0–28.0)
O2 Saturation: 22.4 %
Patient temperature: 37
pCO2, Ven: 51 mmHg (ref 44.0–60.0)
pH, Ven: 7.32 (ref 7.250–7.430)

## 2023-01-15 IMAGING — CT CT ABD-PELV W/O CM
2 of 3 series · 16 of 46 positions shown, 18 images · non-contrast
Comparison: 10/27/2013 CT angiogram of the chest, abdomen and
pelvis.

CLINICAL DATA: Found unresponsive with hypoglycemia.

EXAM:
CT ABDOMEN AND PELVIS WITHOUT CONTRAST
TECHNIQUE: Multidetector CT imaging of the abdomen and pelvis was performed
following the standard protocol without IV contrast.

[Series 2: routine abd/pel wo · axial · 0.93mm/px · z∈[-430,-10]mm · 13 of 98 slices shown, 15 images]
[im 7/98  soft-tissue]
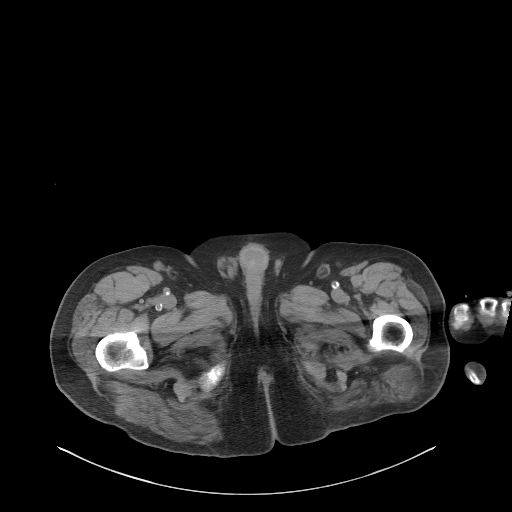
[im 7/98  bone]
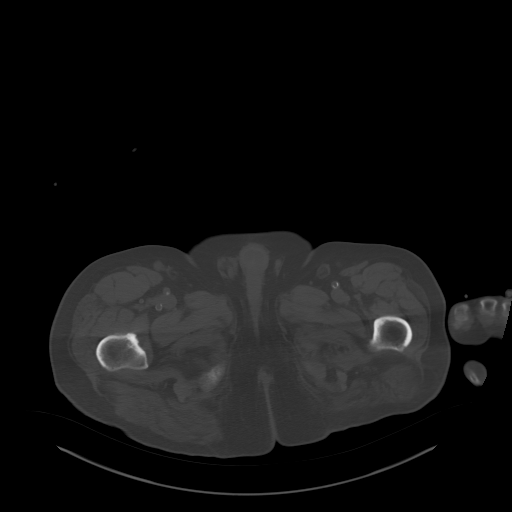
[im 13/98  soft-tissue]
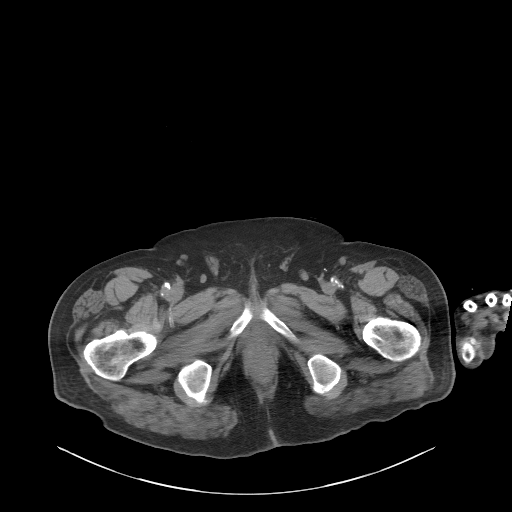
[im 19/98  soft-tissue]
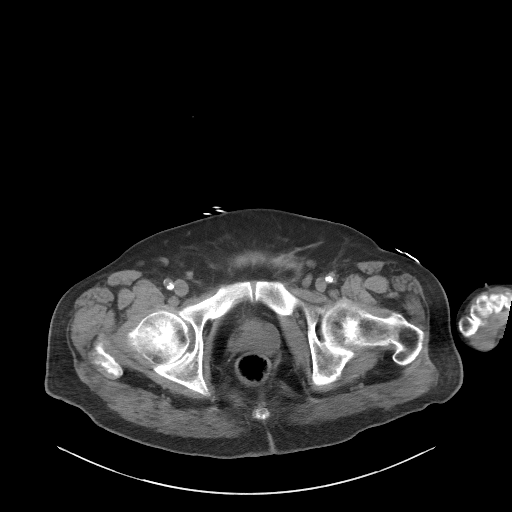
[im 29/98  soft-tissue]
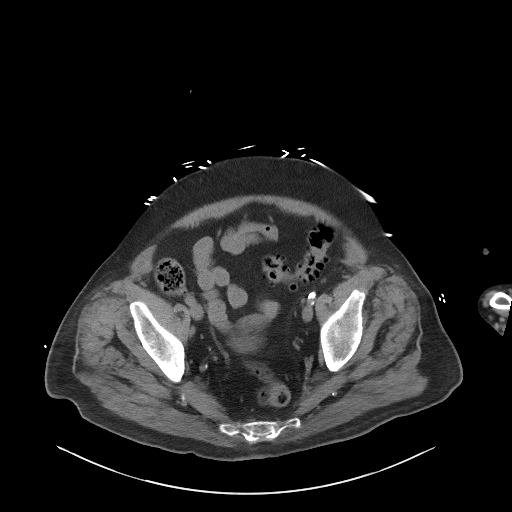
[im 35/98  soft-tissue]
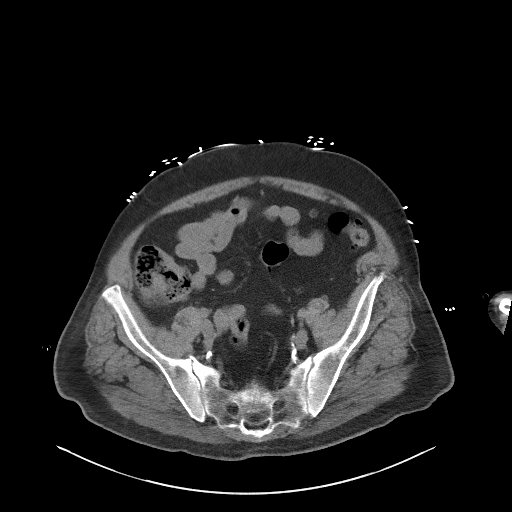
[im 41/98  soft-tissue]
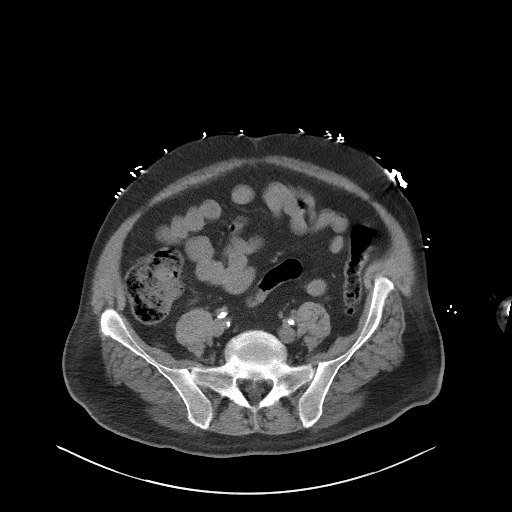
[im 51/98  soft-tissue]
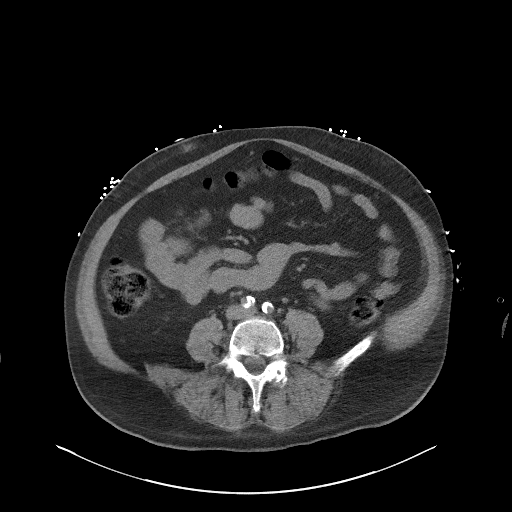
[im 57/98  soft-tissue]
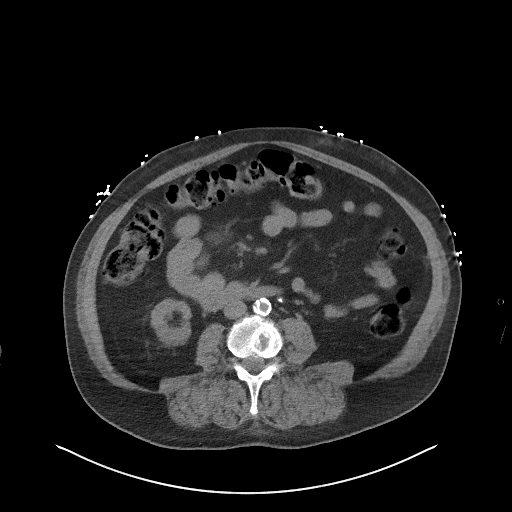
[im 63/98  soft-tissue]
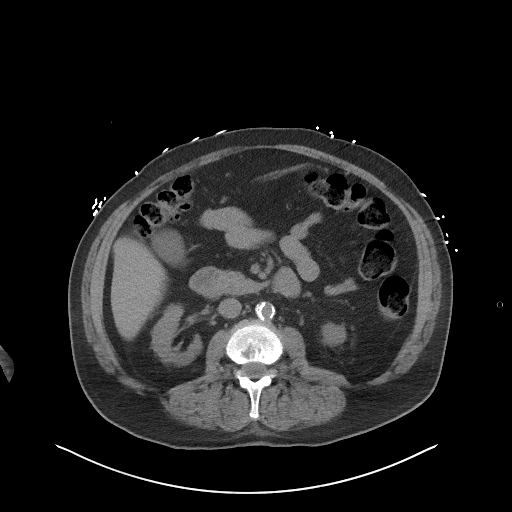
[im 63/98  bone]
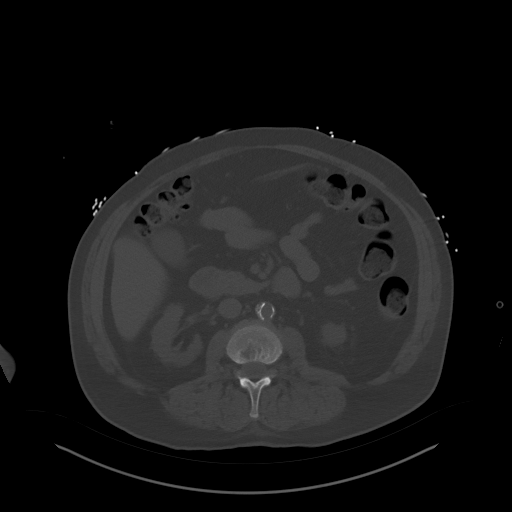
[im 69/98  soft-tissue]
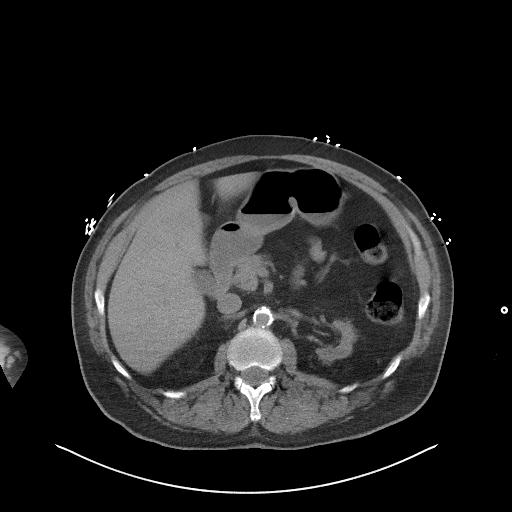
[im 79/98  soft-tissue]
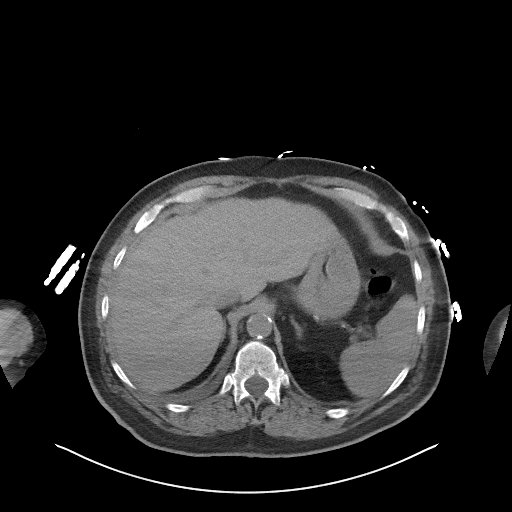
[im 85/98  soft-tissue]
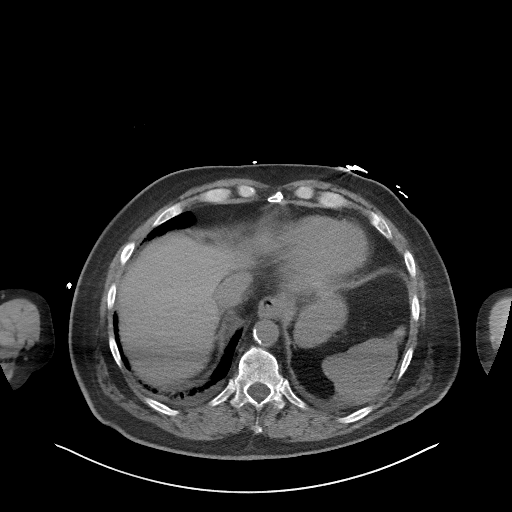
[im 91/98  soft-tissue]
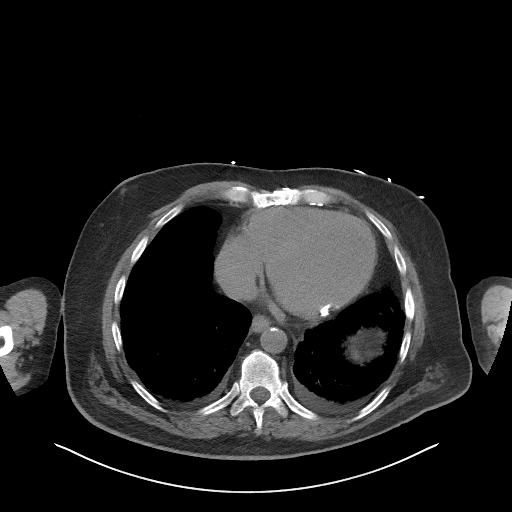

[Series 5: coronal st · coronal · 0.81mm/px · 3 of 105 slices shown]
[im 35/105  soft-tissue]
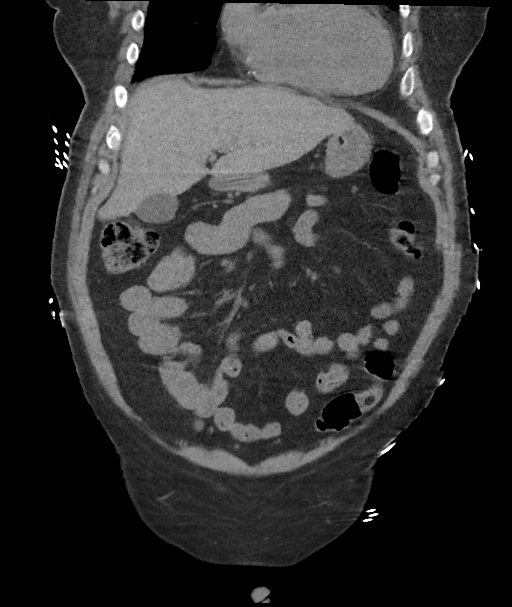
[im 47/105  soft-tissue]
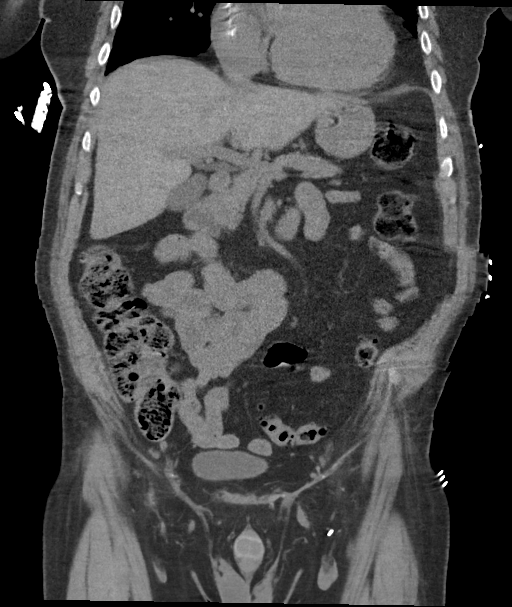
[im 58/105  soft-tissue]
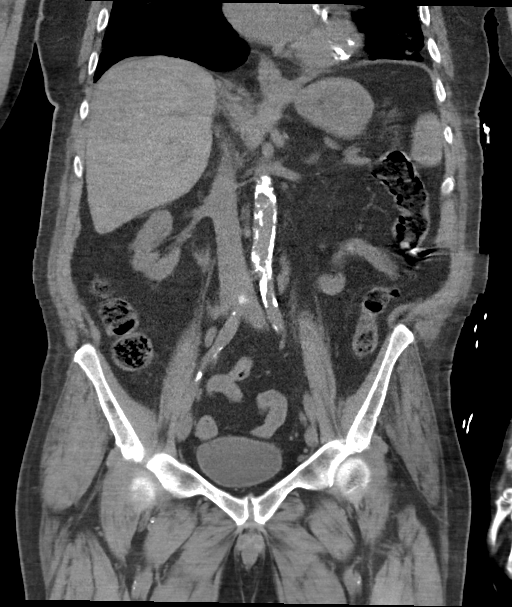

[16 of 46 positions shown; findings below may reference images not displayed]

FINDINGS: Lower chest: Trace dependent bilateral pleural effusions, left
greater than right, with mild hypoventilatory changes at the
dependent lung bases. Coronary atherosclerosis. Tip of superior
approach central venous catheter seen near the cavoatrial junction.

Hepatobiliary: Normal liver size. No liver mass. Normal gallbladder
with no radiopaque cholelithiasis. No biliary ductal dilatation.

Pancreas: Normal, with no mass or duct dilation.

Spleen: Normal size. No mass.

Adrenals/Urinary Tract: Normal adrenals. No renal stones. No
hydronephrosis. No contour deforming renal masses. Normal bladder.

Stomach/Bowel: Normal non-distended stomach. Normal caliber small
bowel with no small bowel wall thickening. Normal appendix. Mild
left colonic diverticulosis with no large bowel wall thickening or
significant pericolonic fat stranding.

Vascular/Lymphatic: Atherosclerotic nonaneurysmal abdominal aorta.
No pathologically enlarged lymph nodes in the abdomen or pelvis.

Reproductive: Normal size prostate.

Other: No pneumoperitoneum, ascites or focal fluid collection. Small
fat containing right inguinal hernia.

Musculoskeletal: No aggressive appearing focal osseous lesions.
Moderate lumbar degenerative disc disease with large Schmorl's nodes
at the superior and inferior L3 vertebral body. Intact lower
sternotomy wires.
IMPRESSION: 1. No acute abnormality. No evidence of bowel obstruction or acute
bowel inflammation. Mild left colonic diverticulosis, with no
evidence of acute diverticulitis.
2. Trace dependent bilateral pleural effusions, left greater than
right.
3. Small fat containing right inguinal hernia.
4. Coronary atherosclerosis.
5. Aortic Atherosclerosis ([id number]).
